# Patient Record
Sex: Male | Born: 1945 | Race: White | Hispanic: No | Marital: Married | Smoking: Never smoker
Health system: Southern US, Community
[De-identification: ages and names within clinical notes are randomized; demographics above are authoritative.]

## PROBLEM LIST (undated history)

## (undated) DIAGNOSIS — M51369 Other intervertebral disc degeneration, lumbar region without mention of lumbar back pain or lower extremity pain: Secondary | ICD-10-CM

## (undated) DIAGNOSIS — I10 Essential (primary) hypertension: Secondary | ICD-10-CM

## (undated) DIAGNOSIS — M5136 Other intervertebral disc degeneration, lumbar region: Secondary | ICD-10-CM

## (undated) DIAGNOSIS — I2510 Atherosclerotic heart disease of native coronary artery without angina pectoris: Secondary | ICD-10-CM

## (undated) DIAGNOSIS — N400 Enlarged prostate without lower urinary tract symptoms: Secondary | ICD-10-CM

## (undated) DIAGNOSIS — E785 Hyperlipidemia, unspecified: Secondary | ICD-10-CM

## (undated) DIAGNOSIS — K635 Polyp of colon: Secondary | ICD-10-CM

## (undated) DIAGNOSIS — E119 Type 2 diabetes mellitus without complications: Secondary | ICD-10-CM

## (undated) DIAGNOSIS — L570 Actinic keratosis: Secondary | ICD-10-CM

## (undated) DIAGNOSIS — K219 Gastro-esophageal reflux disease without esophagitis: Secondary | ICD-10-CM

## (undated) DIAGNOSIS — I700 Atherosclerosis of aorta: Secondary | ICD-10-CM

## (undated) DIAGNOSIS — N319 Neuromuscular dysfunction of bladder, unspecified: Secondary | ICD-10-CM

## (undated) DIAGNOSIS — K449 Diaphragmatic hernia without obstruction or gangrene: Secondary | ICD-10-CM

## (undated) DIAGNOSIS — N183 Chronic kidney disease, stage 3 unspecified: Secondary | ICD-10-CM

## (undated) DIAGNOSIS — K760 Fatty (change of) liver, not elsewhere classified: Secondary | ICD-10-CM

## (undated) DIAGNOSIS — H532 Diplopia: Secondary | ICD-10-CM

## (undated) DIAGNOSIS — H5034 Intermittent alternating exotropia: Secondary | ICD-10-CM

## (undated) DIAGNOSIS — M1990 Unspecified osteoarthritis, unspecified site: Secondary | ICD-10-CM

## (undated) HISTORY — PX: CHOLECYSTECTOMY: SHX55

## (undated) HISTORY — PX: EYE SURGERY: SHX253

## (undated) HISTORY — DX: Polyp of colon: K63.5

## (undated) HISTORY — DX: Actinic keratosis: L57.0

---
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
## 1980-04-02 HISTORY — PX: GASTRIC RESTRICTION SURGERY: SHX653

## 1980-04-02 HISTORY — PX: OTHER SURGICAL HISTORY: SHX169

## 1998-04-02 HISTORY — PX: EYE SURGERY: SHX253

## 2004-04-15 ENCOUNTER — Ambulatory Visit: Payer: Self-pay | Admitting: Internal Medicine

## 2006-08-01 DIAGNOSIS — D239 Other benign neoplasm of skin, unspecified: Secondary | ICD-10-CM

## 2006-08-01 HISTORY — DX: Other benign neoplasm of skin, unspecified: D23.9

## 2009-07-13 ENCOUNTER — Ambulatory Visit: Payer: Self-pay | Admitting: Gastroenterology

## 2009-12-20 ENCOUNTER — Ambulatory Visit: Payer: Self-pay | Admitting: Internal Medicine

## 2010-02-09 ENCOUNTER — Ambulatory Visit: Payer: Self-pay | Admitting: Pain Medicine

## 2010-03-21 ENCOUNTER — Ambulatory Visit: Payer: Self-pay | Admitting: Pain Medicine

## 2013-10-01 DIAGNOSIS — N138 Other obstructive and reflux uropathy: Secondary | ICD-10-CM | POA: Insufficient documentation

## 2013-10-01 DIAGNOSIS — N401 Enlarged prostate with lower urinary tract symptoms: Secondary | ICD-10-CM

## 2013-10-01 DIAGNOSIS — E119 Type 2 diabetes mellitus without complications: Secondary | ICD-10-CM | POA: Insufficient documentation

## 2013-10-01 DIAGNOSIS — M4800 Spinal stenosis, site unspecified: Secondary | ICD-10-CM | POA: Insufficient documentation

## 2013-10-01 DIAGNOSIS — I10 Essential (primary) hypertension: Secondary | ICD-10-CM | POA: Insufficient documentation

## 2014-07-14 ENCOUNTER — Ambulatory Visit
Admit: 2014-07-14 | Disposition: A | Discharge status: Home / Self Care | Payer: Self-pay | Attending: Physical Medicine and Rehabilitation | Admitting: Physical Medicine and Rehabilitation

## 2014-10-19 DIAGNOSIS — M5116 Intervertebral disc disorders with radiculopathy, lumbar region: Secondary | ICD-10-CM | POA: Insufficient documentation

## 2014-10-19 DIAGNOSIS — M48061 Spinal stenosis, lumbar region without neurogenic claudication: Secondary | ICD-10-CM | POA: Insufficient documentation

## 2014-10-19 DIAGNOSIS — M5136 Other intervertebral disc degeneration, lumbar region: Secondary | ICD-10-CM | POA: Insufficient documentation

## 2015-02-04 ENCOUNTER — Other Ambulatory Visit: Payer: Self-pay | Admitting: Neurosurgery

## 2015-02-04 DIAGNOSIS — G9519 Other vascular myelopathies: Secondary | ICD-10-CM

## 2015-02-04 DIAGNOSIS — M48062 Spinal stenosis, lumbar region with neurogenic claudication: Secondary | ICD-10-CM

## 2015-03-21 ENCOUNTER — Ambulatory Visit
Admission: RE | Admit: 2015-03-21 | Discharge: 2015-03-21 | Disposition: A | Discharge status: Home / Self Care | Payer: Federal, State, Local not specified - PPO | Source: Ambulatory Visit | Attending: Neurosurgery | Admitting: Neurosurgery

## 2015-03-21 VITALS — BP 146/81 | HR 83

## 2015-03-21 DIAGNOSIS — G9519 Other vascular myelopathies: Secondary | ICD-10-CM

## 2015-03-21 DIAGNOSIS — M48061 Spinal stenosis, lumbar region without neurogenic claudication: Secondary | ICD-10-CM

## 2015-03-21 DIAGNOSIS — M48062 Spinal stenosis, lumbar region with neurogenic claudication: Secondary | ICD-10-CM

## 2015-03-21 MED ORDER — DIAZEPAM 5 MG PO TABS
5.0000 mg | ORAL_TABLET | Freq: Once | ORAL | Status: AC
  Administered 2015-03-21: 5 mg via ORAL

## 2015-03-21 MED ORDER — MEPERIDINE HCL 100 MG/ML IJ SOLN
75.0000 mg | Freq: Once | INTRAMUSCULAR | Status: AC
  Administered 2015-03-21: 75 mg via INTRAMUSCULAR

## 2015-03-21 MED ORDER — ONDANSETRON HCL 4 MG/2ML IJ SOLN
4.0000 mg | Freq: Once | INTRAMUSCULAR | Status: AC
  Administered 2015-03-21: 4 mg via INTRAMUSCULAR

## 2015-03-21 MED ORDER — IOHEXOL 180 MG/ML  SOLN
15.0000 mL | Freq: Once | INTRAMUSCULAR | Status: AC | PRN
Start: 2015-03-21 — End: 2015-03-21
  Administered 2015-03-21: 15 mL via INTRATHECAL

## 2015-03-21 NOTE — Discharge Instructions (Signed)

## 2015-04-03 HISTORY — PX: BACK SURGERY: SHX140

## 2015-04-14 ENCOUNTER — Other Ambulatory Visit: Payer: Self-pay | Admitting: Neurosurgery

## 2015-04-27 ENCOUNTER — Encounter (HOSPITAL_COMMUNITY): Payer: Self-pay

## 2015-04-27 ENCOUNTER — Other Ambulatory Visit (HOSPITAL_COMMUNITY): Payer: Self-pay | Admitting: *Deleted

## 2015-04-27 ENCOUNTER — Encounter (HOSPITAL_COMMUNITY)
Admission: RE | Admit: 2015-04-27 | Discharge: 2015-04-27 | Disposition: A | Discharge status: Home / Self Care | Payer: Medicare Other | Source: Ambulatory Visit | Attending: Physical Medicine and Rehabilitation | Admitting: Physical Medicine and Rehabilitation

## 2015-04-27 DIAGNOSIS — M4316 Spondylolisthesis, lumbar region: Secondary | ICD-10-CM | POA: Diagnosis not present

## 2015-04-27 DIAGNOSIS — Z01818 Encounter for other preprocedural examination: Secondary | ICD-10-CM | POA: Diagnosis not present

## 2015-04-27 DIAGNOSIS — I491 Atrial premature depolarization: Secondary | ICD-10-CM | POA: Insufficient documentation

## 2015-04-27 DIAGNOSIS — Z01812 Encounter for preprocedural laboratory examination: Secondary | ICD-10-CM | POA: Insufficient documentation

## 2015-04-27 DIAGNOSIS — Z0183 Encounter for blood typing: Secondary | ICD-10-CM | POA: Diagnosis not present

## 2015-04-27 HISTORY — DX: Essential (primary) hypertension: I10

## 2015-04-27 HISTORY — DX: Type 2 diabetes mellitus without complications: E11.9

## 2015-04-27 LAB — BASIC METABOLIC PANEL
Anion gap: 6 (ref 5–15)
BUN: 13 mg/dL (ref 6–20)
CO2: 27 mmol/L (ref 22–32)
Calcium: 9.1 mg/dL (ref 8.9–10.3)
Chloride: 103 mmol/L (ref 101–111)
Creatinine, Ser: 1 mg/dL (ref 0.61–1.24)
GFR calc Af Amer: >60 mL/min (ref >60)
GFR calc non Af Amer: >60 mL/min (ref >60)
Glucose, Bld: 196 mg/dL — ABNORMAL HIGH (ref 65–99)
Potassium: 4.5 mmol/L (ref 3.5–5.1)
Sodium: 136 mmol/L (ref 135–145)

## 2015-04-27 LAB — SURGICAL PCR SCREEN
MRSA, PCR: NEGATIVE
Staphylococcus aureus: NEGATIVE

## 2015-04-27 LAB — TYPE AND SCREEN
ABO/RH(D): O POS
Antibody Screen: NEGATIVE

## 2015-04-27 LAB — CBC
HCT: 45.9 % (ref 39.0–52.0)
Hemoglobin: 15.2 g/dL (ref 13.0–17.0)
MCH: 30.2 pg (ref 26.0–34.0)
MCHC: 33.1 g/dL (ref 30.0–36.0)
MCV: 91.3 fL (ref 78.0–100.0)
Platelets: 170 10*3/uL (ref 150–400)
RBC: 5.03 MIL/uL (ref 4.22–5.81)
RDW: 12.9 % (ref 11.5–15.5)
WBC: 5.6 10*3/uL (ref 4.0–10.5)

## 2015-04-27 LAB — ABO/RH: ABO/RH(D): O POS

## 2015-04-27 LAB — GLUCOSE, CAPILLARY: Glucose-Capillary: 228 mg/dL — ABNORMAL HIGH (ref 65–99)

## 2015-04-27 NOTE — Pre-Procedure Instructions (Signed)
Calvin Roth  04/27/2015      [id number] - [place], [place] AT [facility] OF [address] [place] [address] Phone: [phone] Fax: [phone]    Your procedure is scheduled on 05-04-2015   Wednesday   Report to [facility] Admitting at 7:30 A.M.   Call this number if you have problems the morning of surgery:  [phone]   Remember:  Do not eat food or drink liquids after midnight.   Take these medicines the morning of surgery Levemir insulin  16 units AM  And 16 units PM                                           How to Manage Your Diabetes Before Surgery   Why is it important to control my blood sugar before and after surgery?   Improving blood sugar levels before and after surgery helps healing and can limit problems.  A way of improving blood sugar control is eating a healthy diet by:  - Eating less sugar and carbohydrates  - Increasing activity/exercise  - Talk with your doctor about reaching your blood sugar goals  High blood sugars (greater than 180 mg/dL) can raise your risk of infections and slow down your recovery so you will need to focus on controlling your diabetes during the weeks before surgery.  Make sure that the doctor who takes care of your diabetes knows about your planned surgery including the date and location.  How do I manage my blood sugars before surgery?   Check your blood sugar at least 4 times a day, 2 days before surgery to make sure that they are not too high or low.   Check your blood sugar the morning of your surgery when you wake up and every 2               hours until you get to the Short-Stay unit.  If your blood sugar is less than 70 mg/dL, you will need to treat for low blood sugar by:  Treat a low blood sugar (less than 70 mg/dL) with 1/2 cup of clear juice (cranberry or apple), 4 glucose tablets, OR glucose gel.  Recheck blood sugar in 15 minutes after  treatment (to make sure it is greater than 70 mg/dL).  If blood sugar is not greater than 70 mg/dL on re-check, call [phone] for further instructions.   Report your blood sugar to the Short-Stay nurse when you get to Short-Stay.  References:  [facility], 2007 "How to Manage your Diabetes Before and After Surgery".  What do I do about my diabetes medications?   Do not take oral diabetes medicines (pills) the morning of surgery.    THE NIGHT BEFORE SURGERY, take 16 units  Levemir of  Insulin.    THE MORNING OF SURGERY, take 16 units of Levemir Insulin.    Do not take other diabetes injectables the day of surgery including Byetta, Victoza, Bydureon, and Trulicity.    .            Do not wear jewelry,  Do not wear lotions, powders, or perfumes.  You may not wear deodorant.  Do not shave 48 hours prior to surgery.  Men may shave face and neck.  Do not bring valuables to the hospital.  [facility] is not responsible for any belongings or valuables.  Contacts, dentures or bridgework may not be worn into surgery.  Leave your suitcase in the car.  After surgery it may be brought to your room.  For patients admitted to the hospital, discharge time will be determined by your treatment team.  Patients discharged the day of surgery will not be allowed to drive home.    Special instructions:  See attached sheet for instructions on CHG showers  Please read over the following fact sheets that you were given. Pain Booklet, MRSA Information and Surgical Site Infection Prevention

## 2015-04-28 LAB — HEMOGLOBIN A1C
Hgb A1c MFr Bld: 9.3 % — ABNORMAL HIGH (ref 4.8–5.6)
Mean Plasma Glucose: 220 mg/dL

## 2015-05-03 MED ORDER — DEXTROSE 5 % IV SOLN
3.0000 g | INTRAVENOUS | Status: AC
  Administered 2015-05-04: 3 g via INTRAVENOUS
  Filled 2015-05-03 (×2): qty 3000

## 2015-05-04 ENCOUNTER — Encounter (HOSPITAL_COMMUNITY)
Admission: RE | Disposition: A | Discharge status: Home / Self Care | Payer: Self-pay | Source: Ambulatory Visit | Attending: Neurosurgery

## 2015-05-04 ENCOUNTER — Inpatient Hospital Stay (HOSPITAL_COMMUNITY)
Admission: RE | Admit: 2015-05-04 | Discharge: 2015-05-07 | DRG: 460 | Disposition: A | Discharge status: Home / Self Care | Payer: Medicare Other | Source: Ambulatory Visit | Attending: Neurosurgery | Admitting: Neurosurgery

## 2015-05-04 ENCOUNTER — Encounter (HOSPITAL_COMMUNITY): Payer: Self-pay | Admitting: *Deleted

## 2015-05-04 ENCOUNTER — Inpatient Hospital Stay (HOSPITAL_COMMUNITY): Payer: Medicare Other | Admitting: Anesthesiology

## 2015-05-04 ENCOUNTER — Inpatient Hospital Stay (HOSPITAL_COMMUNITY): Payer: Medicare Other

## 2015-05-04 DIAGNOSIS — Z419 Encounter for procedure for purposes other than remedying health state, unspecified: Secondary | ICD-10-CM

## 2015-05-04 DIAGNOSIS — Z9884 Bariatric surgery status: Secondary | ICD-10-CM | POA: Diagnosis not present

## 2015-05-04 DIAGNOSIS — M4316 Spondylolisthesis, lumbar region: Principal | ICD-10-CM | POA: Diagnosis present

## 2015-05-04 DIAGNOSIS — Z79899 Other long term (current) drug therapy: Secondary | ICD-10-CM

## 2015-05-04 DIAGNOSIS — Z7982 Long term (current) use of aspirin: Secondary | ICD-10-CM | POA: Diagnosis not present

## 2015-05-04 DIAGNOSIS — E119 Type 2 diabetes mellitus without complications: Secondary | ICD-10-CM | POA: Diagnosis present

## 2015-05-04 DIAGNOSIS — I10 Essential (primary) hypertension: Secondary | ICD-10-CM | POA: Diagnosis present

## 2015-05-04 DIAGNOSIS — M549 Dorsalgia, unspecified: Secondary | ICD-10-CM | POA: Diagnosis present

## 2015-05-04 DIAGNOSIS — M5416 Radiculopathy, lumbar region: Secondary | ICD-10-CM | POA: Diagnosis present

## 2015-05-04 DIAGNOSIS — Z6839 Body mass index (BMI) 39.0-39.9, adult: Secondary | ICD-10-CM | POA: Diagnosis not present

## 2015-05-04 DIAGNOSIS — M4806 Spinal stenosis, lumbar region: Secondary | ICD-10-CM | POA: Diagnosis present

## 2015-05-04 DIAGNOSIS — Z794 Long term (current) use of insulin: Secondary | ICD-10-CM

## 2015-05-04 HISTORY — PX: POSTERIOR LUMBAR FUSION: SHX6036

## 2015-05-04 LAB — GLUCOSE, CAPILLARY
Glucose-Capillary: 125 mg/dL — ABNORMAL HIGH (ref 65–99)
Glucose-Capillary: 110 mg/dL — ABNORMAL HIGH (ref 65–99)
Glucose-Capillary: 101 mg/dL — ABNORMAL HIGH (ref 65–99)
Glucose-Capillary: 98 mg/dL (ref 65–99)

## 2015-05-04 SURGERY — POSTERIOR LUMBAR FUSION 2 LEVEL
Anesthesia: General | Site: Spine Lumbar

## 2015-05-04 MED ORDER — SODIUM CHLORIDE 0.9 % IV SOLN
INTRAVENOUS | Status: DC | PRN
  Administered 2015-05-04: 18:00:00 via INTRAVENOUS

## 2015-05-04 MED ORDER — ROCURONIUM BROMIDE 50 MG/5ML IV SOLN
INTRAVENOUS | Status: AC
  Filled 2015-05-04: qty 1

## 2015-05-04 MED ORDER — HYDROMORPHONE HCL 1 MG/ML IJ SOLN
0.2500 mg | INTRAMUSCULAR | Status: DC | PRN
  Administered 2015-05-04 (×3): 0.5 mg via INTRAVENOUS

## 2015-05-04 MED ORDER — GLYCOPYRROLATE 0.2 MG/ML IJ SOLN
INTRAMUSCULAR | Status: AC
  Filled 2015-05-04: qty 2

## 2015-05-04 MED ORDER — PHENYLEPHRINE HCL 10 MG/ML IJ SOLN
INTRAMUSCULAR | Status: DC | PRN
  Administered 2015-05-04 (×3): 120 ug via INTRAVENOUS
  Administered 2015-05-04: 80 ug via INTRAVENOUS
  Administered 2015-05-04: 40 ug via INTRAVENOUS
  Administered 2015-05-04 (×2): 80 ug via INTRAVENOUS
  Administered 2015-05-04: 40 ug via INTRAVENOUS
  Administered 2015-05-04: 120 ug via INTRAVENOUS

## 2015-05-04 MED ORDER — SODIUM CHLORIDE 0.9 % IR SOLN
Status: DC | PRN
  Administered 2015-05-04: 15:00:00

## 2015-05-04 MED ORDER — PHENOL 1.4 % MT LIQD: 1.0000 | OROMUCOSAL | Status: DC | PRN

## 2015-05-04 MED ORDER — NEOSTIGMINE METHYLSULFATE 10 MG/10ML IV SOLN
INTRAVENOUS | Status: AC
  Filled 2015-05-04: qty 1

## 2015-05-04 MED ORDER — LOSARTAN POTASSIUM 50 MG PO TABS
100.0000 mg | ORAL_TABLET | Freq: Every day | ORAL | Status: DC
  Administered 2015-05-05 – 2015-05-06 (×2): 100 mg via ORAL
  Filled 2015-05-04 (×3): qty 2

## 2015-05-04 MED ORDER — MENTHOL 3 MG MT LOZG: 1.0000 | LOZENGE | OROMUCOSAL | Status: DC | PRN

## 2015-05-04 MED ORDER — PIOGLITAZONE HCL 15 MG PO TABS
15.0000 mg | ORAL_TABLET | Freq: Two times a day (BID) | ORAL | Status: DC
  Administered 2015-05-05 – 2015-05-07 (×4): 15 mg via ORAL
  Filled 2015-05-04 (×7): qty 1

## 2015-05-04 MED ORDER — PROPOFOL 10 MG/ML IV BOLUS
INTRAVENOUS | Status: DC | PRN
  Administered 2015-05-04: 190 mg via INTRAVENOUS

## 2015-05-04 MED ORDER — SODIUM CHLORIDE 0.9 % IJ SOLN
INTRAMUSCULAR | Status: AC
  Filled 2015-05-04: qty 10

## 2015-05-04 MED ORDER — LIDOCAINE HCL (CARDIAC) 20 MG/ML IV SOLN
INTRAVENOUS | Status: DC | PRN
  Administered 2015-05-04: 80 mg via INTRAVENOUS

## 2015-05-04 MED ORDER — DEXTROSE 5 % IV SOLN
3.0000 g | Freq: Once | INTRAVENOUS | Status: AC
  Administered 2015-05-04: 3 g via INTRAVENOUS
  Filled 2015-05-04: qty 3000

## 2015-05-04 MED ORDER — SUFENTANIL CITRATE 50 MCG/ML IV SOLN
INTRAVENOUS | Status: AC
  Filled 2015-05-04: qty 1

## 2015-05-04 MED ORDER — METFORMIN HCL 850 MG PO TABS
850.0000 mg | ORAL_TABLET | Freq: Two times a day (BID) | ORAL | Status: DC
  Administered 2015-05-05 – 2015-05-07 (×4): 850 mg via ORAL
  Filled 2015-05-04 (×7): qty 1

## 2015-05-04 MED ORDER — PROPOFOL 10 MG/ML IV BOLUS
INTRAVENOUS | Status: AC
  Filled 2015-05-04: qty 20

## 2015-05-04 MED ORDER — LACTATED RINGERS IV SOLN
INTRAVENOUS | Status: DC
  Administered 2015-05-04: 23:00:00 via INTRAVENOUS

## 2015-05-04 MED ORDER — PHENYLEPHRINE HCL 10 MG/ML IJ SOLN
10.0000 mg | INTRAVENOUS | Status: DC | PRN
  Administered 2015-05-04: 60 ug/min via INTRAVENOUS
  Administered 2015-05-04: 50 ug/min via INTRAVENOUS

## 2015-05-04 MED ORDER — MIDAZOLAM HCL 2 MG/2ML IJ SOLN
INTRAMUSCULAR | Status: AC
  Filled 2015-05-04: qty 2

## 2015-05-04 MED ORDER — LIDOCAINE HCL (CARDIAC) 20 MG/ML IV SOLN
INTRAVENOUS | Status: AC
  Filled 2015-05-04: qty 5

## 2015-05-04 MED ORDER — FENTANYL CITRATE (PF) 250 MCG/5ML IJ SOLN
INTRAMUSCULAR | Status: AC
  Filled 2015-05-04: qty 5

## 2015-05-04 MED ORDER — GABAPENTIN 400 MG PO CAPS
1200.0000 mg | ORAL_CAPSULE | Freq: Every day | ORAL | Status: DC
  Administered 2015-05-04 – 2015-05-06 (×3): 1200 mg via ORAL
  Filled 2015-05-04 (×3): qty 3

## 2015-05-04 MED ORDER — BISACODYL 10 MG RE SUPP: 10.0000 mg | Freq: Every day | RECTAL | Status: DC | PRN

## 2015-05-04 MED ORDER — LACTATED RINGERS IV SOLN
INTRAVENOUS | Status: DC
  Administered 2015-05-04 (×3): via INTRAVENOUS

## 2015-05-04 MED ORDER — SUCCINYLCHOLINE CHLORIDE 20 MG/ML IJ SOLN
INTRAMUSCULAR | Status: AC
  Filled 2015-05-04: qty 1

## 2015-05-04 MED ORDER — HYDROCHLOROTHIAZIDE 12.5 MG PO CAPS
12.5000 mg | ORAL_CAPSULE | Freq: Every day | ORAL | Status: DC
  Administered 2015-05-05 – 2015-05-06 (×2): 12.5 mg via ORAL
  Filled 2015-05-04 (×3): qty 1

## 2015-05-04 MED ORDER — ARTIFICIAL TEARS OP OINT
TOPICAL_OINTMENT | OPHTHALMIC | Status: AC
  Filled 2015-05-04: qty 3.5

## 2015-05-04 MED ORDER — LOSARTAN POTASSIUM-HCTZ 100-12.5 MG PO TABS: 1.0000 | ORAL_TABLET | Freq: Every day | ORAL | Status: DC

## 2015-05-04 MED ORDER — VANCOMYCIN HCL 1000 MG IV SOLR
INTRAVENOUS | Status: DC | PRN
  Administered 2015-05-04: 1000 mg via TOPICAL

## 2015-05-04 MED ORDER — HYDROMORPHONE HCL 1 MG/ML IJ SOLN
INTRAMUSCULAR | Status: AC
  Administered 2015-05-04: 0.5 mg via INTRAVENOUS
  Filled 2015-05-04: qty 1

## 2015-05-04 MED ORDER — SURGIFOAM 100 EX MISC
CUTANEOUS | Status: DC | PRN
  Administered 2015-05-04 (×2): via TOPICAL

## 2015-05-04 MED ORDER — ONDANSETRON HCL 4 MG/2ML IJ SOLN: 4.0000 mg | INTRAMUSCULAR | Status: DC | PRN

## 2015-05-04 MED ORDER — BUPIVACAINE LIPOSOME 1.3 % IJ SUSP
20.0000 mL | INTRAMUSCULAR | Status: AC
  Administered 2015-05-04: 20 mL
  Filled 2015-05-04: qty 20

## 2015-05-04 MED ORDER — ROCURONIUM BROMIDE 100 MG/10ML IV SOLN
INTRAVENOUS | Status: DC | PRN
  Administered 2015-05-04: 50 mg via INTRAVENOUS

## 2015-05-04 MED ORDER — VANCOMYCIN HCL 1000 MG IV SOLR
INTRAVENOUS | Status: AC
  Filled 2015-05-04: qty 1000

## 2015-05-04 MED ORDER — OXYCODONE-ACETAMINOPHEN 5-325 MG PO TABS
1.0000 | ORAL_TABLET | ORAL | Status: DC | PRN
  Administered 2015-05-05 – 2015-05-07 (×12): 2 via ORAL
  Filled 2015-05-04 (×12): qty 2

## 2015-05-04 MED ORDER — PIOGLITAZONE HCL-METFORMIN HCL 15-850 MG PO TABS: 1.0000 | ORAL_TABLET | Freq: Two times a day (BID) | ORAL | Status: DC

## 2015-05-04 MED ORDER — BUPIVACAINE-EPINEPHRINE (PF) 0.5% -1:200000 IJ SOLN
INTRAMUSCULAR | Status: DC | PRN
  Administered 2015-05-04: 10 mL

## 2015-05-04 MED ORDER — MIDAZOLAM HCL 5 MG/5ML IJ SOLN
INTRAMUSCULAR | Status: DC | PRN
  Administered 2015-05-04 (×2): 1 mg via INTRAVENOUS

## 2015-05-04 MED ORDER — HYDROMORPHONE HCL 1 MG/ML IJ SOLN
INTRAMUSCULAR | Status: AC
Start: 2015-05-04 — End: 2015-05-04
  Administered 2015-05-04: 0.5 mg via INTRAVENOUS
  Filled 2015-05-04: qty 1

## 2015-05-04 MED ORDER — EPHEDRINE SULFATE 50 MG/ML IJ SOLN
INTRAMUSCULAR | Status: DC | PRN
  Administered 2015-05-04: 10 mg via INTRAVENOUS

## 2015-05-04 MED ORDER — MORPHINE SULFATE (PF) 2 MG/ML IV SOLN
1.0000 mg | INTRAVENOUS | Status: DC | PRN
  Administered 2015-05-05 (×2): 2 mg via INTRAVENOUS
  Filled 2015-05-04 (×2): qty 1

## 2015-05-04 MED ORDER — DIAZEPAM 5 MG PO TABS: 5.0000 mg | ORAL_TABLET | Freq: Four times a day (QID) | ORAL | Status: DC | PRN

## 2015-05-04 MED ORDER — BACITRACIN ZINC 500 UNIT/GM EX OINT
TOPICAL_OINTMENT | CUTANEOUS | Status: DC | PRN
  Administered 2015-05-04: 1 via TOPICAL

## 2015-05-04 MED ORDER — ATORVASTATIN CALCIUM 10 MG PO TABS
10.0000 mg | ORAL_TABLET | Freq: Every day | ORAL | Status: DC
Start: 2015-05-05 — End: 2015-05-07
  Administered 2015-05-05 – 2015-05-06 (×2): 10 mg via ORAL
  Filled 2015-05-04 (×2): qty 1

## 2015-05-04 MED ORDER — 0.9 % SODIUM CHLORIDE (POUR BTL) OPTIME
TOPICAL | Status: DC | PRN
  Administered 2015-05-04: 1000 mL

## 2015-05-04 MED ORDER — ONDANSETRON HCL 4 MG/2ML IJ SOLN
INTRAMUSCULAR | Status: DC | PRN
Start: 2015-05-04 — End: 2015-05-04
  Administered 2015-05-04: 4 mg via INTRAVENOUS

## 2015-05-04 MED ORDER — ACETAMINOPHEN 650 MG RE SUPP: 650.0000 mg | RECTAL | Status: DC | PRN

## 2015-05-04 MED ORDER — DOCUSATE SODIUM 100 MG PO CAPS
100.0000 mg | ORAL_CAPSULE | Freq: Two times a day (BID) | ORAL | Status: DC
Start: 2015-05-04 — End: 2015-05-07
  Administered 2015-05-04 – 2015-05-07 (×6): 100 mg via ORAL
  Filled 2015-05-04 (×6): qty 1

## 2015-05-04 MED ORDER — ARTIFICIAL TEARS OP OINT
TOPICAL_OINTMENT | OPHTHALMIC | Status: DC | PRN
  Administered 2015-05-04: 1 via OPHTHALMIC

## 2015-05-04 MED ORDER — ONDANSETRON HCL 4 MG/2ML IJ SOLN
INTRAMUSCULAR | Status: AC
  Filled 2015-05-04: qty 2

## 2015-05-04 MED ORDER — ALUM & MAG HYDROXIDE-SIMETH 200-200-20 MG/5ML PO SUSP: 30.0000 mL | Freq: Four times a day (QID) | ORAL | Status: DC | PRN

## 2015-05-04 MED ORDER — ACETAMINOPHEN 325 MG PO TABS: 650.0000 mg | ORAL_TABLET | ORAL | Status: DC | PRN

## 2015-05-04 MED ORDER — PROMETHAZINE HCL 25 MG/ML IJ SOLN: 6.2500 mg | INTRAMUSCULAR | Status: DC | PRN

## 2015-05-04 MED ORDER — ALBUMIN HUMAN 5 % IV SOLN
INTRAVENOUS | Status: DC | PRN
  Administered 2015-05-04 (×2): via INTRAVENOUS

## 2015-05-04 MED ORDER — CEFAZOLIN SODIUM-DEXTROSE 2-3 GM-% IV SOLR
2.0000 g | Freq: Three times a day (TID) | INTRAVENOUS | Status: AC
  Administered 2015-05-04 – 2015-05-05 (×2): 2 g via INTRAVENOUS
  Filled 2015-05-04 (×2): qty 50

## 2015-05-04 MED ORDER — ARTIFICIAL TEARS OP OINT
TOPICAL_OINTMENT | OPHTHALMIC | Status: AC
Start: 2015-05-04 — End: 2015-05-04
  Filled 2015-05-04: qty 3.5

## 2015-05-04 MED ORDER — HYDROCODONE-ACETAMINOPHEN 5-325 MG PO TABS: 1.0000 | ORAL_TABLET | ORAL | Status: DC | PRN

## 2015-05-04 MED ORDER — FENTANYL CITRATE (PF) 100 MCG/2ML IJ SOLN
INTRAMUSCULAR | Status: DC | PRN
  Administered 2015-05-04: 100 ug via INTRAVENOUS
  Administered 2015-05-04 (×3): 50 ug via INTRAVENOUS

## 2015-05-04 SURGICAL SUPPLY — 60 items
BAG DECANTER FOR FLEXI CONT (MISCELLANEOUS) ×2 IMPLANT
BENZOIN TINCTURE PRP APPL 2/3 (GAUZE/BANDAGES/DRESSINGS) ×2 IMPLANT
BLADE CLIPPER SURG (BLADE) IMPLANT
BRUSH SCRUB EZ PLAIN DRY (MISCELLANEOUS) ×2 IMPLANT
BUR MATCHSTICK NEURO 3.0 LAGG (BURR) ×2 IMPLANT
BUR PRECISION FLUTE 6.0 (BURR) ×2 IMPLANT
CAGE ALTERA 10X31X10-14 15D (Cage) ×2 IMPLANT
CAGE ALTERA 10X31X9-13 15D (Cage) ×2 IMPLANT
CANISTER SUCT 3000ML PPV (MISCELLANEOUS) ×2 IMPLANT
CAP REVERE LOCKING (Cap) ×12 IMPLANT
CONN CROSSLINK REV 6.35 48-60 (Connector) ×2 IMPLANT
CONNECTOR CRSLNK REV6.35 48-60 (Connector) ×1 IMPLANT
CONT SPEC 4OZ CLIKSEAL STRL BL (MISCELLANEOUS) ×2 IMPLANT
COVER BACK TABLE 60X90IN (DRAPES) ×2 IMPLANT
DRAPE C-ARM 42X72 X-RAY (DRAPES) ×4 IMPLANT
DRAPE LAPAROTOMY 100X72X124 (DRAPES) ×2 IMPLANT
DRAPE POUCH INSTRU U-SHP 10X18 (DRAPES) ×2 IMPLANT
DRAPE PROXIMA HALF (DRAPES) ×2 IMPLANT
DRAPE SURG 17X23 STRL (DRAPES) ×8 IMPLANT
ELECT BLADE 4.0 EZ CLEAN MEGAD (MISCELLANEOUS) ×2
ELECT REM PT RETURN 9FT ADLT (ELECTROSURGICAL) ×2
ELECTRODE BLDE 4.0 EZ CLN MEGD (MISCELLANEOUS) ×1 IMPLANT
ELECTRODE REM PT RTRN 9FT ADLT (ELECTROSURGICAL) ×1 IMPLANT
GAUZE SPONGE 4X4 12PLY STRL (GAUZE/BANDAGES/DRESSINGS) ×2 IMPLANT
GAUZE SPONGE 4X4 16PLY XRAY LF (GAUZE/BANDAGES/DRESSINGS) ×2 IMPLANT
GLOVE BIO SURGEON STRL SZ8 (GLOVE) ×4 IMPLANT
GLOVE BIO SURGEON STRL SZ8.5 (GLOVE) ×4 IMPLANT
GLOVE EXAM NITRILE LRG STRL (GLOVE) IMPLANT
GLOVE EXAM NITRILE MD LF STRL (GLOVE) IMPLANT
GLOVE EXAM NITRILE XL STR (GLOVE) IMPLANT
GLOVE EXAM NITRILE XS STR PU (GLOVE) IMPLANT
GOWN STRL REUS W/ TWL LRG LVL3 (GOWN DISPOSABLE) IMPLANT
GOWN STRL REUS W/ TWL XL LVL3 (GOWN DISPOSABLE) ×2 IMPLANT
GOWN STRL REUS W/TWL 2XL LVL3 (GOWN DISPOSABLE) IMPLANT
GOWN STRL REUS W/TWL LRG LVL3 (GOWN DISPOSABLE)
GOWN STRL REUS W/TWL XL LVL3 (GOWN DISPOSABLE) ×2
KIT BASIN OR (CUSTOM PROCEDURE TRAY) ×2 IMPLANT
KIT ROOM TURNOVER OR (KITS) ×2 IMPLANT
NEEDLE HYPO 21X1.5 SAFETY (NEEDLE) IMPLANT
NEEDLE HYPO 22GX1.5 SAFETY (NEEDLE) ×2 IMPLANT
NS IRRIG 1000ML POUR BTL (IV SOLUTION) ×2 IMPLANT
PACK LAMINECTOMY NEURO (CUSTOM PROCEDURE TRAY) ×2 IMPLANT
PAD ARMBOARD 7.5X6 YLW CONV (MISCELLANEOUS) ×6 IMPLANT
PATTIES SURGICAL .5 X1 (DISPOSABLE) IMPLANT
ROD REVERE 7.5MM (Rod) ×4 IMPLANT
SCREW 7.5X50MM (Screw) ×12 IMPLANT
SPONGE LAP 4X18 X RAY DECT (DISPOSABLE) IMPLANT
SPONGE NEURO XRAY DETECT 1X3 (DISPOSABLE) IMPLANT
SPONGE SURGIFOAM ABS GEL 100 (HEMOSTASIS) ×2 IMPLANT
STRIP BIOACTIVE 10CC 25X50X8 (Miscellaneous) ×2 IMPLANT
STRIP BIOACTIVE 20CC 25X100X8 (Miscellaneous) ×2 IMPLANT
STRIP CLOSURE SKIN 1/2X4 (GAUZE/BANDAGES/DRESSINGS) ×2 IMPLANT
SUT VIC AB 1 CT1 18XBRD ANBCTR (SUTURE) ×2 IMPLANT
SUT VIC AB 1 CT1 8-18 (SUTURE) ×2
SUT VIC AB 2-0 CP2 18 (SUTURE) ×4 IMPLANT
TAPE CLOTH SURG 4X10 WHT LF (GAUZE/BANDAGES/DRESSINGS) ×2 IMPLANT
TOWEL OR 17X24 6PK STRL BLUE (TOWEL DISPOSABLE) ×2 IMPLANT
TOWEL OR 17X26 10 PK STRL BLUE (TOWEL DISPOSABLE) ×2 IMPLANT
TRAY FOLEY W/METER SILVER 14FR (SET/KITS/TRAYS/PACK) ×2 IMPLANT
WATER STERILE IRR 1000ML POUR (IV SOLUTION) ×2 IMPLANT

## 2015-05-04 NOTE — Op Note (Signed)
Brief history: The patient is a 70 year old white male who has complained of back, buttock, and leg pain consistent with neurogenic claudication. He has failed medical management and was worked up with a lumbar myelo CT. This demonstrated an L3-4 and L4-5 spondylolisthesis, facet arthropathy, spinal stenosis, etc. I discussed the various treatment options with the patient including surgery. The patient has weighed the risks, benefits, and alternative surgery and decided proceed with an L3-4 and L4-5 decompression, instrumentation, and fusion.  Preoperative diagnosis: L3-4 and L4-5 spondylolisthesis, facet arthropathy, spinal stenosis compressing the L3, L4 and L5 nerve roots; lumbago; lumbar radiculopathy; Neurogenic claudication  Postoperative diagnosis: The same  Procedure: L4 laminectomy with bilateral L3 Laminotomy/foraminotomies to decompress the bilateral L3, L4 and L5 nerve roots(the work required to do this was in addition to the work required to do the posterior lumbar interbody fusion because of the patient's spinal stenosis, facet arthropathy. Etc. requiring a wide decompression of the nerve roots.); L3-4 and L4-5 posterior lumbar interbody fusion with local morselized autograft bone and Kinnex graft extender; insertion of interbody prosthesis at L3-4 and L4-5 (globus peek expandable interbody prosthesis); posterior segmental instrumentation from L3 to L5 with globus titanium pedicle screws and rods; posterior lateral arthrodesis at L3-4 and L4-5 with local morselized autograft bone and Kinnex bone graft extender.  Surgeon: Dr. Earle Gell  Asst.: Dr. Francesca Jewett  Anesthesia: Gen. endotracheal  Estimated blood loss: 300 mL  Drains: One medium Hemovac  Complications: None  Description of procedure: The patient was brought to the operating room by the anesthesia team. General endotracheal anesthesia was induced. The patient was turned to the prone position on the Wilson frame. The  patient's lumbosacral region was then prepared with Betadine scrub and Betadine solution. Sterile drapes were applied.  I then injected the area to be incised with Marcaine with epinephrine solution. I then used the scalpel to make a linear midline incision over the L3-4 and L4-5 interspace. I then used electrocautery to perform a bilateral subperiosteal dissection exposing the spinous process and lamina of L3, L4 and L5. We then obtained intraoperative radiograph to confirm our location. We then inserted the Verstrac retractor to provide exposure.  I began the decompression by using the high speed drill to perform laminotomies at L3-4 and L4-5 bilaterally. We then used the Kerrison punches to complete the L4 laminectomy and to widen the laminotomy and removed the ligamentum flavum at L3-4 and L4-5. We used the Kerrison punches to remove the medial facets at L3-4 and L4-5 bilaterally. We performed wide foraminotomies about the bilateral L3, L4 and L5 nerve roots completing the decompression.  We now turned our attention to the posterior lumbar interbody fusion. I used a scalpel to incise the intervertebral disc at L3-4 and L4-5 bilaterally. I then performed a partial intervertebral discectomy at L3-4 and L4-5 bilaterally using the pituitary forceps. We prepared the vertebral endplates at [id number] and [id number] bilaterally for the fusion by removing the soft tissues with the curettes. We then used the trial spacers to pick the appropriate sized interbody prosthesis. We prefilled his prosthesis with a combination of local morselized autograft bone that we obtained during the decompression as well as Kinnex bone graft extender. We inserted the prefilled prosthesis into the interspace at L3-4 and L4-5, we then expanded the prosthesis. There was a good snug fit of the prosthesis in the interspace. We then filled and the remainder of the intervertebral disc space with local morselized autograft bone and Kinnex. This  completed the  posterior lumbar interbody arthrodesis.  We now turned attention to the instrumentation. Under fluoroscopic guidance we cannulated the bilateral L3, L4 and L5 pedicles with the bone probe. We then removed the bone probe. We then tapped the pedicle with a 6.5 millimeter tap. We then removed the tap. We probed inside the tapped pedicle with a ball probe to rule out cortical breaches. We then inserted a 7.5 x 50 millimeter pedicle screw into the L3, L4 and L5 pedicles bilaterally under fluoroscopic guidance. We then palpated along the medial aspect of the pedicles to rule out cortical breaches. There were none. The nerve roots were not injured. We then connected the unilateral pedicle screws with a lordotic rod. We compressed the construct and secured the rod in place with the caps. We then tightened the caps appropriately. We placed a cross connector between the rods. This completed the instrumentation from L3-L5.  We now turned our attention to the posterior lateral arthrodesis at L3-4 and L4-5. We used the high-speed drill to decorticate the remainder of the facets, pars, transverse process at L3-4 and L4-5. We then applied a combination of local morselized autograft bone and Kinnex bone graft extender over these decorticated posterior lateral structures. This completed the posterior lateral arthrodesis.  We then obtained hemostasis using bipolar electrocautery. We irrigated the wound out with bacitracin solution. We inspected the thecal sac and nerve roots and noted they were well decompressed. We then removed the retractor. We placed vancomycin powder in the wound. We placed a medium Hemovac drain in the epidural space and tunneled out through separate stab wound. We reapproximated patient's thoracolumbar fascia with interrupted #1 Vicryl suture. We reapproximated patient's subcutaneous tissue with interrupted 2-0 Vicryl suture. The reapproximated patient's skin with Steri-Strips and benzoin.  The wound was then coated with bacitracin ointment. A sterile dressing was applied. The drapes were removed. The patient was subsequently returned to the supine position where they were extubated by the anesthesia team. He was then transported to the post anesthesia care unit in stable condition. All sponge instrument and needle counts were reportedly correct at the end of this case.

## 2015-05-04 NOTE — Anesthesia Preprocedure Evaluation (Signed)
Anesthesia Evaluation  Patient identified by MRN, date of birth, ID band Patient awake    Reviewed: Allergy & Precautions, NPO status , Unable to perform ROS - Chart review only  Airway Mallampati: I  TM Distance: >3 FB Neck ROM: Full    Dental  (+) Teeth Intact   Pulmonary neg pulmonary ROS,    breath sounds clear to auscultation       Cardiovascular hypertension,  Rhythm:Regular Rate:Normal     Neuro/Psych  Neuromuscular disease    GI/Hepatic   Endo/Other  diabetes, Poorly Controlled, Type 1, Insulin DependentMorbid obesity  Renal/GU      Musculoskeletal  (+) Arthritis ,   Abdominal (+) + obese,   Peds  Hematology   Anesthesia Other Findings   Reproductive/Obstetrics                             Anesthesia Physical Anesthesia Plan  ASA: III  Anesthesia Plan: General   Post-op Pain Management:    Induction: Intravenous  Airway Management Planned: Oral ETT  Additional Equipment:   Intra-op Plan:   Post-operative Plan: Extubation in OR  Informed Consent: I have reviewed the patients History and Physical, chart, labs and discussed the procedure including the risks, benefits and alternatives for the proposed anesthesia with the patient or authorized representative who has indicated his/her understanding and acceptance.   Dental advisory given  Plan Discussed with: CRNA and Surgeon  Anesthesia Plan Comments:         Anesthesia Quick Evaluation

## 2015-05-04 NOTE — Transfer of Care (Signed)
Immediate Anesthesia Transfer of Care Note  Patient: SAKAI LEAZER  Procedure(s) Performed: Procedure(s) with comments: LUMBAR THREE-FOUR, LUMBAR FOUR-FIVE POSTERIOR LUMBAR FUSION  (N/A) - L34 L45 posterior lumbar interbody fusion with interbody prosthesis posterior lateral arthrodesis and posterior segmental instrumentation  Patient Location: PACU   Anesthesia Type:General  Level of Consciousness: awake, alert , oriented and patient cooperative  Airway & Oxygen Therapy: Patient Spontanous Breathing and Patient connected to nasal cannula oxygen  Post-op Assessment: Report given to RN, Post -op Vital signs reviewed and stable and Patient moving all extremities  Post vital signs: Reviewed and stable  Last Vitals:  Filed Vitals:   05/04/15 0743 05/04/15 1820  BP: 119/88 124/73  Pulse: 90 77  Temp: 36.4 C 36.4 C  Resp:  20    Complications: No apparent anesthesia complications

## 2015-05-04 NOTE — H&P (Signed)
Subjective: The patient is a 70 year old white male who has complained of back, buttock, and leg pain consistent with a lumbar radiculopathy/neurogenic claudication. He has failed medical management and was worked up with a lumbar myelo CT. This demonstrated an L3-4 and L4-5 spinal listhesis with spinal stenosis. I discussed the various treatment options with the patient including surgery. He has decided to proceed with a lumbar decompression, instrumentation, and fusion.   Past Medical History  Diagnosis Date  . Hypertension   . Diabetes mellitus without complication [facility])     Past Surgical History  Procedure Laterality Date  . Cholecystectomy    . Stomach stapled      For weight loss    No Known Allergies  Social History  Substance Use Topics  . Smoking status: Never Smoker   . Smokeless tobacco: Not on file  . Alcohol Use: Yes     Comment: 6 beersa wek    History reviewed. No pertinent family history. Prior to Admission medications   Medication Sig Start Date End Date Taking? Authorizing Provider  aspirin EC 81 MG tablet Take by mouth.   Yes Historical Provider, MD  atorvastatin (LIPITOR) 10 MG tablet Take by mouth. 10/27/14 10/27/15 Yes Historical Provider, MD  gabapentin (NEURONTIN) 400 MG capsule Take 1,200 mg by mouth at bedtime.  10/27/14  Yes Historical Provider, MD  insulin lispro (HUMALOG) 100 UNIT/ML KiwkPen Inject 5 Units into the skin daily.  10/27/14  Yes Historical Provider, MD  LEVEMIR FLEXTOUCH 100 UNIT/ML Pen Inject 20 Units into the skin 2 (two) times daily. 03/19/15  Yes Historical Provider, MD  losartan-hydrochlorothiazide (HYZAAR) 100-12.5 MG tablet Take by mouth. 10/27/14  Yes Historical Provider, MD  pioglitazone-metformin (ACTOPLUS MET) 15-850 MG tablet Take 1 tablet by mouth 2 (two) times daily with a meal.  10/27/14  Yes Historical Provider, MD     Review of Systems  Positive ROS: As above  All other systems have been reviewed and were otherwise negative  with the exception of those mentioned in the HPI and as above.  Objective: Vital signs in last 24 hours: Temp:  [97.5 F (36.4 C)] 97.5 F (36.4 C) (02/01 0743) Pulse Rate:  [90] 90 (02/01 0743) BP: (119)/(88) 119/88 mmHg (02/01 0743) SpO2:  [95 %] 95 % (02/01 0743) Weight:  [122.471 kg (270 lb)] 122.471 kg (270 lb) (02/01 0743)  General Appearance: Alert, cooperative, no distress, Head: Normocephalic, without obvious abnormality, atraumatic Eyes: PERRL, conjunctiva/corneas clear, EOM's intact,    Ears: Normal  Throat: Normal  Neck: Supple, symmetrical, trachea midline, no adenopathy; thyroid: No enlargement/tenderness/nodules; no carotid bruit or JVD Back: Symmetric, no curvature, ROM normal, no CVA tenderness Lungs: Clear to auscultation bilaterally, respirations unlabored Heart: Regular rate and rhythm, no murmur, rub or gallop Abdomen: Soft, non-tender,, no masses, no organomegaly Extremities: Extremities normal, atraumatic, no cyanosis or edema Pulses: 2+ and symmetric all extremities Skin: Skin color, texture, turgor normal, no rashes or lesions  NEUROLOGIC:   Mental status: alert and oriented, no aphasia, good attention span, Fund of knowledge/ memory ok Motor Exam - grossly normal Sensory Exam - grossly normal Reflexes:  Coordination - grossly normal Gait - grossly normal Balance - grossly normal Cranial Nerves: I: smell Not tested  II: visual acuity  OS: Normal  OD: Normal   II: visual fields Full to confrontation  II: pupils Equal, round, reactive to light  III,VII: ptosis None  III,IV,VI: extraocular muscles  Full ROM  V: mastication Normal  V: facial light touch  sensation  Normal  V,VII: corneal reflex  Present  VII: facial muscle function - upper  Normal  VII: facial muscle function - lower Normal  VIII: hearing Not tested  IX: soft palate elevation  Normal  IX,X: gag reflex Present  XI: trapezius strength  5/5  XI: sternocleidomastoid strength 5/5   XI: neck flexion strength  5/5  XII: tongue strength  Normal    Data Review Lab Results  Component Value Date   WBC 5.6 04/27/2015   HGB 15.2 04/27/2015   HCT 45.9 04/27/2015   MCV 91.3 04/27/2015   PLT 170 04/27/2015   Lab Results  Component Value Date   NA 136 04/27/2015   K 4.5 04/27/2015   CL 103 04/27/2015   CO2 27 04/27/2015   BUN 13 04/27/2015   CREATININE 1.00 04/27/2015   GLUCOSE 196* 04/27/2015   No results found for: INR, PROTIME  Assessment/Plan: L3-4 and L4-5 spondylolisthesis, spinal stenosis, lumbago, lumbar radiculopathy, neurogenic claudication: I have discussed the situation with the patient and reviewed his imaging studies with him. We have discussed the various treatment options including surgery. I have described the surgical treatment option of an L3-4 and L4-5 decompression, is rotation, and fusion. I have shown him surgical models. We have discussed the risks, benefits, alternatives, and likelihood of achieving goals with surgery. I have answered all the patient's questions. The patient has decided proceed with surgery.   Cina Klumpp D 05/04/2015 11:43 AM

## 2015-05-04 NOTE — Anesthesia Postprocedure Evaluation (Signed)
Anesthesia Post Note  Patient: Calvin Roth  Procedure(s) Performed: Procedure(s) (LRB): LUMBAR THREE-FOUR, LUMBAR FOUR-FIVE POSTERIOR LUMBAR FUSION  (N/A)  Patient location during evaluation: PACU Anesthesia Type: General Level of consciousness: awake and alert Pain management: pain level controlled Vital Signs Assessment: post-procedure vital signs reviewed and stable Respiratory status: spontaneous breathing, nonlabored ventilation, respiratory function stable and patient connected to nasal cannula oxygen Cardiovascular status: blood pressure returned to baseline and stable Postop Assessment: no signs of nausea or vomiting Anesthetic complications: no    Last Vitals:  Filed Vitals:   05/04/15 0743 05/04/15 1820  BP: 119/88 124/73  Pulse: 90 77  Temp: 36.4 C 36.4 C  Resp:  20    Last Pain:  Filed Vitals:   05/04/15 1826  PainSc: 6                  Aracelli Woloszyn S

## 2015-05-04 NOTE — Progress Notes (Signed)
Subjective:  The patient is alert and pleasant. He is in no apparent distress. He looks well.  Objective: Vital signs in last 24 hours: Temp:  [97.5 F (36.4 C)] 97.5 F (36.4 C) (02/01 1820) Pulse Rate:  [77-90] 77 (02/01 1820) Resp:  [20] 20 (02/01 1820) BP: (119-124)/(73-88) 124/73 mmHg (02/01 1820) SpO2:  [95 %-98 %] 98 % (02/01 1820) Weight:  [122.471 kg (270 lb)] 122.471 kg (270 lb) (02/01 0743)  Intake/Output from previous day:   Intake/Output this shift: Total I/O In: 3330 [I.V.:2650; Blood:180; IV Piggyback:500] Out: 2405 [Urine:1905; Blood:500]  Physical exam the patient is alert and pleasant. He is moving his lower extremities well.  Lab Results: No results for input(s): WBC, HGB, HCT, PLT in the last 72 hours. BMET No results for input(s): NA, K, CL, CO2, GLUCOSE, BUN, CREATININE, CALCIUM in the last 72 hours.  Studies/Results: Dg Lumbar Spine 2-3 Views  05/04/2015  CLINICAL DATA:  Status post lumbar fusion EXAM: DG C-ARM 61-120 MIN; LUMBAR SPINE - 2-3 VIEW COMPARISON:  03/21/2015 FINDINGS: Intraoperative radiographs show pedicle screws at L3 through L5. Interbody cage material is identified at L3-4 and L4-5. IMPRESSION: 1. Intraoperative radiographs demonstrate placement of pedicle screws and interbody spacer at L3 through L5. Electronically Signed   By: Kerby Moors M.D.   On: 05/04/2015 18:09   Dg Lumbar Spine 1 View  05/04/2015  CLINICAL DATA:  70 year old male undergoing L3-L5 posterior lumbar interbody fusion EXAM: LUMBAR SPINE - 1 VIEW COMPARISON:  None. FINDINGS: Single cross-table lateral radiograph demonstrates soft tissue spreaders in the tissues posteriorly along with a linear radiopacity consistent with a surgical sponge. A metallic probe is present at the inferior articulating process of L4. IMPRESSION: Intraoperative localization radiographs as above. Electronically Signed   By: Jacqulynn Cadet M.D.   On: 05/04/2015 15:53   Dg C-arm 1-60 Min  05/04/2015   CLINICAL DATA:  Status post lumbar fusion EXAM: DG C-ARM 61-120 MIN; LUMBAR SPINE - 2-3 VIEW COMPARISON:  03/21/2015 FINDINGS: Intraoperative radiographs show pedicle screws at L3 through L5. Interbody cage material is identified at L3-4 and L4-5. IMPRESSION: 1. Intraoperative radiographs demonstrate placement of pedicle screws and interbody spacer at L3 through L5. Electronically Signed   By: Kerby Moors M.D.   On: 05/04/2015 18:09    Assessment/Plan: The patient is doing well. I spoke with his wife.  LOS: 0 days     Chelsye Suhre D 05/04/2015, 6:47 PM

## 2015-05-04 NOTE — Anesthesia Procedure Notes (Signed)
Procedure Name: Intubation Date/Time: 05/04/2015 1:35 PM Performed by: Layla Maw Pre-anesthesia Checklist: Patient identified, Patient being monitored, Timeout performed, Emergency Drugs available and Suction available Patient Re-evaluated:Patient Re-evaluated prior to inductionOxygen Delivery Method: Circle System Utilized Preoxygenation: Pre-oxygenation with 100% oxygen Intubation Type: IV induction Ventilation: Mask ventilation without difficulty Laryngoscope Size: Miller and 3 Grade View: Grade I Tube type: Oral Tube size: 8.0 mm Number of attempts: 1 Airway Equipment and Method: Stylet Placement Confirmation: ETT inserted through vocal cords under direct vision,  positive ETCO2 and breath sounds checked- equal and bilateral Secured at: 21 cm Tube secured with: Tape Dental Injury: Teeth and Oropharynx as per pre-operative assessment

## 2015-05-05 LAB — BASIC METABOLIC PANEL
Anion gap: 12 (ref 5–15)
BUN: 15 mg/dL (ref 6–20)
CO2: 23 mmol/L (ref 22–32)
Calcium: 8.3 mg/dL — ABNORMAL LOW (ref 8.9–10.3)
Chloride: 101 mmol/L (ref 101–111)
Creatinine, Ser: 1.15 mg/dL (ref 0.61–1.24)
GFR calc Af Amer: >60 mL/min (ref >60)
GFR calc non Af Amer: >60 mL/min (ref >60)
Glucose, Bld: 173 mg/dL — ABNORMAL HIGH (ref 65–99)
Potassium: 4.6 mmol/L (ref 3.5–5.1)
Sodium: 136 mmol/L (ref 135–145)

## 2015-05-05 LAB — CBC
HCT: 39.8 % (ref 39.0–52.0)
Hemoglobin: 13.1 g/dL (ref 13.0–17.0)
MCH: 30.5 pg (ref 26.0–34.0)
MCHC: 32.9 g/dL (ref 30.0–36.0)
MCV: 92.6 fL (ref 78.0–100.0)
Platelets: 156 10*3/uL (ref 150–400)
RBC: 4.3 MIL/uL (ref 4.22–5.81)
RDW: 13.4 % (ref 11.5–15.5)
WBC: 9.3 10*3/uL (ref 4.0–10.5)

## 2015-05-05 LAB — GLUCOSE, CAPILLARY
Glucose-Capillary: 177 mg/dL — ABNORMAL HIGH (ref 65–99)
Glucose-Capillary: 253 mg/dL — ABNORMAL HIGH (ref 65–99)
Glucose-Capillary: 280 mg/dL — ABNORMAL HIGH (ref 65–99)
Glucose-Capillary: 271 mg/dL — ABNORMAL HIGH (ref 65–99)

## 2015-05-05 MED ORDER — INSULIN DETEMIR 100 UNIT/ML ~~LOC~~ SOLN
20.0000 [IU] | Freq: Two times a day (BID) | SUBCUTANEOUS | Status: DC
  Administered 2015-05-05 – 2015-05-06 (×3): 20 [IU] via SUBCUTANEOUS
  Filled 2015-05-05 (×6): qty 0.2

## 2015-05-05 NOTE — Progress Notes (Signed)
Inpatient Diabetes Program Recommendations  AACE/ADA: New Consensus Statement on Inpatient Glycemic Control (2015)  Target Ranges:  Prepandial:   less than 140 mg/dL      Peak postprandial:   less than 180 mg/dL (1-2 hours)      Critically ill patients:  140 - 180 mg/dL   Review of Glycemic Control:  Results for Roth, Calvin B (MRN [id number]) as of 05/05/2015 13:56  Ref. Range 05/04/2015 09:59 05/04/2015 18:23 05/04/2015 20:44 05/05/2015 06:26 05/05/2015 11:12  Glucose-Capillary Latest Ref Range: 65-99 mg/dL 110 (H) 101 (H) 98 177 (H) 253 (H)    Diabetes history: Type 2 diabetes Outpatient Diabetes medications: Levemir 20 units bid, Humalog 5 units daily Current orders for Inpatient glycemic control:  ACTOPLUS MET 15-850 tablet bid  Inpatient Diabetes Program Recommendations:    Please restart a portion of patient's home dose of insulin.  Consider Levemir 15 units bid and Novolog moderate correction tid with meals and HS.  Thanks, Jenny Simpson, RN, BC-ADM Inpatient Diabetes Coordinator Pager [phone] (8a-5p)     

## 2015-05-05 NOTE — Progress Notes (Signed)
Patient temporarily sat up on side of bed this morning. Pt given verbal instructions on back precautions.  Pt log rolls and sits up with no assistance. Could not ambulate patient because back brace was left at home. Patient's wife will bring back brace later this morning.  Foley catheter discontinue at 0645. Pt due to void.

## 2015-05-05 NOTE — Evaluation (Addendum)
Physical Therapy Evaluation Patient Details Name: Calvin Roth MRN: [id number] DOB: [birth year] Today's Date: 05/05/2015   History of Present Illness  s/p L3-5 PLIF PMHx- DM  Clinical Impression  Patient is s/p above surgery resulting in the deficits listed below (see PT Problem List).  Patient will benefit from skilled PT to increase their independence and safety with mobility (while adhering to their precautions) to allow discharge to the venue listed below.     Follow Up Recommendations Home health PT;Supervision - Intermittent    Equipment Recommendations  Rolling walker with 5" wheels    Recommendations for Other Services OT consult (pt has multiple questions re: ADLs)    Precautions / Restrictions Precautions Precautions: Back Precaution Booklet Issued: Yes (comment) Precaution Comments: Educated pt and wife Required Braces or Orthoses: Spinal Brace Spinal Brace: Lumbar corset;Applied in sitting position      Mobility  Bed Mobility Overal bed mobility: Needs Assistance Bed Mobility: Sit to Sidelying;Rolling Rolling: Supervision       Sit to sidelying: Min assist General bed mobility comments: pt with difficulty maintaining side positioning as lowering to bed (starts to roll towards his back too soon); assist with legs  Transfers Overall transfer level: Needs assistance Equipment used: Rolling walker (2 wheeled) Transfers: Sit to/from Stand Sit to Stand: Min guard         General transfer comment: vc for safe use of RW  Ambulation/Gait Ambulation/Gait assistance: Min guard Ambulation Distance (Feet): 120 Feet Assistive device: Rolling walker (2 wheeled) Gait Pattern/deviations: Step-through pattern;Decreased stride length;Trunk flexed   Gait velocity interpretation: Below normal speed for age/gender General Gait Details: vc for safe use of RW and to adhere to back precautions  Stairs            Wheelchair Mobility    Modified Rankin (Stroke  Patients Only)       Balance                                             Pertinent Vitals/Pain Pain Assessment: 0-10 Pain Score: 6  Pain Location: back and Lt ant hip Pain Descriptors / Indicators: Operative site guarding;Discomfort Pain Intervention(s): Limited activity within patient's tolerance;Monitored during session;Repositioned    Home Living Family/patient expects to be discharged to:: Private residence Living Arrangements: Spouse/significant other Available Help at Discharge: Family;Available PRN/intermittently (wife goes back to work 2/6) Type of Home: House Home Access: Stairs to enter Entrance Stairs-Rails: Horticulturist, commercial of Steps: 7 Home Layout: One level Home Equipment: Bedside commode      Prior Function Level of Independence: Independent               Hand Dominance        Extremity/Trunk Assessment   Upper Extremity Assessment: Overall WFL for tasks assessed           Lower Extremity Assessment: Overall WFL for tasks assessed      Cervical / Trunk Assessment: Other exceptions  Communication   Communication: No difficulties  Cognition Arousal/Alertness: Awake/alert Behavior During Therapy: WFL for tasks assessed/performed Overall Cognitive Status: Within Functional Limits for tasks assessed                      General Comments      Exercises        Assessment/Plan    PT Assessment Patient needs continued PT  services  PT Diagnosis Difficulty walking;Acute pain   PT Problem List Decreased activity tolerance;Decreased mobility;Decreased knowledge of use of DME;Decreased knowledge of precautions;Obesity;Pain  PT Treatment Interventions DME instruction;Gait training;Stair training;Functional mobility training;Therapeutic activities;Patient/family education   PT Goals (Current goals can be found in the Care Plan section) Acute Rehab PT Goals Patient Stated Goal: be able to be home alone for  several hours by [facility] PT Goal Formulation: With patient Time For Goal Achievement: 05/09/15 Potential to Achieve Goals: Good    Frequency Min 5X/week   Barriers to discharge        Co-evaluation               End of Session Equipment Utilized During Treatment: Gait belt;Back brace Activity Tolerance: Patient tolerated treatment well Patient left: in bed;with nursing/sitter in room (RN in to remove hemovac drain) Nurse Communication: Mobility status         Time: 1335-1403 PT Time Calculation (min) (ACUTE ONLY): 28 min   Charges:   PT Evaluation $PT Eval Low Complexity: 1 Procedure PT Treatments $Gait Training: 8-22 mins   PT G Codes:        Aylla Huffine 05/22/2015, 2:17 PM Pager [phone]

## 2015-05-05 NOTE — Progress Notes (Signed)
Patient ID: Calvin Roth, male   DOB: 07/28/1945, 70 y.o.   MRN: [id number] Subjective:  The patient is alert and pleasant. He looks well. He is in no apparent distress.  Objective: Vital signs in last 24 hours: Temp:  [97.5 F (36.4 C)-99.7 F (37.6 C)] 99.7 F (37.6 C) (02/02 0918) Pulse Rate:  [75-109] 101 (02/02 0918) Resp:  [11-21] 20 (02/02 0918) BP: (104-131)/(59-76) 121/59 mmHg (02/02 0918) SpO2:  [94 %-98 %] 95 % (02/02 0918) Weight:  [127.4 kg (280 lb 13.9 oz)] 127.4 kg (280 lb 13.9 oz) (02/01 2037)  Intake/Output from previous day: 02/01 0701 - 02/02 0700 In: 3330 [I.V.:2650; Blood:180; IV Piggyback:500] Out: 2975 [Urine:2205; Drains:270; Blood:500] Intake/Output this shift: Total I/O In: 360 [P.O.:360] Out: -   Physical exam the patient is alert and pleasant. His strength is grossly normal in his lower extremities. His dressing is clean and dry.  Lab Results:  Recent Labs  05/05/15 0406  WBC 9.3  HGB 13.1  HCT 39.8  PLT 156   BMET  Recent Labs  05/05/15 0406  NA 136  K 4.6  CL 101  CO2 23  GLUCOSE 173*  BUN 15  CREATININE 1.15  CALCIUM 8.3*    Studies/Results: Dg Lumbar Spine 2-3 Views  05/04/2015  CLINICAL DATA:  Status post lumbar fusion EXAM: DG C-ARM 61-120 MIN; LUMBAR SPINE - 2-3 VIEW COMPARISON:  03/21/2015 FINDINGS: Intraoperative radiographs show pedicle screws at L3 through L5. Interbody cage material is identified at L3-4 and L4-5. IMPRESSION: 1. Intraoperative radiographs demonstrate placement of pedicle screws and interbody spacer at L3 through L5. Electronically Signed   By: Kerby Moors M.D.   On: 05/04/2015 18:09   Dg Lumbar Spine 1 View  05/04/2015  CLINICAL DATA:  70 year old male undergoing L3-L5 posterior lumbar interbody fusion EXAM: LUMBAR SPINE - 1 VIEW COMPARISON:  None. FINDINGS: Single cross-table lateral radiograph demonstrates soft tissue spreaders in the tissues posteriorly along with a linear radiopacity consistent with  a surgical sponge. A metallic probe is present at the inferior articulating process of L4. IMPRESSION: Intraoperative localization radiographs as above. Electronically Signed   By: Jacqulynn Cadet M.D.   On: 05/04/2015 15:53   Dg C-arm 1-60 Min  05/04/2015  CLINICAL DATA:  Status post lumbar fusion EXAM: DG C-ARM 61-120 MIN; LUMBAR SPINE - 2-3 VIEW COMPARISON:  03/21/2015 FINDINGS: Intraoperative radiographs show pedicle screws at L3 through L5. Interbody cage material is identified at L3-4 and L4-5. IMPRESSION: 1. Intraoperative radiographs demonstrate placement of pedicle screws and interbody spacer at L3 through L5. Electronically Signed   By: Kerby Moors M.D.   On: 05/04/2015 18:09    Assessment/Plan: Postop day #1: The patient is doing well. We will mobilize him. He will likely go home in a day or 2. I spoke with his wife.  LOS: 1 day     Catlin Doria D 05/05/2015, 12:05 PM

## 2015-05-05 NOTE — Progress Notes (Signed)
Utilization review completed.  

## 2015-05-06 LAB — GLUCOSE, CAPILLARY
Glucose-Capillary: 222 mg/dL — ABNORMAL HIGH (ref 65–99)
Glucose-Capillary: 264 mg/dL — ABNORMAL HIGH (ref 65–99)
Glucose-Capillary: 243 mg/dL — ABNORMAL HIGH (ref 65–99)
Glucose-Capillary: 249 mg/dL — ABNORMAL HIGH (ref 65–99)

## 2015-05-06 NOTE — Progress Notes (Signed)
Physical Therapy Treatment Patient Details Name: Calvin Roth MRN: [id number] DOB: [birth year] Today's Date: 05/06/2015    History of Present Illness s/p L3-5 PLIF PMHx- DM    PT Comments    Patient is progressing well. Ambulated without RW and pt felt slightly unsteady (likely due to meds) and attempted to reach for rail. Educated to use RW rather than "furniture walk." Most difficult task remains getting into the bed.    Follow Up Recommendations  Home health PT; HHOT: Supervision - Intermittent     Equipment Recommendations  Rolling walker with 5" wheels    Recommendations for Other Services OT consult     Precautions / Restrictions Precautions Precautions: Back Precaution Comments: able to state 2/3 (not twisting); vc to adhere with bed mobility Required Braces or Orthoses: Spinal Brace Spinal Brace: Lumbar corset;Applied in sitting position Restrictions Weight Bearing Restrictions: No    Mobility  Bed Mobility Overal bed mobility: Needs Assistance Bed Mobility: Sit to Sidelying;Rolling Rolling: Supervision       Sit to sidelying: Min assist General bed mobility comments: pt with difficulty lowering from his elbow to his shoulder and maintaining side positioning as lowering to bed (starts to roll towards his back too soon); assist with legs  Transfers Overall transfer level: Needs assistance Equipment used: None Transfers: Sit to/from Stand Sit to Stand: Min guard            Ambulation/Gait Ambulation/Gait assistance: Min guard Ambulation Distance (Feet): 160 Feet Assistive device: None Gait Pattern/deviations: Step-through pattern;Decreased stride length   Gait velocity interpretation: Below normal speed for age/gender General Gait Details: more erect without RW, however stated he felt unsteady (not visibly)   Stairs Stairs: Yes Stairs assistance: Min guard Stair Management: One rail Left;Step to pattern;Sideways;Forwards Number of Stairs:  10 General stair comments: pt ascended facing forward; descended sideways with 2 hands on rail  Wheelchair Mobility    Modified Rankin (Stroke Patients Only)       Balance Overall balance assessment: Needs assistance Sitting-balance support: No upper extremity supported;Feet supported Sitting balance-Leahy Scale: Good     Standing balance support: No upper extremity supported Standing balance-Leahy Scale: Good Standing balance comment: stood x 3 minutes at sink brushing teeth; vc for no bending and educated on use of cup to spit into                    Cognition Arousal/Alertness: Awake/alert Behavior During Therapy: WFL for tasks assessed/performed Overall Cognitive Status: Within Functional Limits for tasks assessed                      Exercises      General Comments        Pertinent Vitals/Pain Pain Assessment: 0-10 Pain Score: 2  Pain Location: back Pain Descriptors / Indicators: Operative site guarding Pain Intervention(s): Limited activity within patient's tolerance;Monitored during session;Repositioned    Home Living                      Prior Function            PT Goals (current goals can now be found in the care plan section) Acute Rehab PT Goals Patient Stated Goal: be able to be home alone for several hours by Mon Time For Goal Achievement: 05/09/15 Progress towards PT goals: Progressing toward goals    Frequency  Min 5X/week    PT Plan Current plan remains appropriate    Co-evaluation  End of Session Equipment Utilized During Treatment: Gait belt;Back brace Activity Tolerance: Patient tolerated treatment well Patient left: in bed;with call bell/phone within reach     Time: [id number] PT Time Calculation (min) (ACUTE ONLY): 30 min  Charges:  $Gait Training: 8-22 mins $Therapeutic Activity: 8-22 mins                    G Codes:      Camari Wisham May 23, 2015, 9:44 AM Pager [phone]

## 2015-05-06 NOTE — Progress Notes (Signed)
Patient ID: Calvin Roth, male   DOB: 01/29/1946, 70 y.o.   MRN: [id number] Subjective:  The patient is alert and pleasant. He looks well. His back is appropriately sore. He wants to go home tomorrow.  Objective: Vital signs in last 24 hours: Temp:  [98.3 F (36.8 C)-99.7 F (37.6 C)] 99.6 F (37.6 C) (02/03 0451) Pulse Rate:  [92-102] 95 (02/03 0451) Resp:  [16-20] 16 (02/03 0451) BP: (97-134)/(50-69) 113/50 mmHg (02/03 0451) SpO2:  [95 %-98 %] 95 % (02/03 0451)  Intake/Output from previous day: 02/02 0701 - 02/03 0700 In: 55 [P.O.:360] Out: 890 [Urine:890] Intake/Output this shift: Total I/O In: -  Out: 200 [Urine:200]  Physical exam the patient is alert and pleasant. He is moving his lower extremity as well.  Lab Results:  Recent Labs  05/05/15 0406  WBC 9.3  HGB 13.1  HCT 39.8  PLT 156   BMET  Recent Labs  05/05/15 0406  NA 136  K 4.6  CL 101  CO2 23  GLUCOSE 173*  BUN 15  CREATININE 1.15  CALCIUM 8.3*    Studies/Results: Dg Lumbar Spine 2-3 Views  05/04/2015  CLINICAL DATA:  Status post lumbar fusion EXAM: DG C-ARM 61-120 MIN; LUMBAR SPINE - 2-3 VIEW COMPARISON:  03/21/2015 FINDINGS: Intraoperative radiographs show pedicle screws at L3 through L5. Interbody cage material is identified at L3-4 and L4-5. IMPRESSION: 1. Intraoperative radiographs demonstrate placement of pedicle screws and interbody spacer at L3 through L5. Electronically Signed   By: Kerby Moors M.D.   On: 05/04/2015 18:09   Dg Lumbar Spine 1 View  05/04/2015  CLINICAL DATA:  70 year old male undergoing L3-L5 posterior lumbar interbody fusion EXAM: LUMBAR SPINE - 1 VIEW COMPARISON:  None. FINDINGS: Single cross-table lateral radiograph demonstrates soft tissue spreaders in the tissues posteriorly along with a linear radiopacity consistent with a surgical sponge. A metallic probe is present at the inferior articulating process of L4. IMPRESSION: Intraoperative localization radiographs as  above. Electronically Signed   By: Jacqulynn Cadet M.D.   On: 05/04/2015 15:53   Dg C-arm 1-60 Min  05/04/2015  CLINICAL DATA:  Status post lumbar fusion EXAM: DG C-ARM 61-120 MIN; LUMBAR SPINE - 2-3 VIEW COMPARISON:  03/21/2015 FINDINGS: Intraoperative radiographs show pedicle screws at L3 through L5. Interbody cage material is identified at L3-4 and L4-5. IMPRESSION: 1. Intraoperative radiographs demonstrate placement of pedicle screws and interbody spacer at L3 through L5. Electronically Signed   By: Kerby Moors M.D.   On: 05/04/2015 18:09    Assessment/Plan: Postop day #2: The patient is doing well. He will likely go home tomorrow. I gave him his discharge instructions and answered all his questions.  LOS: 2 days     Adrielle Polakowski D 05/06/2015, 7:31 AM

## 2015-05-06 NOTE — Progress Notes (Signed)
From chair pt with brace on, ambulated with walker and stand by assist to bathroom. Pt then transferred to bed. Pt denies needs.

## 2015-05-06 NOTE — Care Management Important Message (Signed)
Important Message  Patient Details  Name: Calvin Roth MRN: [id number] Date of Birth: 10-Feb-1946   Medicare Important Message Given:  Yes    [name] P [place] 05/06/2015, 4:10 PM

## 2015-05-06 NOTE — Progress Notes (Signed)
Hourly rounding performed. Pt stand by assist to bathroom, with brace on and walker. Pt back to bed. Call light within reach. Pt in no acute distress. Denies needs.

## 2015-05-06 NOTE — Care Management Note (Signed)
Case Management Note  Patient Details  Name: ALEC JAROS MRN: [id number] Date of Birth: 1945-11-08  Subjective/Objective:                    Action/Plan: Patient ordered a walker for home. CM met with patient and he would like walker for home. Jermaine with [facility] notified and will deliver the equipment to the room. CM will continue to follow for discharge needs.   Expected Discharge Date:                  Expected Discharge Plan:     In-House Referral:     Discharge planning Services     Post Acute Care Choice:    Choice offered to:     DME Arranged:    DME Agency:     HH Arranged:    [place] Agency:     Status of Service:     Medicare Important Message Given:    Date Medicare IM Given:    Medicare IM give by:    Date Additional Medicare IM Given:    Additional Medicare Important Message give by:     If discussed at [place] of Stay Meetings, dates discussed:    Additional Comments:  Pollie Friar, RN 05/06/2015, 2:11 PM

## 2015-05-06 NOTE — Progress Notes (Signed)
Inpatient Diabetes Program Recommendations  AACE/ADA: New Consensus Statement on Inpatient Glycemic Control (2015)  Target Ranges:  Prepandial:   less than 140 mg/dL      Peak postprandial:   less than 180 mg/dL (1-2 hours)      Critically ill patients:  140 - 180 mg/dL   Review of Glycemic Control Inpatient Diabetes Program Recommendations:    Glucose remains in 200's range. Noted addition of home levemir insulin at 20 units bid added yesterday. Please also add moderate correction tidwc and HS scales.  Thank you Rosita Kea, RN, MSN, CDE  Diabetes Inpatient Program Office: [phone] Pager: [phone] 8:00 am to 5:00 pm

## 2015-05-07 LAB — GLUCOSE, CAPILLARY
Glucose-Capillary: 119 mg/dL — ABNORMAL HIGH (ref 65–99)
Glucose-Capillary: 191 mg/dL — ABNORMAL HIGH (ref 65–99)

## 2015-05-07 MED ORDER — OXYCODONE-ACETAMINOPHEN 5-325 MG PO TABS
1.0000 | ORAL_TABLET | ORAL | Status: DC | PRN
Start: 2015-05-07 — End: 2016-08-28

## 2015-05-07 MED ORDER — DIAZEPAM 5 MG PO TABS: 5.0000 mg | ORAL_TABLET | Freq: Four times a day (QID) | ORAL | Status: DC | PRN

## 2015-05-07 NOTE — Progress Notes (Signed)
Discharge instructions reviewed with patient/family. RXs given at this time. All belongings sent with patient. Transport home by family.  Ave Filter, RN

## 2015-05-07 NOTE — Progress Notes (Signed)
Patient with discharge order. He is waiting for his wife at this time, spouse will be here before around noon to transport pt home.   Ave Filter, RN

## 2015-05-07 NOTE — Discharge Summary (Signed)
Physician Discharge Summary  Patient ID: Calvin Roth MRN: [id number] DOB/AGE: Feb 08, 1946 70 y.o.  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 rate 18, height '5\' 11"'  (1.803 m), weight 127.4 kg (280 lb 13.9 oz), SpO2 94 %. Neurologic: Alert and oriented X 3, normal strength and tone. Normal symmetric reflexes. Normal coordination and gait Wound:CDI   Disposition: Home     Medication List    TAKE these medications        aspirin EC 81 MG tablet  Take by mouth.     atorvastatin 10 MG tablet  Commonly known as:  LIPITOR  Take by mouth.     diazepam 5 MG tablet  Commonly known as:  VALIUM  Take 1 tablet (5 mg total) by mouth every 6 (six) hours as needed for muscle spasms.     gabapentin 400 MG capsule  Commonly known as:  NEURONTIN  Take 1,200 mg by mouth at bedtime.     insulin lispro 100 UNIT/ML KiwkPen  Commonly known as:  HUMALOG  Inject 5 Units into the skin daily.     LEVEMIR FLEXTOUCH 100 UNIT/ML Pen  Generic drug:  Insulin Detemir  Inject 20 Units into the skin 2 (two) times daily.     losartan-hydrochlorothiazide 100-12.5 MG tablet  Commonly known as:  HYZAAR  Take by mouth.     oxyCODONE-acetaminophen 5-325 MG tablet  Commonly known as:  PERCOCET/ROXICET  Take 1-2 tablets by mouth every 4 (four) hours as needed for moderate pain.     pioglitazone-metformin 15-850 MG tablet  Commonly known as:  ACTOPLUS MET  Take 1 tablet by  mouth 2 (two) times daily with a meal.         Signed: Peggyann Shoals, MD 05/07/2015, 7:01 AM

## 2015-05-07 NOTE — Progress Notes (Signed)
Subjective: Patient reports doing well.  Objective: Vital signs in last 24 hours: Temp:  [97.5 F (36.4 C)-99.3 F (37.4 C)] 98.9 F (37.2 C) (02/04 0500) Pulse Rate:  [54-95] 85 (02/04 0500) Resp:  [18-20] 18 (02/04 0500) BP: (95-120)/(52-69) 95/60 mmHg (02/04 0500) SpO2:  [91 %-96 %] 94 % (02/04 0500)  Intake/Output from previous day: 02/03 0701 - 02/04 0700 In: 360 [P.O.:360] Out: 500 [Urine:500] Intake/Output this shift:    Physical Exam: Strength full.  Dressing CDI.  Lab Results:  Recent Labs  05/05/15 0406  WBC 9.3  HGB 13.1  HCT 39.8  PLT 156   BMET  Recent Labs  05/05/15 0406  NA 136  K 4.6  CL 101  CO2 23  GLUCOSE 173*  BUN 15  CREATININE 1.15  CALCIUM 8.3*    Studies/Results: No results found.  Assessment/Plan: Doing well.  Discharge home.    LOS: 3 days    Peggyann Shoals, MD 05/07/2015, 6:57 AM

## 2015-05-10 MED FILL — Sodium Chloride IV Soln 0.9%: INTRAVENOUS | Qty: 2000 | Status: AC

## 2015-05-10 MED FILL — Heparin Sodium (Porcine) Inj 1000 Unit/ML: INTRAMUSCULAR | Qty: 30 | Status: AC

## 2016-04-02 DIAGNOSIS — K635 Polyp of colon: Secondary | ICD-10-CM

## 2016-04-02 HISTORY — DX: Polyp of colon: K63.5

## 2016-07-16 ENCOUNTER — Encounter: Payer: Self-pay | Admitting: *Deleted

## 2016-07-17 ENCOUNTER — Ambulatory Visit: Payer: Medicare Other | Admitting: Anesthesiology

## 2016-07-17 ENCOUNTER — Ambulatory Visit
Admission: RE | Admit: 2016-07-17 | Discharge: 2016-07-17 | Disposition: A | Discharge status: Home / Self Care | Payer: Medicare Other | Source: Ambulatory Visit | Attending: Gastroenterology | Admitting: Gastroenterology

## 2016-07-17 ENCOUNTER — Encounter: Payer: Self-pay | Admitting: *Deleted

## 2016-07-17 ENCOUNTER — Encounter
Admission: RE | Disposition: A | Discharge status: Home / Self Care | Payer: Self-pay | Source: Ambulatory Visit | Attending: Gastroenterology

## 2016-07-17 DIAGNOSIS — K5730 Diverticulosis of large intestine without perforation or abscess without bleeding: Secondary | ICD-10-CM | POA: Insufficient documentation

## 2016-07-17 DIAGNOSIS — K621 Rectal polyp: Secondary | ICD-10-CM | POA: Diagnosis not present

## 2016-07-17 DIAGNOSIS — Z7982 Long term (current) use of aspirin: Secondary | ICD-10-CM | POA: Diagnosis not present

## 2016-07-17 DIAGNOSIS — D126 Benign neoplasm of colon, unspecified: Secondary | ICD-10-CM

## 2016-07-17 DIAGNOSIS — I10 Essential (primary) hypertension: Secondary | ICD-10-CM | POA: Insufficient documentation

## 2016-07-17 DIAGNOSIS — Z79899 Other long term (current) drug therapy: Secondary | ICD-10-CM | POA: Insufficient documentation

## 2016-07-17 DIAGNOSIS — E119 Type 2 diabetes mellitus without complications: Secondary | ICD-10-CM | POA: Insufficient documentation

## 2016-07-17 DIAGNOSIS — Z794 Long term (current) use of insulin: Secondary | ICD-10-CM | POA: Insufficient documentation

## 2016-07-17 DIAGNOSIS — Z86010 Personal history of colonic polyps: Secondary | ICD-10-CM | POA: Insufficient documentation

## 2016-07-17 HISTORY — PX: COLONOSCOPY WITH PROPOFOL: SHX5780

## 2016-07-17 HISTORY — DX: Benign neoplasm of colon, unspecified: D12.6

## 2016-07-17 LAB — GLUCOSE, CAPILLARY: Glucose-Capillary: 112 mg/dL — ABNORMAL HIGH (ref 65–99)

## 2016-07-17 SURGERY — COLONOSCOPY WITH PROPOFOL
Anesthesia: General

## 2016-07-17 MED ORDER — PROPOFOL 500 MG/50ML IV EMUL
INTRAVENOUS | Status: DC | PRN
  Administered 2016-07-17: 100 ug/kg/min via INTRAVENOUS

## 2016-07-17 MED ORDER — PROPOFOL 10 MG/ML IV BOLUS
INTRAVENOUS | Status: AC
  Filled 2016-07-17: qty 20

## 2016-07-17 MED ORDER — SODIUM CHLORIDE 0.9 % IV SOLN
INTRAVENOUS | Status: DC
  Administered 2016-07-17: 07:00:00 via INTRAVENOUS

## 2016-07-17 MED ORDER — PHENYLEPHRINE HCL 10 MG/ML IJ SOLN
INTRAMUSCULAR | Status: AC
  Filled 2016-07-17: qty 1

## 2016-07-17 MED ORDER — SODIUM CHLORIDE 0.9 % IV SOLN: INTRAVENOUS | Status: DC

## 2016-07-17 MED ORDER — PROPOFOL 10 MG/ML IV BOLUS
INTRAVENOUS | Status: DC | PRN
  Administered 2016-07-17: 100 mg via INTRAVENOUS

## 2016-07-17 MED ORDER — PROPOFOL 500 MG/50ML IV EMUL
INTRAVENOUS | Status: AC
  Filled 2016-07-17: qty 50

## 2016-07-17 NOTE — Anesthesia Preprocedure Evaluation (Signed)
Anesthesia Evaluation  Patient identified by MRN, date of birth, ID band Patient awake    Reviewed: Allergy & Precautions, H&P , NPO status , Patient's Chart, lab work & pertinent test results, reviewed documented beta blocker date and time   Airway Mallampati: II   Neck ROM: full    Dental  (+) Teeth Intact   Pulmonary neg pulmonary ROS,    Pulmonary exam normal        Cardiovascular hypertension, negative cardio ROS Normal cardiovascular exam Rhythm:regular Rate:Normal     Neuro/Psych  Neuromuscular disease negative neurological ROS  negative psych ROS   GI/Hepatic negative GI ROS, Neg liver ROS,   Endo/Other  negative endocrine ROSdiabetes, Well Controlled, Type 2, Oral Hypoglycemic Agents  Renal/GU negative Renal ROS  negative genitourinary   Musculoskeletal   Abdominal   Peds  Hematology negative hematology ROS (+)   Anesthesia Other Findings Past Medical History: No date: Diabetes mellitus without complication (HCC) No date: Hypertension Past Surgical History: No date: BACK SURGERY No date: CHOLECYSTECTOMY No date: stomach stapled     Comment: For weight loss BMI    Body Mass Index:  35.01 kg/m     Reproductive/Obstetrics negative OB ROS                             Anesthesia Physical Anesthesia Plan  ASA: III  Anesthesia Plan: General   Post-op Pain Management:    Induction:   Airway Management Planned:   Additional Equipment:   Intra-op Plan:   Post-operative Plan:   Informed Consent: I have reviewed the patients History and Physical, chart, labs and discussed the procedure including the risks, benefits and alternatives for the proposed anesthesia with the patient or authorized representative who has indicated his/her understanding and acceptance.   Dental Advisory Given  Plan Discussed with: CRNA  Anesthesia Plan Comments:         Anesthesia Quick  Evaluation

## 2016-07-17 NOTE — Op Note (Signed)
[facility] Gastroenterology Patient Name: Calvin Roth Procedure Date: 07/17/2016 7:25 AM MRN: [id number] Account #: [id number] Date of Birth: [birth year] Admit Type: Outpatient Age: 71 Room: [facility] ENDO ROOM 1 Gender: Male Note Status: Finalized Procedure:            Colonoscopy Indications:          Personal history of colonic polyps Providers:            Lollie Sails, MD Referring MD:         Ocie Cornfield. Ouida Sills MD, MD (Referring MD) Medicines:            Monitored Anesthesia Care Complications:        No immediate complications. Procedure:            Pre-Anesthesia Assessment:                       - ASA Grade Assessment: III - A patient with severe                        systemic disease.                       After obtaining informed consent, the colonoscope was                        passed under direct vision. Throughout the procedure,                        the patient's blood pressure, pulse, and oxygen                        saturations were monitored continuously. The                        Colonoscope was introduced through the anus and                        advanced to the the cecum, identified by appendiceal                        orifice and ileocecal valve. The colonoscopy was                        performed with moderate difficulty due to significant                        looping and a tortuous colon. Successful completion of                        the procedure was aided by changing the patient to a                        supine position, changing the patient to a prone                        position and using manual pressure. The patient                        tolerated the procedure well. The quality of the bowel  preparation was fair. Findings:      A few small-mouthed diverticula were found in the sigmoid colon and       descending colon.      A 15 mm polyp was found in the rectum. The polyp was sessile.  Biopsies       were taken with a cold forceps for histology with a cold snare. The       lesion is in the xtreme distal rectum posterior left/5:00 position. This       lesion is villous in appearance though possible squamous.      The exam was otherwise without abnormality.      No additional abnormalities were found on retroflexion. Impression:           - Preparation of the colon was fair.                       - Diverticulosis in the sigmoid colon and in the                        descending colon.                       - One 15 mm polyp in the rectum. Biopsied.                       - The examination was otherwise normal. Recommendation:       - Await pathology results. Depending on results, may                        need to have trans-anal excision of distal rectal                        lesion. Procedure Code(s):    --- Professional ---                       [phone], Colonoscopy, flexible; with biopsy, single or                        multiple Diagnosis Code(s):    --- Professional ---                       K62.1, Rectal polyp                       Z86.010, Personal history of colonic polyps                       K57.30, Diverticulosis of large intestine without                        perforation or abscess without bleeding CPT copyright 2016 American Medical Association. All rights reserved. The codes documented in this report are preliminary and upon coder review may  be revised to meet current compliance requirements. Lollie Sails, MD 07/17/2016 8:38:55 AM This report has been signed electronically. Number of Addenda: 0 Note Initiated On: 07/17/2016 7:25 AM Scope Withdrawal Time: 0 hours 17 minutes 44 seconds  Total Procedure Duration: 0 hours 40 minutes 22 seconds       [facility]

## 2016-07-17 NOTE — Transfer of Care (Signed)
Immediate Anesthesia Transfer of Care Note  Patient: Calvin Roth  Procedure(s) Performed: Procedure(s): COLONOSCOPY WITH PROPOFOL (N/A)  Patient Location: PACU and Endoscopy Unit  Anesthesia Type:General  Level of Consciousness: patient cooperative and lethargic  Airway & Oxygen Therapy: Patient Spontanous Breathing and Patient connected to nasal cannula oxygen  Post-op Assessment: Report given to RN and Post -op Vital signs reviewed and stable  Post vital signs: Reviewed and stable  Last Vitals:  Vitals:   07/17/16 0649 07/17/16 0839  BP: 125/80 112/75  Pulse: 92 64  Resp: 16   Temp: (!) 35.8 C 36.3 C    Last Pain:  Vitals:   07/17/16 0839  TempSrc: Tympanic         Complications: No apparent anesthesia complications

## 2016-07-17 NOTE — Anesthesia Postprocedure Evaluation (Signed)
Anesthesia Post Note  Patient: Calvin Roth  Procedure(s) Performed: Procedure(s) (LRB): COLONOSCOPY WITH PROPOFOL (N/A)  Patient location during evaluation: PACU Anesthesia Type: General Level of consciousness: awake and alert Pain management: pain level controlled Vital Signs Assessment: post-procedure vital signs reviewed and stable Respiratory status: spontaneous breathing, nonlabored ventilation, respiratory function stable and patient connected to nasal cannula oxygen Cardiovascular status: blood pressure returned to baseline and stable Postop Assessment: no signs of nausea or vomiting Anesthetic complications: no     Last Vitals:  Vitals:   07/17/16 0649 07/17/16 0839  BP: 125/80 112/75  Pulse: 92 64  Resp: 16   Temp: (!) 35.8 C 36.3 C    Last Pain:  Vitals:   07/17/16 0839  TempSrc: Tympanic                 Molli Barrows

## 2016-07-17 NOTE — H&P (Signed)
Outpatient short stay form Pre-procedure 07/17/2016 7:32 AM Lollie Sails MD  Primary Physician: Dr. Frazier Richards  Reason for visit:  Colonoscopy  History of present illness:  Patient is a 71 year old male presenting today as above. He has a personal history of adenomatous colon polyps with his last procedure being done in 2011. He tolerated his prep well. He takes no aspirin or blood thinning agents.    Current Facility-Administered Medications:  .  0.9 %  sodium chloride infusion, , Intravenous, Continuous, Lollie Sails, MD, Last Rate: 20 mL/hr at 07/17/16 0713 .  0.9 %  sodium chloride infusion, , Intravenous, Continuous, Lollie Sails, MD  Prescriptions Prior to Admission  Medication Sig Dispense Refill Last Dose  . aspirin EC 81 MG tablet Take by mouth.   Past Month at Unknown time  . azelastine (ASTELIN) 0.1 % nasal spray Use in each nostril as directed   07/16/2016 at Unknown time  . gabapentin (NEURONTIN) 400 MG capsule Take 1,200 mg by mouth at bedtime.    07/16/2016 at Unknown time  . LEVEMIR FLEXTOUCH 100 UNIT/ML Pen Inject 20 Units into the skin 2 (two) times daily.   11 07/17/2016 at Unknown time  . losartan-hydrochlorothiazide (HYZAAR) 100-12.5 MG tablet Take by mouth.   07/16/2016 at Unknown time  . metFORMIN (GLUCOPHAGE-XR) 500 MG 24 hr tablet 500 mg.   07/16/2016 at Unknown time  . atorvastatin (LIPITOR) 10 MG tablet Take by mouth.   05/03/2015 at Unknown time  . diazepam (VALIUM) 5 MG tablet Take 1 tablet (5 mg total) by mouth every 6 (six) hours as needed for muscle spasms. (Patient not taking: Reported on 07/17/2016) 60 tablet 0 Not Taking at Unknown time  . guaiFENesin-codeine 100-10 MG/5ML syrup Take by mouth.   Completed Course at Unknown time  . insulin lispro (HUMALOG) 100 UNIT/ML KiwkPen Inject 5 Units into the skin daily.    Not Taking at Unknown time  . oxyCODONE-acetaminophen (PERCOCET/ROXICET) 5-325 MG tablet Take 1-2 tablets by mouth every 4 (four)  hours as needed for moderate pain. (Patient not taking: Reported on 07/17/2016) 60 tablet 0 Not Taking at Unknown time  . pioglitazone-metformin (ACTOPLUS MET) 15-850 MG tablet Take 1 tablet by mouth 2 (two) times daily with a meal.    Not Taking at Unknown time     No Known Allergies   Past Medical History:  Diagnosis Date  . Diabetes mellitus without complication ([place])   . Hypertension     Review of systems:      Physical Exam    Heart and lungs: Regular rate and rhythm without rub or gallop, lungs are bilaterally clear.    HEENT: Normocephalic atraumatic eyes are anicteric    Other:     Pertinant exam for procedure: Soft nontender nondistended bowel sounds positive normoactive  Planned proceedures: Colonoscopy and indicated procedures. I have discussed the risks benefits and complications of procedures to include not limited to bleeding, infection, perforation and the risk of sedation and the patient wishes to proceed.    Lollie Sails, MD Gastroenterology 07/17/2016  7:32 AM

## 2016-07-17 NOTE — Anesthesia Post-op Follow-up Note (Cosign Needed)
Anesthesia QCDR form completed.        

## 2016-07-18 ENCOUNTER — Encounter: Payer: Self-pay | Admitting: Gastroenterology

## 2016-07-18 LAB — SURGICAL PATHOLOGY

## 2016-08-28 ENCOUNTER — Ambulatory Visit (INDEPENDENT_AMBULATORY_CARE_PROVIDER_SITE_OTHER): Payer: Medicare Other | Admitting: General Surgery

## 2016-08-28 ENCOUNTER — Encounter: Payer: Self-pay | Admitting: General Surgery

## 2016-08-28 VITALS — BP 128/82 | HR 80 | Resp 12 | Ht 71.0 in | Wt 252.0 lb

## 2016-08-28 DIAGNOSIS — K621 Rectal polyp: Secondary | ICD-10-CM

## 2016-08-28 DIAGNOSIS — K620 Anal polyp: Secondary | ICD-10-CM

## 2016-08-28 NOTE — Progress Notes (Signed)
Patient ID: Calvin Roth, male   DOB: 08-21-1945, 71 y.o.   MRN: [id number]  Chief Complaint  Patient presents with  . Other    HPI Calvin Roth is a 71 y.o. male.  Here today for evaluation of a rectal polyp referred by Dr Donnella Sham. No GI issues, bowels are regular , no bleeding.  HPI  Past Medical History:  Diagnosis Date  . Colon polyp 2018  . Diabetes mellitus without complication ([place])   . Hypertension     Past Surgical History:  Procedure Laterality Date  . BACK SURGERY  2017  . CHOLECYSTECTOMY    . COLONOSCOPY WITH PROPOFOL N/A 07/17/2016   Procedure: COLONOSCOPY WITH PROPOFOL;  Surgeon: Lollie Sails, MD;  Location: [facility] ENDOSCOPY;  Service: Endoscopy;  Laterality: N/A;  . stomach stapled  1982   For weight loss    Family History  Problem Relation Age of Onset  . Colon cancer Neg Hx   . Breast cancer Neg Hx     Social History Social History  Substance Use Topics  . Smoking status: Never Smoker  . Smokeless tobacco: Never Used  . Alcohol use Yes     Comment: 6 beersa wek    No Known Allergies  Current Outpatient Prescriptions  Medication Sig Dispense Refill  . atorvastatin (LIPITOR) 10 MG tablet Take by mouth.    Marland Kitchen GABAPENTIN PO Take 1,200 mg by mouth at bedtime.    Marland Kitchen LEVEMIR FLEXTOUCH 100 UNIT/ML Pen Inject 20 Units into the skin 2 (two) times daily.   11  . losartan-hydrochlorothiazide (HYZAAR) 100-12.5 MG tablet Take by mouth.    . metFORMIN (GLUCOPHAGE-XR) 500 MG 24 hr tablet 500 mg.     No current facility-administered medications for this visit.     Review of Systems Review of Systems  Respiratory: Negative.   Cardiovascular: Negative.   Gastrointestinal: Negative for anal bleeding, constipation and diarrhea.    Blood pressure 128/82, pulse 80, resp. rate 12, height 5\' 11"  (1.803 m), weight 252 lb (114.3 kg).  Physical Exam Physical Exam  Constitutional: He appears well-developed and well-nourished.  HENT:  Mouth/Throat:  Oropharynx is clear and moist.  Eyes: Conjunctivae are normal. No scleral icterus.  Neck: Neck supple.  Cardiovascular: Normal rate, regular rhythm and normal heart sounds.   Pulmonary/Chest: Effort normal and breath sounds normal.  Abdominal: Soft. Bowel sounds are normal. There is no tenderness.  Lymphadenopathy:    He has no cervical adenopathy.    Data Reviewed Progress notes Colonoscopy report Assessment    Anorectal polyp-approximately 2 cm flat polyp appears to be joining anal mucosa. Path showed adenoma with serrated and villous features.   This polyp is best excised via transanal approach  Plan    .  Discussed fully risk and benefits with patient and plan for removal of anorectal polyp via transanal approach    HPI, Physical Exam, Assessment and Plan have been scribed under the direction and in the presence of Mckinley Jewel, MD  Karie Fetch, RN  I have completed the exam and reviewed the above documentation for accuracy and completeness.  I agree with the above.  Haematologist has been used and any errors in dictation or transcription are unintentional.  Phinley Schall G. Jamal Collin, M.D., F.A.C.S.  The patient is scheduled for surgery at [facility] on 09/18/16. He will pre admit by phone. The patient will take Two Dulcolax tablets the evening prior to surgery. He will have a fleets enema one hour prior to  leaving for the hospital the morning of his surgery. The patient is aware of date and instructions.  Documented by Lesly Rubenstein LPN  [facility] G 08/28/2016, 3:53 PM

## 2016-08-28 NOTE — Patient Instructions (Addendum)
The patient is aware to call back for any questions or concerns.  The patient is scheduled for surgery at [facility] on 09/18/16. He will pre admit by phone. The patient will take Two Dulcolax tablets the evening prior to surgery. He will have a fleets enema one hour prior to leaving for the hospital the morning of his surgery. The patient is aware of date and instructions.

## 2016-09-11 ENCOUNTER — Encounter
Admission: RE | Admit: 2016-09-11 | Discharge: 2016-09-11 | Disposition: A | Discharge status: Home / Self Care | Payer: Medicare Other | Source: Ambulatory Visit | Attending: General Surgery | Admitting: General Surgery

## 2016-09-11 DIAGNOSIS — K620 Anal polyp: Secondary | ICD-10-CM

## 2016-09-11 DIAGNOSIS — Z01812 Encounter for preprocedural laboratory examination: Secondary | ICD-10-CM | POA: Diagnosis not present

## 2016-09-11 DIAGNOSIS — E119 Type 2 diabetes mellitus without complications: Secondary | ICD-10-CM | POA: Diagnosis not present

## 2016-09-11 DIAGNOSIS — Z01818 Encounter for other preprocedural examination: Secondary | ICD-10-CM | POA: Diagnosis not present

## 2016-09-11 DIAGNOSIS — I10 Essential (primary) hypertension: Secondary | ICD-10-CM | POA: Diagnosis not present

## 2016-09-11 DIAGNOSIS — K621 Rectal polyp: Secondary | ICD-10-CM

## 2016-09-11 MED ORDER — FLEET ENEMA 7-19 GM/118ML RE ENEM
1.0000 | ENEMA | Freq: Once | RECTAL | Status: DC
  Filled 2016-09-11: qty 1

## 2016-09-11 NOTE — Patient Instructions (Signed)
  Your procedure is scheduled on: 09/18/16 Report to Same Day Surgery 2nd floor medical mall [facility] Entrance-take elevator on left to 2nd floor.  Check in with surgery information desk.) To find out your arrival time please call [phone] between 1PM - 3PM on  09/17/16  Remember: Instructions that are not followed completely may result in serious medical risk, up to and including death, or upon the discretion of your surgeon and anesthesiologist your surgery may need to be rescheduled.    _x___ 1. Do not eat food or drink liquids after midnight. No gum chewing or                              hard candies.     __x__ 2. No Alcohol for 24 hours before or after surgery.   __x__3. No Smoking for 24 prior to surgery.   ____  4. Bring all medications with you on the day of surgery if instructed.    __x__ 5. Notify your doctor if there is any change in your medical condition     (cold, fever, infections).     Do not wear jewelry, make-up, hairpins, clips or nail polish.  Do not wear lotions, powders, or perfumes. You may wear deodorant.  Do not shave 48 hours prior to surgery. Men may shave face and neck.  Do not bring valuables to the hospital.    [facility] is not responsible for any belongings or valuables.               Contacts, dentures or bridgework may not be worn into surgery.  Leave your suitcase in the car. After surgery it may be brought to your room.  For patients admitted to the hospital, discharge time is determined by your                       treatment team.   Patients discharged the day of surgery will not be allowed to drive home.  You will need someone to drive you home and stay with you the night of your procedure.    Please read over the following fact sheets that you were given:   [facility] Preparing for Surgery  _x___ Take anti-hypertensive (unless it includes a diuretic), cardiac, seizure, asthma,     anti-reflux and psychiatric medicines. These  include:  1. atorvastatin  2.  3.  4.  5.  6.  __x__Fleets enema or Magnesium Citrate as directed. 1 hour prior to coming am of surgery ____ Use CHG Soap or sage wipes as directed on instruction sheet   ____ Use inhalers on the day of surgery and bring to hospital day of surgery  __x__ Stop Metformin and Janumet 2 days prior to surgery.    _x___ Take 1/2 of usual insulin dose the night before surgery and none on the morning     surgery.   ____ Follow recommendations from Cardiologist, Pulmonologist or PCP regarding  stopping Aspirin, Coumadin, Pllavix ,Eliquis, Effient, or Pradaxa, and Pletal.  X____Stop Anti-inflammatories such as Advil, Aleve, Ibuprofen, Motrin, Naproxen, Naprosyn, Goodies powders or aspirin products. OK to take Tylenol and  Celebrex.   ____ Stop supplements until after surgery.  But may continue Vitamin D, Vitamin B,   and multivitamin.   ____ Bring C-Pap to the hospital.

## 2016-09-18 ENCOUNTER — Ambulatory Visit
Admission: RE | Admit: 2016-09-18 | Discharge: 2016-09-18 | Disposition: A | Discharge status: Home / Self Care | Payer: Medicare Other | Source: Ambulatory Visit | Attending: General Surgery | Admitting: General Surgery

## 2016-09-18 ENCOUNTER — Encounter: Payer: Self-pay | Admitting: *Deleted

## 2016-09-18 ENCOUNTER — Ambulatory Visit: Payer: Medicare Other | Admitting: Anesthesiology

## 2016-09-18 ENCOUNTER — Encounter
Admission: RE | Disposition: A | Discharge status: Home / Self Care | Payer: Self-pay | Source: Ambulatory Visit | Attending: General Surgery

## 2016-09-18 DIAGNOSIS — D128 Benign neoplasm of rectum: Secondary | ICD-10-CM | POA: Diagnosis not present

## 2016-09-18 DIAGNOSIS — Z79899 Other long term (current) drug therapy: Secondary | ICD-10-CM | POA: Diagnosis not present

## 2016-09-18 DIAGNOSIS — K621 Rectal polyp: Secondary | ICD-10-CM | POA: Diagnosis present

## 2016-09-18 DIAGNOSIS — I10 Essential (primary) hypertension: Secondary | ICD-10-CM | POA: Insufficient documentation

## 2016-09-18 DIAGNOSIS — E119 Type 2 diabetes mellitus without complications: Secondary | ICD-10-CM | POA: Diagnosis not present

## 2016-09-18 DIAGNOSIS — Z7984 Long term (current) use of oral hypoglycemic drugs: Secondary | ICD-10-CM | POA: Diagnosis not present

## 2016-09-18 HISTORY — PX: TRANSANAL EXCISION OF RECTAL MASS: SHX6134

## 2016-09-18 LAB — GLUCOSE, CAPILLARY
Glucose-Capillary: 203 mg/dL — ABNORMAL HIGH (ref 65–99)
Glucose-Capillary: 175 mg/dL — ABNORMAL HIGH (ref 65–99)

## 2016-09-18 SURGERY — EXCISION, MASS, RECTUM, ANAL APPROACH
Anesthesia: General | Wound class: Clean Contaminated

## 2016-09-18 MED ORDER — SUCCINYLCHOLINE CHLORIDE 20 MG/ML IJ SOLN
INTRAMUSCULAR | Status: DC | PRN
  Administered 2016-09-18: 100 mg via INTRAVENOUS

## 2016-09-18 MED ORDER — SUGAMMADEX SODIUM 200 MG/2ML IV SOLN
INTRAVENOUS | Status: AC
  Filled 2016-09-18: qty 2

## 2016-09-18 MED ORDER — FENTANYL CITRATE (PF) 100 MCG/2ML IJ SOLN: 25.0000 ug | INTRAMUSCULAR | Status: DC | PRN

## 2016-09-18 MED ORDER — FENTANYL CITRATE (PF) 100 MCG/2ML IJ SOLN
INTRAMUSCULAR | Status: AC
  Filled 2016-09-18: qty 2

## 2016-09-18 MED ORDER — EPHEDRINE SULFATE 50 MG/ML IJ SOLN
INTRAMUSCULAR | Status: AC
  Filled 2016-09-18: qty 1

## 2016-09-18 MED ORDER — ROCURONIUM BROMIDE 100 MG/10ML IV SOLN
INTRAVENOUS | Status: DC | PRN
  Administered 2016-09-18: 5 mg via INTRAVENOUS

## 2016-09-18 MED ORDER — EPHEDRINE SULFATE 50 MG/ML IJ SOLN
INTRAMUSCULAR | Status: DC | PRN
  Administered 2016-09-18: 15 mg via INTRAVENOUS
  Administered 2016-09-18 (×2): 10 mg via INTRAVENOUS

## 2016-09-18 MED ORDER — FAMOTIDINE 20 MG PO TABS
ORAL_TABLET | ORAL | Status: AC
  Administered 2016-09-18: 20 mg via ORAL
  Filled 2016-09-18: qty 1

## 2016-09-18 MED ORDER — FENTANYL CITRATE (PF) 100 MCG/2ML IJ SOLN
INTRAMUSCULAR | Status: DC | PRN
  Administered 2016-09-18: 100 ug via INTRAVENOUS

## 2016-09-18 MED ORDER — LIDOCAINE HCL (PF) 2 % IJ SOLN
INTRAMUSCULAR | Status: AC
  Filled 2016-09-18: qty 2

## 2016-09-18 MED ORDER — ROCURONIUM BROMIDE 50 MG/5ML IV SOLN
INTRAVENOUS | Status: AC
  Filled 2016-09-18: qty 1

## 2016-09-18 MED ORDER — LIDOCAINE HCL (CARDIAC) 20 MG/ML IV SOLN
INTRAVENOUS | Status: DC | PRN
  Administered 2016-09-18: 100 mg via INTRAVENOUS

## 2016-09-18 MED ORDER — TRAMADOL HCL 50 MG PO TABS: 50.0000 mg | ORAL_TABLET | Freq: Four times a day (QID) | ORAL | 0 refills | Status: DC | PRN

## 2016-09-18 MED ORDER — DEXAMETHASONE SODIUM PHOSPHATE 10 MG/ML IJ SOLN
INTRAMUSCULAR | Status: DC | PRN
  Administered 2016-09-18: 5 mg via INTRAVENOUS

## 2016-09-18 MED ORDER — MIDAZOLAM HCL 2 MG/2ML IJ SOLN
INTRAMUSCULAR | Status: AC
  Filled 2016-09-18: qty 2

## 2016-09-18 MED ORDER — SODIUM CHLORIDE 0.9 % IV SOLN
INTRAVENOUS | Status: DC
  Administered 2016-09-18: 07:00:00 via INTRAVENOUS

## 2016-09-18 MED ORDER — BACITRACIN ZINC 500 UNIT/GM EX OINT
TOPICAL_OINTMENT | CUTANEOUS | Status: AC
  Filled 2016-09-18: qty 28.35

## 2016-09-18 MED ORDER — GLYCOPYRROLATE 0.2 MG/ML IJ SOLN
INTRAMUSCULAR | Status: AC
Start: 2016-09-18 — End: 2016-09-18
  Filled 2016-09-18: qty 1

## 2016-09-18 MED ORDER — ONDANSETRON HCL 4 MG/2ML IJ SOLN: 4.0000 mg | Freq: Once | INTRAMUSCULAR | Status: DC | PRN

## 2016-09-18 MED ORDER — DEXAMETHASONE SODIUM PHOSPHATE 10 MG/ML IJ SOLN
INTRAMUSCULAR | Status: AC
  Filled 2016-09-18: qty 1

## 2016-09-18 MED ORDER — MIDAZOLAM HCL 2 MG/2ML IJ SOLN
INTRAMUSCULAR | Status: DC | PRN
  Administered 2016-09-18: 2 mg via INTRAVENOUS

## 2016-09-18 MED ORDER — PROPOFOL 10 MG/ML IV BOLUS
INTRAVENOUS | Status: DC | PRN
  Administered 2016-09-18: 140 mg via INTRAVENOUS

## 2016-09-18 MED ORDER — BACITRACIN 500 UNIT/GM EX OINT
TOPICAL_OINTMENT | CUTANEOUS | Status: DC | PRN
  Administered 2016-09-18: 1 via TOPICAL

## 2016-09-18 MED ORDER — PHENYLEPHRINE HCL 10 MG/ML IJ SOLN
INTRAMUSCULAR | Status: AC
  Filled 2016-09-18: qty 1

## 2016-09-18 MED ORDER — FAMOTIDINE 20 MG PO TABS
20.0000 mg | ORAL_TABLET | Freq: Once | ORAL | Status: AC
  Administered 2016-09-18: 20 mg via ORAL

## 2016-09-18 MED ORDER — ACETAMINOPHEN 10 MG/ML IV SOLN
INTRAVENOUS | Status: AC
  Filled 2016-09-18: qty 100

## 2016-09-18 MED ORDER — PROPOFOL 10 MG/ML IV BOLUS
INTRAVENOUS | Status: AC
  Filled 2016-09-18: qty 20

## 2016-09-18 MED ORDER — ONDANSETRON HCL 4 MG/2ML IJ SOLN
INTRAMUSCULAR | Status: DC | PRN
Start: 2016-09-18 — End: 2016-09-18
  Administered 2016-09-18: 4 mg via INTRAVENOUS

## 2016-09-18 MED ORDER — BUPIVACAINE-EPINEPHRINE (PF) 0.5% -1:200000 IJ SOLN
INTRAMUSCULAR | Status: AC
  Filled 2016-09-18: qty 30

## 2016-09-18 MED ORDER — ONDANSETRON HCL 4 MG/2ML IJ SOLN
INTRAMUSCULAR | Status: AC
  Filled 2016-09-18: qty 2

## 2016-09-18 SURGICAL SUPPLY — 23 items
BRIEF STRETCH MATERNITY 2XLG (MISCELLANEOUS) ×2 IMPLANT
CANISTER SUCT 1200ML W/VALVE (MISCELLANEOUS) ×2 IMPLANT
DRAPE LAPAROTOMY 77X122 PED (DRAPES) ×2 IMPLANT
DRAPE LEGGINS SURG 28X43 STRL (DRAPES) ×2 IMPLANT
DRAPE UNDER BUTTOCK W/FLU (DRAPES) ×2 IMPLANT
ELECT REM PT RETURN 9FT ADLT (ELECTROSURGICAL) ×2
ELECTRODE REM PT RTRN 9FT ADLT (ELECTROSURGICAL) ×1 IMPLANT
GAUZE SPONGE 4X4 12PLY STRL (GAUZE/BANDAGES/DRESSINGS) IMPLANT
GLOVE BIO SURGEON STRL SZ7 (GLOVE) ×10 IMPLANT
GOWN STRL REUS W/ TWL LRG LVL3 (GOWN DISPOSABLE) ×2 IMPLANT
GOWN STRL REUS W/TWL LRG LVL3 (GOWN DISPOSABLE) ×2
KIT RM TURNOVER CYSTO AR (KITS) ×2 IMPLANT
LABEL OR SOLS (LABEL) ×2 IMPLANT
LIGASURE IMPACT 36 18CM CVD LR (INSTRUMENTS) ×2 IMPLANT
NEEDLE HYPO 25X1 1.5 SAFETY (NEEDLE) ×2 IMPLANT
NS IRRIG 500ML POUR BTL (IV SOLUTION) ×2 IMPLANT
PACK BASIN MINOR ARMC (MISCELLANEOUS) ×2 IMPLANT
PAD ABD DERMACEA PRESS 5X9 (GAUZE/BANDAGES/DRESSINGS) ×2 IMPLANT
PAD PREP 24X41 OB/GYN DISP (PERSONAL CARE ITEMS) ×2 IMPLANT
SOL PREP PVP 2OZ (MISCELLANEOUS) ×2
SOLUTION PREP PVP 2OZ (MISCELLANEOUS) ×1 IMPLANT
SURGILUBE 2OZ TUBE FLIPTOP (MISCELLANEOUS) ×2 IMPLANT
SYRINGE 10CC LL (SYRINGE) ×2 IMPLANT

## 2016-09-18 NOTE — H&P (View-Only) (Signed)
Patient ID: Calvin Roth, male   DOB: 1945/07/26, 71 y.o.   MRN: [id number]  Chief Complaint  Patient presents with  . Other    HPI Calvin Roth is a 71 y.o. male.  Here today for evaluation of a rectal polyp referred by Dr Calvin Roth. No GI issues, bowels are regular , no bleeding.  HPI  Past Medical History:  Diagnosis Date  . Colon polyp 2018  . Diabetes mellitus without complication ([place])   . Hypertension     Past Surgical History:  Procedure Laterality Date  . BACK SURGERY  2017  . CHOLECYSTECTOMY    . COLONOSCOPY WITH PROPOFOL N/A 07/17/2016   Procedure: COLONOSCOPY WITH PROPOFOL;  Surgeon: Lollie Sails, MD;  Location: [facility] ENDOSCOPY;  Service: Endoscopy;  Laterality: N/A;  . stomach stapled  1982   For weight loss    Family History  Problem Relation Age of Onset  . Colon cancer Neg Hx   . Breast cancer Neg Hx     Social History Social History  Substance Use Topics  . Smoking status: Never Smoker  . Smokeless tobacco: Never Used  . Alcohol use Yes     Comment: 6 beersa wek    No Known Allergies  Current Outpatient Prescriptions  Medication Sig Dispense Refill  . atorvastatin (LIPITOR) 10 MG tablet Take by mouth.    Marland Kitchen GABAPENTIN PO Take 1,200 mg by mouth at bedtime.    Marland Kitchen LEVEMIR FLEXTOUCH 100 UNIT/ML Pen Inject 20 Units into the skin 2 (two) times daily.   11  . losartan-hydrochlorothiazide (HYZAAR) 100-12.5 MG tablet Take by mouth.    . metFORMIN (GLUCOPHAGE-XR) 500 MG 24 hr tablet 500 mg.     No current facility-administered medications for this visit.     Review of Systems Review of Systems  Respiratory: Negative.   Cardiovascular: Negative.   Gastrointestinal: Negative for anal bleeding, constipation and diarrhea.    Blood pressure 128/82, pulse 80, resp. rate 12, height 5\' 11"  (1.803 m), weight 252 lb (114.3 kg).  Physical Exam Physical Exam  Constitutional: He appears well-developed and well-nourished.  HENT:  Mouth/Throat:  Oropharynx is clear and moist.  Eyes: Conjunctivae are normal. No scleral icterus.  Neck: Neck supple.  Cardiovascular: Normal rate, regular rhythm and normal heart sounds.   Pulmonary/Chest: Effort normal and breath sounds normal.  Abdominal: Soft. Bowel sounds are normal. There is no tenderness.  Lymphadenopathy:    He has no cervical adenopathy.    Data Reviewed Progress notes Colonoscopy report Assessment    Anorectal polyp-approximately 2 cm flat polyp appears to be joining anal mucosa. Path showed adenoma with serrated and villous features.   This polyp is best excised via transanal approach  Plan    .  Discussed fully risk and benefits with patient and plan for removal of anorectal polyp via transanal approach    HPI, Physical Exam, Assessment and Plan have been scribed under the direction and in the presence of Mckinley Jewel, MD  Calvin Fetch, RN  I have completed the exam and reviewed the above documentation for accuracy and completeness.  I agree with the above.  Haematologist has been used and any errors in dictation or transcription are unintentional.  Seeplaputhur G. Roth Collin, M.D., F.A.C.S.  The patient is scheduled for surgery at [facility] on 09/18/16. He will pre admit by phone. The patient will take Two Dulcolax tablets the evening prior to surgery. He will have a fleets enema one hour prior to  leaving for the hospital the morning of his surgery. The patient is aware of date and instructions.  Documented by Calvin Rubenstein LPN  [facility] G 08/28/2016, 3:53 PM

## 2016-09-18 NOTE — Anesthesia Post-op Follow-up Note (Cosign Needed)
Anesthesia QCDR form completed.        

## 2016-09-18 NOTE — Transfer of Care (Signed)
Immediate Anesthesia Transfer of Care Note  Patient: Calvin Roth  Procedure(s) Performed: Procedure(s): TRANSANAL EXCISION OF RECTAL TUMOR (N/A)  Patient Location: PACU  Anesthesia Type:General  Level of Consciousness: drowsy and patient cooperative  Airway & Oxygen Therapy: Patient Spontanous Breathing and Patient connected to face mask oxygen  Post-op Assessment: Report given to RN and Post -op Vital signs reviewed and stable  Post vital signs: Reviewed and stable  Last Vitals:  Vitals:   09/18/16 0614 09/18/16 0813  BP:  122/72  Pulse: 82 89  Resp: 16 14  Temp: 36.4 C 36.9 C    Last Pain:  Vitals:   09/18/16 0813  TempSrc: Temporal         Complications: No apparent anesthesia complications

## 2016-09-18 NOTE — Interval H&P Note (Signed)
History and Physical Interval Note:  09/18/2016 6:58 AM  Calvin Roth  has presented today for surgery, with the diagnosis of ANORECTAL ADENOMA  The various methods of treatment have been discussed with the patient and family. After consideration of risks, benefits and other options for treatment, the patient has consented to  Procedure(s): TRANSANAL EXCISION OF RECTAL TUMOR (N/A) as a surgical intervention .  The patient's history has been reviewed, patient examined, no change in status, stable for surgery.  I have reviewed the patient's chart and labs.  Questions were answered to the patient's satisfaction.     Jyrah Blye G

## 2016-09-18 NOTE — Anesthesia Preprocedure Evaluation (Signed)
Anesthesia Evaluation  Patient identified by MRN, date of birth, ID band Patient awake    Reviewed: Allergy & Precautions, H&P , NPO status , Patient's Chart, lab work & pertinent test results, reviewed documented beta blocker date and time   Airway Mallampati: II   Neck ROM: full    Dental  (+) Teeth Intact   Pulmonary neg pulmonary ROS,    Pulmonary exam normal        Cardiovascular hypertension, Pt. on medications negative cardio ROS Normal cardiovascular exam Rhythm:regular Rate:Normal     Neuro/Psych  Neuromuscular disease negative neurological ROS  negative psych ROS   GI/Hepatic negative GI ROS, Neg liver ROS,   Endo/Other  negative endocrine ROSdiabetes, Well Controlled, Type 2, Insulin Dependent  Renal/GU negative Renal ROS  negative genitourinary   Musculoskeletal  (+) Arthritis , Osteoarthritis,    Abdominal   Peds negative pediatric ROS (+)  Hematology negative hematology ROS (+)   Anesthesia Other Findings Past Medical History: No date: Diabetes mellitus without complication (HCC) No date: Hypertension Past Surgical History: No date: BACK SURGERY No date: CHOLECYSTECTOMY No date: stomach stapled     Comment: For weight loss BMI    Body Mass Index:  35.01 kg/m     Reproductive/Obstetrics negative OB ROS                             Anesthesia Physical  Anesthesia Plan  ASA: III  Anesthesia Plan: General   Post-op Pain Management:    Induction: Intravenous  PONV Risk Score and Plan: 3 and Ondansetron, Dexamethasone, Propofol and Midazolam  Airway Management Planned: Oral ETT  Additional Equipment:   Intra-op Plan:   Post-operative Plan: Extubation in OR  Informed Consent: I have reviewed the patients History and Physical, chart, labs and discussed the procedure including the risks, benefits and alternatives for the proposed anesthesia with the patient or  authorized representative who has indicated his/her understanding and acceptance.   Dental Advisory Given  Plan Discussed with: CRNA and Surgeon  Anesthesia Plan Comments:         Anesthesia Quick Evaluation

## 2016-09-18 NOTE — Anesthesia Procedure Notes (Signed)
Procedure Name: Intubation Date/Time: 09/18/2016 7:32 AM Performed by: Darlyne Russian Pre-anesthesia Checklist: Patient identified, Emergency Drugs available, Suction available, Patient being monitored and Timeout performed Patient Re-evaluated:Patient Re-evaluated prior to inductionOxygen Delivery Method: Circle system utilized Preoxygenation: Pre-oxygenation with 100% oxygen Intubation Type: IV induction Ventilation: Mask ventilation without difficulty Laryngoscope Size: Mac and 4 Grade View: Grade I Tube type: Oral Tube size: 7.5 mm Number of attempts: 1 Airway Equipment and Method: Stylet Placement Confirmation: ETT inserted through vocal cords under direct vision,  positive ETCO2 and breath sounds checked- equal and bilateral Secured at: 22.5 cm Tube secured with: Tape Dental Injury: Teeth and Oropharynx as per pre-operative assessment

## 2016-09-18 NOTE — Discharge Instructions (Signed)

## 2016-09-18 NOTE — Op Note (Signed)
Preop diagnosis: Rectal polyp adenomatous  Post op diagnosis: Same  Operation: Transanal excision rectal polyp  Surgeon: Mckinley Jewel  Assistant:     Anesthesia: Gen.    Complications: None  EBL: Minimal  Drains: None  Description: Patient was put to sleep with an endotracheal tube and then placed in lithotomy position. The anal area was prepped and draped as sterile field and timeout performed. The distal and speculum examination was then performed. Located around 5:00 location in the lowermost portion of the rectal mucosa was a 2 cm long by 1 cm flat polyp. With the speculum in place exposing this polyp the polyp was lifted up and the LigaSure device was used to excise this completely. Evaluation of the rest of the rectal mucosa was unremarkable. The excised polyp was sent in formalin for pathology. The rectal area was covered with the bacitracin ointment. Patient subsequently was extubated and moved to recovery room stable condition

## 2016-09-18 NOTE — Anesthesia Postprocedure Evaluation (Signed)
Anesthesia Post Note  Patient: Calvin Roth  Procedure(s) Performed: Procedure(s) (LRB): TRANSANAL EXCISION OF RECTAL TUMOR (N/A)  Patient location during evaluation: PACU Anesthesia Type: General Level of consciousness: awake and alert and oriented Pain management: pain level controlled Vital Signs Assessment: post-procedure vital signs reviewed and stable Respiratory status: spontaneous breathing Cardiovascular status: blood pressure returned to baseline Anesthetic complications: no     Last Vitals:  Vitals:   09/18/16 0857 09/18/16 0933  BP: (!) 143/73 135/79  Pulse: 82 83  Resp: 16 16  Temp: 36.1 C     Last Pain:  Vitals:   09/18/16 0933  TempSrc:   PainSc: 0-No pain                 Brianne Maina

## 2016-09-19 LAB — SURGICAL PATHOLOGY

## 2016-09-26 ENCOUNTER — Ambulatory Visit (INDEPENDENT_AMBULATORY_CARE_PROVIDER_SITE_OTHER): Payer: Medicare Other | Admitting: General Surgery

## 2016-09-26 ENCOUNTER — Encounter: Payer: Self-pay | Admitting: General Surgery

## 2016-09-26 ENCOUNTER — Ambulatory Visit: Payer: Medicare Other | Admitting: General Surgery

## 2016-09-26 VITALS — BP 124/72 | HR 68 | Resp 12 | Ht 71.0 in | Wt 259.0 lb

## 2016-09-26 DIAGNOSIS — D128 Benign neoplasm of rectum: Secondary | ICD-10-CM

## 2016-09-26 NOTE — Progress Notes (Signed)
Patient ID: Calvin Roth, male   DOB: 1945/07/14, 72 y.o.   MRN: [id number]  Chief Complaint  Patient presents with  . Routine Post Op    HPI RICHARDS PHERIGO is a 71 y.o. male here today for his post op rectal polyp removed on 6/04/10/2016. Patient states he is doing well, no bleeding, no change in bowel.   Marland KitchenHPI  Past Medical History:  Diagnosis Date  . Colon polyp 2018  . Diabetes mellitus without complication ([place])   . Hypertension     Past Surgical History:  Procedure Laterality Date  . BACK SURGERY  2017  . CHOLECYSTECTOMY    . COLONOSCOPY WITH PROPOFOL N/A 07/17/2016   Procedure: COLONOSCOPY WITH PROPOFOL;  Surgeon: Lollie Sails, MD;  Location: [facility] ENDOSCOPY;  Service: Endoscopy;  Laterality: N/A;  . stomach stapled  1982   For weight loss  . TRANSANAL EXCISION OF RECTAL MASS N/A 09/18/2016   Procedure: TRANSANAL EXCISION OF RECTAL TUMOR;  Surgeon: Christene Lye, MD;  Location: [facility] ORS;  Service: General;  Laterality: N/A;    Family History  Problem Relation Age of Onset  . Colon cancer Neg Hx   . Breast cancer Neg Hx     Social History Social History  Substance Use Topics  . Smoking status: Never Smoker  . Smokeless tobacco: Never Used  . Alcohol use Yes     Comment: 6 beersa wek    No Known Allergies  Current Outpatient Prescriptions  Medication Sig Dispense Refill  . atorvastatin (LIPITOR) 10 MG tablet Take 10 mg by mouth daily.     Marland Kitchen gabapentin (NEURONTIN) 400 MG capsule Take 1,200 mg by mouth at bedtime.    Marland Kitchen ibuprofen (ADVIL,MOTRIN) 200 MG tablet Take 600-800 mg by mouth every 8 (eight) hours as needed (for pain.).    Marland Kitchen LEVEMIR FLEXTOUCH 100 UNIT/ML Pen Inject 20 Units into the skin 2 (two) times daily.   11  . losartan-hydrochlorothiazide (HYZAAR) 100-12.5 MG tablet Take 1 tablet by mouth daily.     . metFORMIN (GLUCOPHAGE-XR) 500 MG 24 hr tablet Take 500 mg by mouth 2 (two) times daily.     . traMADol (ULTRAM) 50 MG tablet Take 1 tablet  (50 mg total) by mouth every 6 (six) hours as needed. 10 tablet 0   No current facility-administered medications for this visit.     Review of Systems Review of Systems  Constitutional: Negative.   Respiratory: Negative.   Cardiovascular: Negative.     Blood pressure 124/72, pulse 68, resp. rate 12, height 5\' 11"  (1.803 m), weight 259 lb (117.5 kg).  Physical Exam Physical Exam  Constitutional: He is oriented to person, place, and time. He appears well-developed and well-nourished.  Pulmonary/Chest: Effort normal.  Neurological: He is alert and oriented to person, place, and time.  Skin: Skin is warm and dry.  Psychiatric: He has a normal mood and affect. His behavior is normal.    Data Reviewed Prior notes and pathology results reviewed. Pathology classified polyp as serrated adenoma.  Assessment    Serrated adenoma- s/p polypectomy. Pt doing well, no complaints, bleeding, change in bowel habits, etc. Diverticulosis- pt is asymptomatic and stable. Advised to increase fiber and fluid intake.    Plan    Patient to return towards end of the year for anoscopy exam. Advised to call office with any questions or concerns.     HPI, Physical Exam, Assessment and Plan have been scribed under the direction and in the  presence of Mckinley Jewel, MD  Gaspar Cola, CMA  I have completed the exam and reviewed the above documentation for accuracy and completeness.  I agree with the above.  Haematologist has been used and any errors in dictation or transcription are unintentional.  Seeplaputhur G. Jamal Collin, M.D., F.A.C.S.  Junie Panning G 09/26/2016, 11:06 AM

## 2016-09-26 NOTE — Patient Instructions (Signed)
Patient to return towards end of the year for anoscopy exam. Advised to call office with any questions or concerns.

## 2016-12-08 IMAGING — CR DG LUMBAR SPINE 1V
1 series · 1 of 1 positions shown · non-contrast
Comparison: None.

CLINICAL DATA: 69-year-old male undergoing L3-L5 posterior lumbar
interbody fusion

EXAM:
LUMBAR SPINE - 1 VIEW

[lat]
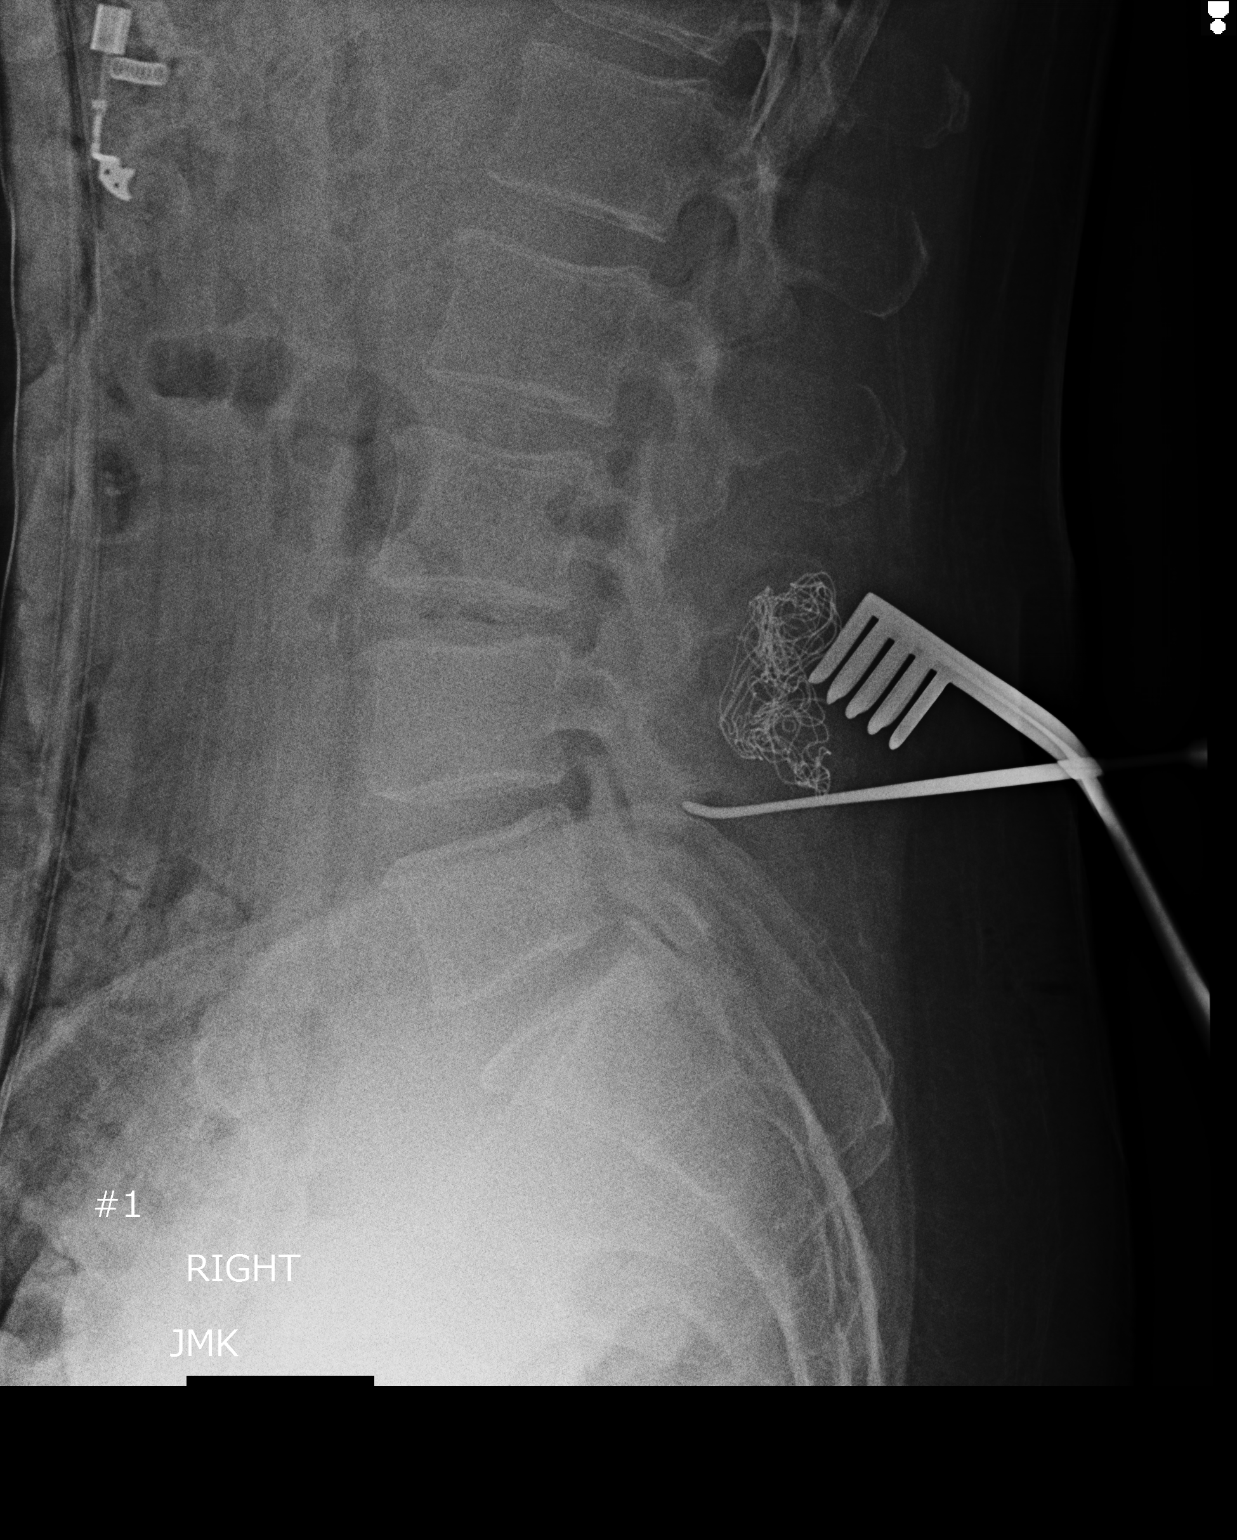

[1 of 1 positions shown; findings below may reference images not displayed]

FINDINGS: Single cross-table lateral radiograph demonstrates soft tissue
spreaders in the tissues posteriorly along with a linear radiopacity
consistent with a surgical sponge. A metallic probe is present at
the inferior articulating process of L4.
IMPRESSION: Intraoperative localization radiographs as above.

## 2017-03-14 ENCOUNTER — Encounter: Payer: Self-pay | Admitting: General Surgery

## 2017-03-14 ENCOUNTER — Ambulatory Visit (INDEPENDENT_AMBULATORY_CARE_PROVIDER_SITE_OTHER): Payer: Medicare Other | Admitting: General Surgery

## 2017-03-14 VITALS — BP 126/76 | HR 86 | Resp 12 | Ht 71.0 in | Wt 247.0 lb

## 2017-03-14 DIAGNOSIS — D128 Benign neoplasm of rectum: Secondary | ICD-10-CM | POA: Diagnosis not present

## 2017-03-14 NOTE — Patient Instructions (Signed)
Return as needed

## 2017-03-14 NOTE — Progress Notes (Signed)
Patient ID: Calvin Roth, male   DOB: Aug 27, 1945, 71 y.o.   MRN: [id number]  Chief Complaint  Patient presents with  . Follow-up    HPI Calvin Roth is a 71 y.o. male here today for a anoscopy . He had a rectum polyp excision 6 months ago- serrated adenoma, margins clear.Patient states he is doing well. No bowel changes and no blood per rectum                               .HPI  Past Medical History:  Diagnosis Date  . Colon polyp 2018  . Diabetes mellitus without complication ([place])   . Hypertension     Past Surgical History:  Procedure Laterality Date  . BACK SURGERY  2017  . CHOLECYSTECTOMY    . COLONOSCOPY WITH PROPOFOL N/A 07/17/2016   Procedure: COLONOSCOPY WITH PROPOFOL;  Surgeon: Lollie Sails, MD;  Location: [facility] ENDOSCOPY;  Service: Endoscopy;  Laterality: N/A;  . stomach stapled  1982   For weight loss  . TRANSANAL EXCISION OF RECTAL MASS N/A 09/18/2016   Procedure: TRANSANAL EXCISION OF RECTAL TUMOR;  Surgeon: Christene Lye, MD;  Location: [facility] ORS;  Service: General;  Laterality: N/A;    Family History  Problem Relation Age of Onset  . Colon cancer Neg Hx   . Breast cancer Neg Hx     Social History Social History   Tobacco Use  . Smoking status: Never Smoker  . Smokeless tobacco: Never Used  Substance Use Topics  . Alcohol use: Yes    Comment: 6 beersa wek  . Drug use: No    No Known Allergies  Current Outpatient Medications  Medication Sig Dispense Refill  . atorvastatin (LIPITOR) 10 MG tablet Take 10 mg by mouth daily.     Marland Kitchen gabapentin (NEURONTIN) 400 MG capsule Take 1,200 mg by mouth at bedtime.    Marland Kitchen ibuprofen (ADVIL,MOTRIN) 200 MG tablet Take 600-800 mg by mouth every 8 (eight) hours as needed (for pain.).    Marland Kitchen LEVEMIR FLEXTOUCH 100 UNIT/ML Pen Inject 20 Units into the skin 2 (two) times daily.   11  . losartan-hydrochlorothiazide (HYZAAR) 100-12.5 MG tablet Take 1 tablet by mouth daily.     . metFORMIN (GLUCOPHAGE-XR) 500 MG 24 hr  tablet Take 500 mg by mouth 2 (two) times daily.     . traMADol (ULTRAM) 50 MG tablet Take 1 tablet (50 mg total) by mouth every 6 (six) hours as needed. 10 tablet 0   No current facility-administered medications for this visit.     Review of Systems Review of Systems  Constitutional: Negative.   Respiratory: Negative.   Cardiovascular: Negative.     Blood pressure 126/76, pulse 86, resp. rate 12, height 5\' 11"  (1.803 m), weight 247 lb (112 kg).  Physical Exam Physical Exam  Constitutional: He is oriented to person, place, and time. He appears well-developed and well-nourished.  Cardiovascular: Normal rate and regular rhythm.  Genitourinary: Rectal exam shows no mass, no tenderness and anal tone normal.  Neurological: He is alert and oriented to person, place, and time.  Skin: Skin is warm and dry.   Anoscopy performed as planned. Anal and rectal mucosa appears normal  Data Reviewed Prior notes reviewed   Assessment    6 mo post transanal excision of a large serrated adenoma. No evidence of residual or recurrent polyp.    Plan    Return as  needed.The patient is aware to call back for any questions or concerns. Will need surveillance colonoscopy in 3-5 yrs.      HPI, Physical Exam, Assessment and Plan have been scribed under the direction and in the presence of Mckinley Jewel, MD  Gaspar Cola, CMA I have completed the exam and reviewed the above documentation for accuracy and completeness.  I agree with the above.  Haematologist has been used and any errors in dictation or transcription are unintentional.  Seeplaputhur G. Jamal Collin, M.D., F.A.C.S.   Junie Panning G 03/14/2017, 10:00 AM

## 2017-03-28 ENCOUNTER — Ambulatory Visit: Payer: Medicare Other | Admitting: General Surgery

## 2017-04-02 DIAGNOSIS — I719 Aortic aneurysm of unspecified site, without rupture: Secondary | ICD-10-CM

## 2017-04-02 HISTORY — DX: Aortic aneurysm of unspecified site, without rupture: I71.9

## 2017-10-31 ENCOUNTER — Emergency Department
Admission: EM | Admit: 2017-10-31 | Discharge: 2017-10-31 | Disposition: A | Discharge status: Home / Self Care | Payer: Medicare Other | Attending: Emergency Medicine | Admitting: Emergency Medicine

## 2017-10-31 ENCOUNTER — Other Ambulatory Visit: Payer: Self-pay

## 2017-10-31 ENCOUNTER — Emergency Department: Payer: Medicare Other

## 2017-10-31 ENCOUNTER — Encounter: Payer: Self-pay | Admitting: *Deleted

## 2017-10-31 DIAGNOSIS — E119 Type 2 diabetes mellitus without complications: Secondary | ICD-10-CM | POA: Diagnosis not present

## 2017-10-31 DIAGNOSIS — I10 Essential (primary) hypertension: Secondary | ICD-10-CM | POA: Insufficient documentation

## 2017-10-31 DIAGNOSIS — Y998 Other external cause status: Secondary | ICD-10-CM | POA: Insufficient documentation

## 2017-10-31 DIAGNOSIS — Y9389 Activity, other specified: Secondary | ICD-10-CM | POA: Insufficient documentation

## 2017-10-31 DIAGNOSIS — S21141A Puncture wound with foreign body of right front wall of thorax without penetration into thoracic cavity, initial encounter: Secondary | ICD-10-CM | POA: Diagnosis not present

## 2017-10-31 DIAGNOSIS — W228XXA Striking against or struck by other objects, initial encounter: Secondary | ICD-10-CM | POA: Diagnosis not present

## 2017-10-31 DIAGNOSIS — I712 Thoracic aortic aneurysm, without rupture, unspecified: Secondary | ICD-10-CM

## 2017-10-31 DIAGNOSIS — Z794 Long term (current) use of insulin: Secondary | ICD-10-CM | POA: Insufficient documentation

## 2017-10-31 DIAGNOSIS — Z23 Encounter for immunization: Secondary | ICD-10-CM | POA: Insufficient documentation

## 2017-10-31 DIAGNOSIS — I7121 Aneurysm of the ascending aorta, without rupture: Secondary | ICD-10-CM

## 2017-10-31 DIAGNOSIS — Y929 Unspecified place or not applicable: Secondary | ICD-10-CM | POA: Insufficient documentation

## 2017-10-31 DIAGNOSIS — Z79899 Other long term (current) drug therapy: Secondary | ICD-10-CM | POA: Insufficient documentation

## 2017-10-31 DIAGNOSIS — T148XXA Other injury of unspecified body region, initial encounter: Secondary | ICD-10-CM

## 2017-10-31 HISTORY — DX: Aneurysm of the ascending aorta, without rupture: I71.21

## 2017-10-31 HISTORY — DX: Thoracic aortic aneurysm, without rupture: I71.2

## 2017-10-31 LAB — CBC
HCT: 46.7 % (ref 40.0–52.0)
Hemoglobin: 15.8 g/dL (ref 13.0–18.0)
MCH: 31.6 pg (ref 26.0–34.0)
MCHC: 33.8 g/dL (ref 32.0–36.0)
MCV: 93.4 fL (ref 80.0–100.0)
Platelets: 205 10*3/uL (ref 150–440)
RBC: 4.99 MIL/uL (ref 4.40–5.90)
RDW: 13.4 % (ref 11.5–14.5)
WBC: 8.1 10*3/uL (ref 3.8–10.6)

## 2017-10-31 LAB — BASIC METABOLIC PANEL
Anion gap: 11 (ref 5–15)
BUN: 24 mg/dL — ABNORMAL HIGH (ref 8–23)
CO2: 26 mmol/L (ref 22–32)
Calcium: 9.2 mg/dL (ref 8.9–10.3)
Chloride: 102 mmol/L (ref 98–111)
Creatinine, Ser: 1.2 mg/dL (ref 0.61–1.24)
GFR calc Af Amer: >60 mL/min (ref >60)
GFR calc non Af Amer: 60 mL/min — ABNORMAL LOW (ref >60)
Glucose, Bld: 83 mg/dL (ref 70–99)
Potassium: 4 mmol/L (ref 3.5–5.1)
Sodium: 139 mmol/L (ref 135–145)

## 2017-10-31 LAB — PROTIME-INR
INR: 0.99
Prothrombin Time: 13 seconds (ref 11.4–15.2)

## 2017-10-31 MED ORDER — CEPHALEXIN 500 MG PO CAPS: 500.0000 mg | ORAL_CAPSULE | Freq: Four times a day (QID) | ORAL | 0 refills | Status: AC

## 2017-10-31 MED ORDER — TETANUS-DIPHTH-ACELL PERTUSSIS 5-2.5-18.5 LF-MCG/0.5 IM SUSP
0.5000 mL | Freq: Once | INTRAMUSCULAR | Status: AC
  Administered 2017-10-31: 0.5 mL via INTRAMUSCULAR
  Filled 2017-10-31: qty 0.5

## 2017-10-31 MED ORDER — IOPAMIDOL (ISOVUE-370) INJECTION 76%
75.0000 mL | Freq: Once | INTRAVENOUS | Status: AC | PRN
  Administered 2017-10-31: 75 mL via INTRAVENOUS
  Filled 2017-10-31: qty 75

## 2017-10-31 MED ORDER — PIPERACILLIN-TAZOBACTAM 3.375 G IVPB 30 MIN
3.3750 g | Freq: Once | INTRAVENOUS | Status: AC
  Administered 2017-10-31: 3.375 g via INTRAVENOUS
  Filled 2017-10-31: qty 50

## 2017-10-31 NOTE — ED Notes (Addendum)
See triage note  States he fell while carrying a metal sprinkler  States when he fell he put his arms out and a prong went thur his side   Puncture wound noted to right axilla and to back

## 2017-10-31 NOTE — ED Provider Notes (Signed)
[facility] Emergency Department Provider Note  ____________________________________________  Time seen: Approximately 12:22 PM  I have reviewed the triage vital signs and the nursing notes.   HISTORY  Chief Complaint Puncture Wound    HPI Calvin Roth is a 72 y.o. male that presents to the emergency department for evaluation of a through and through puncture injury to lateral chest wall.  Patient was carrying a sprinkler when he lost his footing.  The rod from the sprinkler went through the side of his chest.  He pulled the sprinkler out because he did not realize how deep it was.  He denies any trouble breathing.  He does not take any blood thinners.   Past Medical History:  Diagnosis Date  . Colon polyp 2018  . Diabetes mellitus without complication ([place])   . Hypertension     Patient Active Problem List   Diagnosis Date Noted  . Spondylolisthesis of lumbar region 05/04/2015  . Degeneration of intervertebral disc of lumbar region 10/19/2014  . Neuritis or radiculitis due to rupture of lumbar intervertebral disc 10/19/2014  . Lumbar canal stenosis 10/19/2014  . Morbid obesity ([place]) 01/23/2014  . Benign prostatic hyperplasia with urinary obstruction 10/01/2013  . Type 2 diabetes mellitus ([place]) 10/01/2013  . Benign hypertension 10/01/2013  . Spinal stenosis 10/01/2013    Past Surgical History:  Procedure Laterality Date  . BACK SURGERY  2017  . CHOLECYSTECTOMY    . COLONOSCOPY WITH PROPOFOL N/A 07/17/2016   Procedure: COLONOSCOPY WITH PROPOFOL;  Surgeon: Lollie Sails, MD;  Location: [facility] ENDOSCOPY;  Service: Endoscopy;  Laterality: N/A;  . stomach stapled  1982   For weight loss  . TRANSANAL EXCISION OF RECTAL MASS N/A 09/18/2016   Procedure: TRANSANAL EXCISION OF RECTAL TUMOR;  Surgeon: Christene Lye, MD;  Location: [facility] ORS;  Service: General;  Laterality: N/A;    Prior to Admission medications   Medication Sig Start Date End  Date Taking? Authorizing Provider  atorvastatin (LIPITOR) 10 MG tablet Take 10 mg by mouth daily.  10/27/14 09/06/17  [provider]  cephALEXin (KEFLEX) 500 MG capsule Take 1 capsule (500 mg total) by mouth 4 (four) times daily for 10 days. 10/31/17 11/10/17  Laban Emperor, PA-C  gabapentin (NEURONTIN) 400 MG capsule Take 1,200 mg by mouth at bedtime.    [provider]  ibuprofen (ADVIL,MOTRIN) 200 MG tablet Take 600-800 mg by mouth every 8 (eight) hours as needed (for pain.).    [provider]  LEVEMIR FLEXTOUCH 100 UNIT/ML Pen Inject 20 Units into the skin 2 (two) times daily.  03/19/15   [provider]  losartan-hydrochlorothiazide (HYZAAR) 100-12.5 MG tablet Take 1 tablet by mouth daily.  10/27/14   [provider]  metFORMIN (GLUCOPHAGE-XR) 500 MG 24 hr tablet Take 500 mg by mouth 2 (two) times daily.     [provider]  traMADol (ULTRAM) 50 MG tablet Take 1 tablet (50 mg total) by mouth every 6 (six) hours as needed. 09/18/16   Christene Lye, MD    Allergies Patient has no known allergies.  Family History  Problem Relation Age of Onset  . Colon cancer Neg Hx   . Breast cancer Neg Hx     Social History Social History   Tobacco Use  . Smoking status: Never Smoker  . Smokeless tobacco: Never Used  Substance Use Topics  . Alcohol use: Yes    Comment: 6 beersa wek  . Drug use: No  Review of Systems  Cardiovascular: No chest pain. Respiratory:  No SOB. Gastrointestinal: No abdominal pain.  No nausea, no vomiting.  Musculoskeletal: Negative for musculoskeletal pain. Skin: Negative for rash, abrasions, ecchymosis. Positive for puncture wound. Neurological: Negative for headaches   ____________________________________________   PHYSICAL EXAM:  VITAL SIGNS: ED Triage Vitals  Enc Vitals Group     BP 10/31/17 1149 (!) 135/94     Pulse Rate 10/31/17 1149 98     Resp 10/31/17 1149 16     Temp 10/31/17 1152  98.4 F (36.9 C)     Temp Source 10/31/17 1152 Oral     SpO2 10/31/17 1149 96 %     Weight 10/31/17 1150 252 lb (114.3 kg)     Height 10/31/17 1150 5\' 11"  (1.803 m)     Head Circumference --      Peak Flow --      Pain Score 10/31/17 1150 2     Pain Loc --      Pain Edu? --      Excl. in [place]? --      Constitutional: Alert and oriented. Well appearing and in no acute distress. Eyes: Conjunctivae are normal. PERRL. EOMI. Head: Atraumatic. ENT:      Ears:      Nose: No congestion/rhinnorhea.      Mouth/Throat: Mucous membranes are moist.  Neck: No stridor.   Cardiovascular: Normal rate, regular rhythm.  Good peripheral circulation. Respiratory: Normal respiratory effort without tachypnea or retractions. Lungs CTAB. Good air entry to the bases with no decreased or absent breath sounds. Musculoskeletal: Full range of motion to all extremities. No gross deformities appreciated. Neurologic:  Normal speech and language. No gross focal neurologic deficits are appreciated.  Skin:  Skin is warm, dry and intact. 1/2 cm through and through laceration running along right right ribcage from superior anterolateral chest inferior posterolateral chest.  Psychiatric: Mood and affect are normal. Speech and behavior are normal. Patient exhibits appropriate insight and judgement.   ____________________________________________   LABS (all labs ordered are listed, but only abnormal results are displayed)  Labs Reviewed  BASIC METABOLIC PANEL - Abnormal; Notable for the following components:      Result Value   BUN 24 (*)    GFR calc non Af Amer 60 (*)    All other components within normal limits  CBC  PROTIME-INR   ____________________________________________  EKG   ____________________________________________  RADIOLOGY Robinette Haines, personally viewed and evaluated these images (plain radiographs) as part of my medical decision making, as well as reviewing the written report by the  radiologist.   Dg Chest 2 View  Result Date: 10/31/2017 CLINICAL DATA:  Fall EXAM: CHEST - 2 VIEW COMPARISON:  None. FINDINGS: The heart size and mediastinal contours are within normal limits. Both lungs are clear. The visualized skeletal structures are unremarkable. IMPRESSION: No active cardiopulmonary disease. Electronically Signed   By: Franchot Gallo M.D.   On: 10/31/2017 13:12   Ct Chest W Contrast  Result Date: 10/31/2017 CLINICAL DATA:  Penetrating chest trauma. EXAM: CT CHEST WITH CONTRAST TECHNIQUE: Multidetector CT imaging of the chest was performed during intravenous contrast administration. CONTRAST:  66mL ISOVUE-370 IOPAMIDOL (ISOVUE-370) INJECTION 76% COMPARISON:  Radiograph of same day. FINDINGS: Cardiovascular: 4.4 cm ascending thoracic aortic aneurysm is noted without dissection. Great vessels are widely patent without significant stenosis. Coronary artery calcifications are noted. Normal cardiac size. No pericardial effusion. Mediastinum/Nodes: Small sliding-type hiatal hernia is noted. Thyroid gland is unremarkable. Calcified  subcarinal adenopathy is noted consistent with prior granulomatous disease. Lungs/Pleura: No pneumothorax or pleural effusion is noted. Calcified granuloma is noted in right lower lobe. No acute pulmonary disease is noted. Upper Abdomen: No acute abnormality. Musculoskeletal: Old right lower rib fracture is noted. No acute fracture or bony abnormality is noted. Small amount of subcutaneous gas is seen in subcutaneous tissues of right chest consistent with history of trauma. IMPRESSION: Small amount of gas is seen in subcutaneous tissues of right chest consistent with history of trauma. No other significant traumatic injury is noted. 4.4 cm ascending thoracic aortic aneurysm. Recommend annual imaging followup by CTA or MRA. This recommendation follows 2010 ACCF/AHA/AATS/ACR/ASA/SCA/SCAI/SIR/STS/SVM Guidelines for the Diagnosis and Management of Patients with Thoracic  Aortic Disease. Circulation. 2010; 121: B716-R678. Coronary artery calcifications are noted suggesting coronary artery disease. Small sliding-type hiatal hernia. Electronically Signed   By: Marijo Conception, M.D.   On: 10/31/2017 14:07    ____________________________________________    PROCEDURES  Procedure(s) performed:    Procedures    Medications  Tdap (BOOSTRIX) injection 0.5 mL (0.5 mLs Intramuscular Given 10/31/17 1306)  iopamidol (ISOVUE-370) 76 % injection 75 mL (75 mLs Intravenous Contrast Given 10/31/17 1344)  piperacillin-tazobactam (ZOSYN) IVPB 3.375 g (0 g Intravenous Stopped 10/31/17 1530)     ____________________________________________   INITIAL IMPRESSION / ASSESSMENT AND PLAN / ED COURSE  Pertinent labs & imaging results that were available during my care of the patient were reviewed by me and considered in my medical decision making (see chart for details).  Review of the [place] CSRS was performed in accordance of the [place] prior to dispensing any controlled drugs.     Patient presented to the emergency departmetn with through and through shallow puncture running alongside right rib cage.  Vital signs and exam are reassuring.  Chest x-ray is negative.  CT shows  small subcutaneous gas consistent with injury and no additional  traumatic injury.  All CT results were discussed with patient and he will follow-up with PCP regarding hernia and aneurysm.  Injury was cleaned with normal saline and syringe.  IV Zosyn was given to cover for infection.  Tetanus shot was updated.  Dr. Cherylann Banas was consulted and agrees with plan of care.  Patient will be discharged home with prescriptions for Keflex. Patient is to follow up with  PCP as directed. Patient is given ED precautions to return to the ED for any worsening or new symptoms.     ____________________________________________  FINAL CLINICAL IMPRESSION(S) / ED DIAGNOSES  Final diagnoses:  Puncture wound  Thoracic aortic  aneurysm without rupture ([place])      NEW MEDICATIONS STARTED DURING THIS VISIT:  ED Discharge Orders        Ordered    cephALEXin (KEFLEX) 500 MG capsule  4 times daily     10/31/17 1528          This chart was dictated using voice recognition software/Dragon. Despite best efforts to proofread, errors can occur which can change the meaning. Any change was purely unintentional.    Laban Emperor, PA-C 10/31/17 1602    Arta Silence, MD 11/01/17 [phone]

## 2017-10-31 NOTE — ED Triage Notes (Signed)
Pt to ED reporting a mechanical fall onto his sprinkler and was punctured by the metal rod. Metal was dirty and rusty per pt. Unknown last tetanus shot. No SOB abd no bleeding from puncture site. Puncture site is approx. 0.5 inch wide and located in upper right side under arm.

## 2017-10-31 DEATH — deceased

## 2018-02-04 ENCOUNTER — Ambulatory Visit: Payer: Medicare Other | Admitting: Anesthesiology

## 2018-02-04 ENCOUNTER — Ambulatory Visit
Admission: RE | Admit: 2018-02-04 | Discharge: 2018-02-04 | Disposition: A | Discharge status: Home / Self Care | Payer: Medicare Other | Source: Ambulatory Visit | Attending: Gastroenterology | Admitting: Gastroenterology

## 2018-02-04 ENCOUNTER — Encounter
Admission: RE | Disposition: A | Discharge status: Home / Self Care | Payer: Self-pay | Source: Ambulatory Visit | Attending: Gastroenterology

## 2018-02-04 ENCOUNTER — Encounter: Payer: Self-pay | Admitting: *Deleted

## 2018-02-04 DIAGNOSIS — Z794 Long term (current) use of insulin: Secondary | ICD-10-CM | POA: Diagnosis not present

## 2018-02-04 DIAGNOSIS — D129 Benign neoplasm of anus and anal canal: Secondary | ICD-10-CM | POA: Insufficient documentation

## 2018-02-04 DIAGNOSIS — Z86010 Personal history of colonic polyps: Secondary | ICD-10-CM | POA: Diagnosis present

## 2018-02-04 DIAGNOSIS — E119 Type 2 diabetes mellitus without complications: Secondary | ICD-10-CM | POA: Insufficient documentation

## 2018-02-04 DIAGNOSIS — Z79899 Other long term (current) drug therapy: Secondary | ICD-10-CM | POA: Diagnosis not present

## 2018-02-04 DIAGNOSIS — D122 Benign neoplasm of ascending colon: Secondary | ICD-10-CM | POA: Insufficient documentation

## 2018-02-04 DIAGNOSIS — Z09 Encounter for follow-up examination after completed treatment for conditions other than malignant neoplasm: Secondary | ICD-10-CM | POA: Insufficient documentation

## 2018-02-04 DIAGNOSIS — D123 Benign neoplasm of transverse colon: Secondary | ICD-10-CM | POA: Insufficient documentation

## 2018-02-04 DIAGNOSIS — I10 Essential (primary) hypertension: Secondary | ICD-10-CM | POA: Diagnosis not present

## 2018-02-04 DIAGNOSIS — K621 Rectal polyp: Secondary | ICD-10-CM | POA: Diagnosis not present

## 2018-02-04 DIAGNOSIS — D125 Benign neoplasm of sigmoid colon: Secondary | ICD-10-CM | POA: Insufficient documentation

## 2018-02-04 HISTORY — PX: COLONOSCOPY WITH PROPOFOL: SHX5780

## 2018-02-04 LAB — GLUCOSE, CAPILLARY
Glucose-Capillary: 71 mg/dL (ref 70–99)
Glucose-Capillary: 74 mg/dL (ref 70–99)

## 2018-02-04 SURGERY — COLONOSCOPY WITH PROPOFOL
Anesthesia: General

## 2018-02-04 MED ORDER — FENTANYL CITRATE (PF) 100 MCG/2ML IJ SOLN
INTRAMUSCULAR | Status: DC | PRN
  Administered 2018-02-04: 50 ug via INTRAVENOUS

## 2018-02-04 MED ORDER — SODIUM CHLORIDE 0.9 % IV SOLN
INTRAVENOUS | Status: DC
  Administered 2018-02-04 (×2): via INTRAVENOUS

## 2018-02-04 MED ORDER — PHENYLEPHRINE HCL 10 MG/ML IJ SOLN
INTRAMUSCULAR | Status: DC | PRN
  Administered 2018-02-04: 100 ug via INTRAVENOUS
  Administered 2018-02-04: 200 ug via INTRAVENOUS
  Administered 2018-02-04 (×2): 100 ug via INTRAVENOUS

## 2018-02-04 MED ORDER — LIDOCAINE HCL (CARDIAC) PF 100 MG/5ML IV SOSY
PREFILLED_SYRINGE | INTRAVENOUS | Status: DC | PRN
  Administered 2018-02-04: 100 mg via INTRAVENOUS

## 2018-02-04 MED ORDER — PROPOFOL 500 MG/50ML IV EMUL
INTRAVENOUS | Status: DC | PRN
  Administered 2018-02-04: 150 ug/kg/min via INTRAVENOUS

## 2018-02-04 MED ORDER — LIDOCAINE HCL (PF) 2 % IJ SOLN
INTRAMUSCULAR | Status: AC
  Filled 2018-02-04: qty 10

## 2018-02-04 MED ORDER — PROPOFOL 10 MG/ML IV BOLUS
INTRAVENOUS | Status: AC
  Filled 2018-02-04: qty 20

## 2018-02-04 MED ORDER — PROPOFOL 10 MG/ML IV BOLUS
INTRAVENOUS | Status: DC | PRN
  Administered 2018-02-04: 50 mg via INTRAVENOUS

## 2018-02-04 MED ORDER — PROPOFOL 500 MG/50ML IV EMUL
INTRAVENOUS | Status: AC
  Filled 2018-02-04: qty 50

## 2018-02-04 MED ORDER — EPHEDRINE SULFATE 50 MG/ML IJ SOLN
INTRAMUSCULAR | Status: AC
  Filled 2018-02-04: qty 1

## 2018-02-04 MED ORDER — FENTANYL CITRATE (PF) 100 MCG/2ML IJ SOLN
INTRAMUSCULAR | Status: AC
  Filled 2018-02-04: qty 2

## 2018-02-04 MED ORDER — EPHEDRINE SULFATE 50 MG/ML IJ SOLN
INTRAMUSCULAR | Status: DC | PRN
  Administered 2018-02-04 (×2): 10 mg via INTRAVENOUS

## 2018-02-04 NOTE — Anesthesia Post-op Follow-up Note (Addendum)
Anesthesia QCDR form completed.        

## 2018-02-04 NOTE — Op Note (Signed)
[facility] Gastroenterology Patient Name: Calvin Roth Procedure Date: 02/04/2018 7:20 AM MRN: [id number] Account #: [id number] Date of Birth: 1946-01-21 Admit Type: Outpatient Age: 72 Room: [facility] ENDO ROOM 3 Gender: Male Note Status: Finalized Procedure:            Colonoscopy Indications:          Personal history of colonic polyps Providers:            Lollie Sails, MD Referring MD:         Ocie Cornfield. Ouida Sills MD, MD (Referring MD) Medicines:            Monitored Anesthesia Care Complications:        No immediate complications. Procedure:            Pre-Anesthesia Assessment:                       - ASA Grade Assessment: II - A patient with mild                        systemic disease.                       After obtaining informed consent, the colonoscope was                        passed under direct vision. Throughout the procedure,                        the patient's blood pressure, pulse, and oxygen                        saturations were monitored continuously. The                        Colonoscope was introduced through the anus and                        advanced to the the cecum, identified by appendiceal                        orifice and ileocecal valve. The colonoscopy was                        performed without difficulty. The patient tolerated the                        procedure well. The patient tolerated the procedure                        well. The quality of the bowel preparation was good. Findings:      A 10 mm polyp was found in the distal transverse colon. The polyp was       sessile. The polyp was removed with a cold snare. Resection and       retrieval were complete.      A 7 mm polyp was found in the proximal ascending colon. The polyp was       sessile. The polyp was removed with a cold snare. Resection and       retrieval were complete.      A 3 mm polyp was found in the  sigmoid colon. The polyp was sessile. The   polyp was removed with a cold biopsy forceps. Resection and retrieval       were complete.      A 1 mm polyp was found in the rectum. The polyp was sessile. The polyp       was removed with a cold biopsy forceps. Resection and retrieval were       complete.      A 2 mm polypoid lesion was found at the anus. The lesion was       semi-pedunculated. No bleeding was present. The polyp was removed with a       cold biopsy forceps. Resection and retrieval were complete.      The digital rectal exam was normal.      The retroflexed view of the distal rectum and anal verge was normal and       showed no anal or rectal abnormalities. Impression:           - One 10 mm polyp in the distal transverse colon,                        removed with a cold snare. Resected and retrieved.                       - One 7 mm polyp in the proximal ascending colon,                        removed with a cold snare. Resected and retrieved.                       - One 3 mm polyp in the sigmoid colon, removed with a                        cold biopsy forceps. Resected and retrieved.                       - One 1 mm polyp in the rectum, removed with a cold                        biopsy forceps. Resected and retrieved.                       - Benign polypoid lesion at the anus. Complete removal                        was accomplished.                       - The distal rectum and anal verge are normal on                        retroflexion view. Recommendation:       - Discharge patient to home.                       - Soft diet today, then advance as tolerated to advance                        diet as tolerated.                       -  Await pathology results.                       - Telephone GI clinic for pathology results in 1 week. Procedure Code(s):    --- Professional ---                       [phone], Colonoscopy, flexible; with removal of tumor(s),                        polyp(s), or other lesion(s) by snare  technique                       45380, 35, Colonoscopy, flexible; with biopsy, single                        or multiple Diagnosis Code(s):    --- Professional ---                       D12.3, Benign neoplasm of transverse colon (hepatic                        flexure or splenic flexure)                       D12.2, Benign neoplasm of ascending colon                       D12.5, Benign neoplasm of sigmoid colon                       K62.1, Rectal polyp                       D12.9, Benign neoplasm of anus and anal canal                       Z86.010, Personal history of colonic polyps CPT copyright 2018 [employer]. All rights reserved. The codes documented in this report are preliminary and upon coder review may  be revised to meet current compliance requirements. Lollie Sails, MD 02/04/2018 8:14:35 AM This report has been signed electronically. Number of Addenda: 0 Note Initiated On: 02/04/2018 7:20 AM Scope Withdrawal Time: 0 hours 10 minutes 33 seconds  Total Procedure Duration: 0 hours 28 minutes 58 seconds       [facility]

## 2018-02-04 NOTE — Anesthesia Postprocedure Evaluation (Signed)
Anesthesia Post Note  Patient: Calvin Roth  Procedure(s) Performed: COLONOSCOPY WITH PROPOFOL (N/A )  Patient location during evaluation: PACU Anesthesia Type: General Level of consciousness: awake and alert Pain management: pain level controlled Vital Signs Assessment: post-procedure vital signs reviewed and stable Respiratory status: spontaneous breathing, nonlabored ventilation and respiratory function stable Cardiovascular status: blood pressure returned to baseline and stable Postop Assessment: no apparent nausea or vomiting Anesthetic complications: no     Last Vitals:  Vitals:   02/04/18 0845 02/04/18 0855  BP: 122/76 116/74  Pulse: 79 77  Resp: 13 14  Temp:    SpO2: 95% 95%    Last Pain:  Vitals:   02/04/18 0845  TempSrc:   PainSc: 0-No pain                 Durenda Hurt

## 2018-02-04 NOTE — Transfer of Care (Addendum)
Immediate Anesthesia Transfer of Care Note  Patient: Calvin Roth  Procedure(s) Performed: COLONOSCOPY WITH PROPOFOL (N/A )  Patient Location: PACU  Anesthesia Type:General  Level of Consciousness: awake and alert   Airway & Oxygen Therapy: Patient Spontanous Breathing, Patient connected to nasal cannula oxygen and Patient connected to face mask oxygen  Post-op Assessment: Report given to RN and Post -op Vital signs reviewed and stable  Post vital signs: Reviewed and stable  Last Vitals:  Vitals Value Taken Time  BP    Temp    Pulse    Resp    SpO2      Last Pain:  Vitals:   02/04/18 0655  TempSrc: Tympanic         Complications: No apparent anesthesia complications

## 2018-02-04 NOTE — H&P (Signed)
Outpatient short stay form Pre-procedure 02/04/2018 7:28 AM Calvin Sails MD  Primary Physician: Frazier Richards  Reason for visit: Colonoscopy  History of present illness: Patient is a 72 year old male presenting today as above.  His personal history of adenomatous colon polyps including one that had to be excised transanally in 2018.  Presenting today for recheck.  Is doing well.  He has no rectal bleeding or abdominal pain.  There is no diarrhea.    Current Facility-Administered Medications:  .  0.9 %  sodium chloride infusion, , Intravenous, Continuous, Calvin Sails, MD, Last Rate: 20 mL/hr at 02/04/18 6256  Medications Prior to Admission  Medication Sig Dispense Refill Last Dose  . atorvastatin (LIPITOR) 10 MG tablet Take 10 mg by mouth daily.    02/03/2018 at Unknown time  . gabapentin (NEURONTIN) 400 MG capsule Take 1,200 mg by mouth at bedtime.   02/03/2018 at Unknown time  . ibuprofen (ADVIL,MOTRIN) 200 MG tablet Take 600-800 mg by mouth every 8 (eight) hours as needed (for pain.).   Past Month at Unknown time  . LEVEMIR FLEXTOUCH 100 UNIT/ML Pen Inject 20 Units into the skin 2 (two) times daily.   11 02/04/2018 at Unknown time  . losartan-hydrochlorothiazide (HYZAAR) 100-12.5 MG tablet Take 1 tablet by mouth daily.    02/04/2018 at Unknown time  . metFORMIN (GLUCOPHAGE-XR) 500 MG 24 hr tablet Take 500 mg by mouth 2 (two) times daily.    02/03/2018 at Unknown time  . traMADol (ULTRAM) 50 MG tablet Take 1 tablet (50 mg total) by mouth every 6 (six) hours as needed. (Patient not taking: Reported on 02/04/2018) 10 tablet 0 Not Taking at Unknown time     No Known Allergies   Past Medical History:  Diagnosis Date  . Colon polyp 2018  . Diabetes mellitus without complication ([place])   . Hypertension     Review of systems:      Physical Exam    Heart and lungs: Rhythm without rub or gallop, lungs are bilaterally clear.    HEENT: Normocephalic atraumatic eyes are  anicteric    Other:    Pertinant exam for procedure: Soft nontender nondistended bowel sounds positive normoactive.    Planned proceedures: Colonoscopy and indicated procedures. I have discussed the risks benefits and complications of procedures to include not limited to bleeding, infection, perforation and the risk of sedation and the patient wishes to proceed.    Calvin Sails, MD Gastroenterology 02/04/2018  7:28 AM

## 2018-02-04 NOTE — Anesthesia Preprocedure Evaluation (Addendum)
Anesthesia Evaluation  Patient identified by MRN, date of birth, ID band Patient awake    Reviewed: Allergy & Precautions, H&P , NPO status , Patient's Chart, lab work & pertinent test results  Airway Mallampati: III  TM Distance: >3 FB     Dental  (+) Teeth Intact   Pulmonary neg pulmonary ROS,    breath sounds clear to auscultation       Cardiovascular hypertension,  Rhythm:regular Rate:Normal     Neuro/Psych negative neurological ROS  negative psych ROS   GI/Hepatic negative GI ROS, Neg liver ROS,   Endo/Other  diabetes  Renal/GU negative Renal ROS  negative genitourinary   Musculoskeletal  (+) Arthritis ,   Abdominal   Peds  Hematology negative hematology ROS (+)   Anesthesia Other Findings Past Medical History: 2018: Colon polyp No date: Diabetes mellitus without complication ([place]) No date: Hypertension  Past Surgical History: 2017: BACK SURGERY No date: CHOLECYSTECTOMY 07/17/2016: COLONOSCOPY WITH PROPOFOL; N/A     Comment:  Procedure: COLONOSCOPY WITH PROPOFOL;  Surgeon: Lollie Sails, MD;  Location: [facility] ENDOSCOPY;  Service:               Endoscopy;  Laterality: N/A; 1982: stomach stapled     Comment:  For weight loss 09/18/2016: TRANSANAL EXCISION OF RECTAL MASS; N/A     Comment:  Procedure: TRANSANAL EXCISION OF RECTAL TUMOR;  Surgeon:              Christene Lye, MD;  Location: [facility] ORS;                Service: General;  Laterality: N/A;  BMI    Body Mass Index:  35.01 kg/m      Reproductive/Obstetrics negative OB ROS                            Anesthesia Physical Anesthesia Plan  ASA: II  Anesthesia Plan: General   Post-op Pain Management:    Induction:   PONV Risk Score and Plan: Propofol infusion and TIVA  Airway Management Planned: Natural Airway and Nasal Cannula  Additional Equipment:   Intra-op Plan:   Post-operative  Plan:   Informed Consent: I have reviewed the patients History and Physical, chart, labs and discussed the procedure including the risks, benefits and alternatives for the proposed anesthesia with the patient or authorized representative who has indicated his/her understanding and acceptance.   Dental Advisory Given  Plan Discussed with: Anesthesiologist, CRNA and Surgeon  Anesthesia Plan Comments:        Anesthesia Quick Evaluation

## 2018-02-04 NOTE — Anesthesia Procedure Notes (Signed)
Date/Time: 02/04/2018 7:35 AM Performed by: Allean Found, CRNA Pre-anesthesia Checklist: Patient identified, Emergency Drugs available, Suction available, Patient being monitored and Timeout performed Patient Re-evaluated:Patient Re-evaluated prior to induction Oxygen Delivery Method: Nasal cannula Placement Confirmation: CO2 detector

## 2018-02-05 LAB — SURGICAL PATHOLOGY

## 2018-02-06 ENCOUNTER — Encounter: Payer: Self-pay | Admitting: Gastroenterology

## 2018-04-03 HISTORY — PX: CATARACT EXTRACTION: SUR2

## 2018-05-15 HISTORY — PX: CATARACT EXTRACTION: SUR2

## 2019-02-18 ENCOUNTER — Other Ambulatory Visit: Payer: Self-pay | Admitting: Internal Medicine

## 2019-02-18 DIAGNOSIS — I712 Thoracic aortic aneurysm, without rupture, unspecified: Secondary | ICD-10-CM

## 2019-02-18 DIAGNOSIS — Z794 Long term (current) use of insulin: Secondary | ICD-10-CM

## 2019-02-18 DIAGNOSIS — E119 Type 2 diabetes mellitus without complications: Secondary | ICD-10-CM

## 2019-03-12 ENCOUNTER — Other Ambulatory Visit: Payer: Self-pay

## 2019-03-12 ENCOUNTER — Ambulatory Visit
Admission: RE | Admit: 2019-03-12 | Discharge: 2019-03-12 | Disposition: A | Discharge status: Home / Self Care | Payer: Medicare Other | Source: Ambulatory Visit | Attending: Internal Medicine | Admitting: Internal Medicine

## 2019-03-12 DIAGNOSIS — E119 Type 2 diabetes mellitus without complications: Secondary | ICD-10-CM | POA: Diagnosis present

## 2019-03-12 DIAGNOSIS — Z794 Long term (current) use of insulin: Secondary | ICD-10-CM | POA: Diagnosis present

## 2019-03-12 DIAGNOSIS — I77810 Thoracic aortic ectasia: Secondary | ICD-10-CM

## 2019-03-12 DIAGNOSIS — I712 Thoracic aortic aneurysm, without rupture, unspecified: Secondary | ICD-10-CM

## 2019-03-12 HISTORY — DX: Thoracic aortic ectasia: I77.810

## 2019-03-12 LAB — POCT I-STAT CREATININE: Creatinine, Ser: 1.3 mg/dL — ABNORMAL HIGH (ref 0.61–1.24)

## 2019-03-12 MED ORDER — IOHEXOL 350 MG/ML SOLN
75.0000 mL | Freq: Once | INTRAVENOUS | Status: AC | PRN
  Administered 2019-03-12: 75 mL via INTRAVENOUS

## 2019-03-13 ENCOUNTER — Other Ambulatory Visit: Payer: Self-pay | Admitting: Internal Medicine

## 2019-03-13 DIAGNOSIS — I712 Thoracic aortic aneurysm, without rupture, unspecified: Secondary | ICD-10-CM

## 2019-05-11 ENCOUNTER — Ambulatory Visit: Payer: Medicare Other | Attending: Internal Medicine

## 2019-05-11 DIAGNOSIS — Z23 Encounter for immunization: Secondary | ICD-10-CM

## 2019-05-11 NOTE — Progress Notes (Signed)
   Covid-19 Vaccination Clinic  Name:  Calvin Roth    MRN: [id number] DOB: 06-06-1945  05/11/2019  [name] was observed post Covid-19 immunization for 15 minutes without incidence. He was provided with Vaccine Information Sheet and instruction to access the V-Safe system.   [name] was instructed to call 911 with any severe reactions post vaccine: Marland Kitchen Difficulty breathing  . Swelling of your face and throat  . A fast heartbeat  . A bad rash all over your body  . Dizziness and weakness    Immunizations Administered    Name Date Dose VIS Date Route   Pfizer COVID-19 Vaccine 05/11/2019 11:28 AM 0.3 mL 03/13/2019 Intramuscular   Manufacturer: [place]   Lot: [id number]   [place]: [id number]

## 2019-06-05 ENCOUNTER — Ambulatory Visit: Payer: Medicare Other | Attending: Internal Medicine

## 2019-06-05 DIAGNOSIS — Z23 Encounter for immunization: Secondary | ICD-10-CM | POA: Insufficient documentation

## 2019-06-05 NOTE — Progress Notes (Signed)
   Covid-19 Vaccination Clinic  Name:  Calvin Roth    MRN: [id number] DOB: April 04, 1945  06/05/2019  [name] was observed post Covid-19 immunization for 15 minutes without incident. He was provided with Vaccine Information Sheet and instruction to access the V-Safe system.   [name] was instructed to call 911 with any severe reactions post vaccine: Marland Kitchen Difficulty breathing  . Swelling of face and throat  . A fast heartbeat  . A bad rash all over body  . Dizziness and weakness   Immunizations Administered    Name Date Dose VIS Date Route   Pfizer COVID-19 Vaccine 06/05/2019 10:40 AM 0.3 mL 03/13/2019 Intramuscular   Manufacturer: [place]   Lot: [id number]   [place]: [id number]

## 2019-09-16 ENCOUNTER — Other Ambulatory Visit: Payer: Self-pay | Admitting: Neurosurgery

## 2019-09-16 DIAGNOSIS — G8929 Other chronic pain: Secondary | ICD-10-CM

## 2019-09-16 DIAGNOSIS — M5441 Lumbago with sciatica, right side: Secondary | ICD-10-CM

## 2019-09-17 ENCOUNTER — Telehealth: Payer: Self-pay | Admitting: Nurse Practitioner

## 2019-09-17 NOTE — Telephone Encounter (Signed)
Phone call to patient to verify medication list and allergies for myelogram procedure. Pt aware he will not need to hold any medications for this procedure. Pre and post procedure instructions reviewed with pt. Pt verbalized understanding.  

## 2019-10-14 ENCOUNTER — Ambulatory Visit
Admission: RE | Admit: 2019-10-14 | Discharge: 2019-10-14 | Disposition: A | Discharge status: Home / Self Care | Payer: Medicare Other | Source: Ambulatory Visit | Attending: Neurosurgery | Admitting: Neurosurgery

## 2019-10-14 ENCOUNTER — Other Ambulatory Visit: Payer: Self-pay

## 2019-10-14 VITALS — BP 127/67 | HR 63

## 2019-10-14 DIAGNOSIS — M5442 Lumbago with sciatica, left side: Secondary | ICD-10-CM

## 2019-10-14 DIAGNOSIS — G8929 Other chronic pain: Secondary | ICD-10-CM

## 2019-10-14 DIAGNOSIS — M5136 Other intervertebral disc degeneration, lumbar region: Secondary | ICD-10-CM

## 2019-10-14 DIAGNOSIS — M4316 Spondylolisthesis, lumbar region: Secondary | ICD-10-CM

## 2019-10-14 MED ORDER — IOPAMIDOL (ISOVUE-M 200) INJECTION 41%
18.0000 mL | Freq: Once | INTRAMUSCULAR | Status: AC
  Administered 2019-10-14: 18 mL via INTRATHECAL

## 2019-10-14 MED ORDER — ONDANSETRON HCL 4 MG/2ML IJ SOLN
4.0000 mg | Freq: Once | INTRAMUSCULAR | Status: AC
  Administered 2019-10-14: 4 mg via INTRAMUSCULAR

## 2019-10-14 MED ORDER — DIAZEPAM 5 MG PO TABS
5.0000 mg | ORAL_TABLET | Freq: Once | ORAL | Status: AC
  Administered 2019-10-14: 5 mg via ORAL

## 2019-10-14 MED ORDER — MEPERIDINE HCL 100 MG/ML IJ SOLN
75.0000 mg | Freq: Once | INTRAMUSCULAR | Status: AC
  Administered 2019-10-14: 75 mg via INTRAMUSCULAR

## 2019-10-20 ENCOUNTER — Other Ambulatory Visit: Payer: Self-pay

## 2019-10-20 ENCOUNTER — Emergency Department: Payer: Medicare Other

## 2019-10-20 ENCOUNTER — Emergency Department
Admission: EM | Admit: 2019-10-20 | Discharge: 2019-10-20 | Disposition: A | Discharge status: Home / Self Care | Payer: Medicare Other | Attending: Emergency Medicine | Admitting: Emergency Medicine

## 2019-10-20 DIAGNOSIS — W298XXA Contact with other powered powered hand tools and household machinery, initial encounter: Secondary | ICD-10-CM | POA: Diagnosis not present

## 2019-10-20 DIAGNOSIS — S81032A Puncture wound without foreign body, left knee, initial encounter: Secondary | ICD-10-CM | POA: Insufficient documentation

## 2019-10-20 DIAGNOSIS — I10 Essential (primary) hypertension: Secondary | ICD-10-CM | POA: Diagnosis not present

## 2019-10-20 DIAGNOSIS — E119 Type 2 diabetes mellitus without complications: Secondary | ICD-10-CM | POA: Insufficient documentation

## 2019-10-20 DIAGNOSIS — Z79899 Other long term (current) drug therapy: Secondary | ICD-10-CM | POA: Insufficient documentation

## 2019-10-20 DIAGNOSIS — S80252A Superficial foreign body, left knee, initial encounter: Secondary | ICD-10-CM | POA: Diagnosis present

## 2019-10-20 DIAGNOSIS — Y999 Unspecified external cause status: Secondary | ICD-10-CM | POA: Diagnosis not present

## 2019-10-20 DIAGNOSIS — Y929 Unspecified place or not applicable: Secondary | ICD-10-CM | POA: Insufficient documentation

## 2019-10-20 DIAGNOSIS — S91042A Puncture wound with foreign body, left ankle, initial encounter: Secondary | ICD-10-CM

## 2019-10-20 DIAGNOSIS — Y939 Activity, unspecified: Secondary | ICD-10-CM | POA: Insufficient documentation

## 2019-10-20 MED ORDER — LIDOCAINE HCL 1 % IJ SOLN
5.0000 mL | Freq: Once | INTRAMUSCULAR | Status: AC
  Administered 2019-10-20: 5 mL
  Filled 2019-10-20: qty 10

## 2019-10-20 MED ORDER — ONDANSETRON 4 MG PO TBDP: 4.0000 mg | ORAL_TABLET | Freq: Three times a day (TID) | ORAL | 0 refills | Status: AC | PRN

## 2019-10-20 MED ORDER — CEPHALEXIN 500 MG PO CAPS: 500.0000 mg | ORAL_CAPSULE | Freq: Three times a day (TID) | ORAL | 0 refills | Status: AC

## 2019-10-20 MED ORDER — OXYCODONE-ACETAMINOPHEN 5-325 MG PO TABS
1.0000 | ORAL_TABLET | Freq: Once | ORAL | Status: AC
  Administered 2019-10-20: 1 via ORAL
  Filled 2019-10-20: qty 1

## 2019-10-20 MED ORDER — ONDANSETRON 4 MG PO TBDP
4.0000 mg | ORAL_TABLET | Freq: Once | ORAL | Status: AC
  Administered 2019-10-20: 4 mg via ORAL
  Filled 2019-10-20: qty 1

## 2019-10-20 MED ORDER — OXYCODONE-ACETAMINOPHEN 5-325 MG PO TABS: 1.0000 | ORAL_TABLET | Freq: Four times a day (QID) | ORAL | 0 refills | Status: AC | PRN

## 2019-10-20 NOTE — Discharge Instructions (Signed)
Take Keflex 3 times daily for the next week. Please make a follow-up appointment with Dr. Vickki Muff soon as possible. Percocet has been prescribed for pain. Please start stool softener.

## 2019-10-20 NOTE — ED Provider Notes (Signed)
Emergency Department Provider Note  ____________________________________________  Time seen: Approximately 7:25 PM  I have reviewed the triage vital signs and the nursing notes.   HISTORY  Chief Complaint Foreign Body   Historian Patient    HPI Calvin Roth is a 74 y.o. male presents to the emergency department with a drill tip foreign body along the lateral aspect of the left ankle.  Patient states that he dropped a drill after losing his balance which embedded drill in left ankle.  Patient states that the drill tip broke and is still retained in left ankle.  No numbness or tingling in left ankle.  He reports his tetanus status was updated 2 years ago.    Past Medical History:  Diagnosis Date  . Colon polyp 2018  . Diabetes mellitus without complication ([place])   . Hypertension      Immunizations up to date:  Yes.     Past Medical History:  Diagnosis Date  . Colon polyp 2018  . Diabetes mellitus without complication ([place])   . Hypertension     Patient Active Problem List   Diagnosis Date Noted  . Spondylolisthesis of lumbar region 05/04/2015  . Degeneration of intervertebral disc of lumbar region 10/19/2014  . Neuritis or radiculitis due to rupture of lumbar intervertebral disc 10/19/2014  . Lumbar canal stenosis 10/19/2014  . Morbid obesity ([place]) 01/23/2014  . Benign prostatic hyperplasia with urinary obstruction 10/01/2013  . Type 2 diabetes mellitus ([place]) 10/01/2013  . Benign hypertension 10/01/2013  . Spinal stenosis 10/01/2013    Past Surgical History:  Procedure Laterality Date  . BACK SURGERY  2017  . CHOLECYSTECTOMY    . COLONOSCOPY WITH PROPOFOL N/A 07/17/2016   Procedure: COLONOSCOPY WITH PROPOFOL;  Surgeon: Lollie Sails, MD;  Location: [facility] ENDOSCOPY;  Service: Endoscopy;  Laterality: N/A;  . COLONOSCOPY WITH PROPOFOL N/A 02/04/2018   Procedure: COLONOSCOPY WITH PROPOFOL;  Surgeon: Lollie Sails, MD;  Location: [facility] ENDOSCOPY;   Service: Endoscopy;  Laterality: N/A;  . stomach stapled  1982   For weight loss  . TRANSANAL EXCISION OF RECTAL MASS N/A 09/18/2016   Procedure: TRANSANAL EXCISION OF RECTAL TUMOR;  Surgeon: Christene Lye, MD;  Location: [facility] ORS;  Service: General;  Laterality: N/A;    Prior to Admission medications   Medication Sig Start Date End Date Taking? Authorizing Provider  atorvastatin (LIPITOR) 10 MG tablet Take 10 mg by mouth daily.  10/27/14 02/04/18  [provider]  cephALEXin (KEFLEX) 500 MG capsule Take 1 capsule (500 mg total) by mouth 3 (three) times daily for 7 days. 10/20/19 10/27/19  Lannie Fields, PA-C  gabapentin (NEURONTIN) 400 MG capsule Take 1,200 mg by mouth at bedtime.    [provider]  ibuprofen (ADVIL,MOTRIN) 200 MG tablet Take 600-800 mg by mouth every 8 (eight) hours as needed (for pain.). Patient not taking: Reported on 09/17/2019    [provider]  LEVEMIR FLEXTOUCH 100 UNIT/ML Pen Inject 20 Units into the skin 2 (two) times daily.  03/19/15   [provider]  losartan-hydrochlorothiazide (HYZAAR) 100-12.5 MG tablet Take 1 tablet by mouth daily.  10/27/14   [provider]  metFORMIN (GLUCOPHAGE-XR) 500 MG 24 hr tablet Take 500 mg by mouth 2 (two) times daily.     [provider]  omeprazole (PRILOSEC) 20 MG capsule Take 20 mg by mouth daily.    [provider]  ondansetron (ZOFRAN ODT) 4 MG disintegrating tablet Take 1 tablet (4 mg total)  by mouth every 8 (eight) hours as needed for up to 5 days. 10/20/19 10/25/19  Lannie Fields, PA-C  oxyCODONE-acetaminophen (PERCOCET/ROXICET) 5-325 MG tablet Take 1 tablet by mouth every 6 (six) hours as needed for up to 3 days. 10/20/19 10/23/19  Lannie Fields, PA-C    Allergies Patient has no known allergies.  Family History  Problem Relation Age of Onset  . Colon cancer Neg Hx   . Breast cancer Neg Hx     Social History Social History   Tobacco Use  .  Smoking status: Never Smoker  . Smokeless tobacco: Never Used  Vaping Use  . Vaping Use: Never used  Substance Use Topics  . Alcohol use: Yes    Comment: 6 beersa wek  . Drug use: No     Review of Systems  Constitutional: No fever/chills Eyes:  No discharge ENT: No upper respiratory complaints. Respiratory: no cough. No SOB/ use of accessory muscles to breath Gastrointestinal:   No nausea, no vomiting.  No diarrhea.  No constipation. Musculoskeletal: Patient has left ankle pain.  Skin: Negative for rash, abrasions, lacerations, ecchymosis.    ____________________________________________   PHYSICAL EXAM:  VITAL SIGNS: ED Triage Vitals  Enc Vitals Group     BP 10/20/19 1608 (!) 162/80     Pulse Rate 10/20/19 1608 84     Resp 10/20/19 1608 18     Temp 10/20/19 1608 98.3 F (36.8 C)     Temp Source 10/20/19 1608 Oral     SpO2 10/20/19 1608 96 %     Weight 10/20/19 1609 245 lb (111.1 kg)     Height 10/20/19 1609 5\' 11"  (1.803 m)     Head Circumference --      Peak Flow --      Pain Score 10/20/19 1609 3     Pain Loc --      Pain Edu? --      Excl. in [place]? --      Constitutional: Alert and oriented. Well appearing and in no acute distress. Eyes: Conjunctivae are normal. PERRL. EOMI. Head: Atraumatic. Cardiovascular: Normal rate, regular rhythm. Normal S1 and S2.  Good peripheral circulation. Respiratory: Normal respiratory effort without tachypnea or retractions. Lungs CTAB. Good air entry to the bases with no decreased or absent breath sounds Gastrointestinal: Bowel sounds x 4 quadrants. Soft and nontender to palpation. No guarding or rigidity. No distention. Musculoskeletal: Full range of motion to all extremities. No obvious deformities noted Neurologic:  Normal for age. No gross focal neurologic deficits are appreciated.  Skin:  Skin is warm, dry and intact. No rash noted. Psychiatric: Mood and affect are normal for age. Speech and behavior are normal.    ____________________________________________   LABS (all labs ordered are listed, but only abnormal results are displayed)  Labs Reviewed - No data to display ____________________________________________  EKG   ____________________________________________  RADIOLOGY Unk Pinto, personally viewed and evaluated these images (plain radiographs) as part of my medical decision making, as well as reviewing the written report by the radiologist.  DG Ankle 2 Views Left  Result Date: 10/20/2019 CLINICAL DATA:  Foreign body drop drill on left foot EXAM: LEFT ANKLE - 2 VIEW COMPARISON:  None. FINDINGS: Vascular calcifications. No fracture is visualized. Ankle mortise is symmetric. 2.8 cm metallic foreign body oriented obliquely lateral to medial and cranial to caudal and projecting over the upper posterior calcaneus. Adjacent punctate metallic fragment within the soft tissues posterior to the ankle. IMPRESSION:  1. 2.8 cm metallic foreign body projecting over the upper posterior calcaneus with smaller punctate metallic foreign body within the soft tissues of the posterior ankle. 2. Vascular calcifications. Electronically Signed   By: Donavan Foil M.D.   On: 10/20/2019 17:26   CT Ankle Left Wo Contrast  Result Date: 10/20/2019 CLINICAL DATA:  Ankle pain after injury EXAM: CT OF THE LEFT ANKLE WITHOUT CONTRAST TECHNIQUE: Multidetector CT imaging of the left ankle was performed according to the standard protocol. Multiplanar CT image reconstructions were also generated. COMPARISON:  Radiograph same day FINDINGS: Bones/Joint/Cartilage There is a 2.5 cm metallic near drill bit seen imbedded within the superior posterior left calcaneus extending into the midbody. Overlying subcutaneous emphysema and soft tissue swelling is seen. No other acute fracture or dislocation is seen. Tibiotalar joint osteoarthritis joint space loss and anterior osteophyte formation. There is diffuse osteopenia. Ligaments  Suboptimally assessed by CT. Muscles and Tendons The muscles are intact without evidence of focal atrophy tear. The extensor and flexor tendons are intact. The Achilles tendon is intact. Soft tissues Surrounding the embedded drove the there is surrounding subcutaneous emphysema and soft tissue swelling. IMPRESSION: 2.5 cm metallic drill bit extending within the posterior superior left calcaneal soft tissue and into the calcaneus body. No surrounding osseous fracture. The peroneal and Achilles tendons appear to be intact. Electronically Signed   By: Prudencio Pair M.D.   On: 10/20/2019 19:55    ____________________________________________    PROCEDURES  Procedure(s) performed:     .Foreign Body Removal  Date/Time: 10/20/2019 8:51 PM Performed by: Lannie Fields, PA-C Authorized by: Vallarie Mare M, PA-C  Intake: left ankle  Anesthesia: local infiltration  Anesthesia: Anesthetic total: 5 mL  Sedation: Patient sedated: no  Patient restrained: no Number of foreign bodies recovered: None. Foreign bodies recovered: None. Post-procedure assessment: foreign body not removed       Medications  lidocaine (XYLOCAINE) 1 % (with pres) injection 5 mL (5 mLs Infiltration Given 10/20/19 1830)  oxyCODONE-acetaminophen (PERCOCET/ROXICET) 5-325 MG per tablet 1 tablet (1 tablet Oral Given 10/20/19 1957)  ondansetron (ZOFRAN-ODT) disintegrating tablet 4 mg (4 mg Oral Given 10/20/19 1957)     ____________________________________________   INITIAL IMPRESSION / ASSESSMENT AND PLAN / ED COURSE  Pertinent labs & imaging results that were available during my care of the patient were reviewed by me and considered in my medical decision making (see chart for details).      Assessment and Plan:  74 year old male presents to the emergency department with a retained drill bit in the lateral aspect of the left ankle.  Patient was hypertensive at triage but vital signs were otherwise reassuring.  2  attempts were made at foreign body removal in the emergency department with no success.  I contacted podiatrist on-call, Dr. Vickki Muff who requested a CT of the ankle as he has concern for foreign body within calcaneus.  CT left ankle reveals metallic foreign body embedded within the calcaneus.  Dr. Vickki Muff plans on seeing patient in the office on Thursday with anticipation of taking patient to the OR.  Percocet was prescribed for pain.  Tetanus status was updated 2 years ago.  Patient's wound was flushed and irrigated with normal saline and Betadine.  He was discharged with Keflex to be taken 3 times daily over the next week.  ____________________________________________  FINAL CLINICAL IMPRESSION(S) / ED DIAGNOSES  Final diagnoses:  Puncture wound of left ankle with foreign body, initial encounter      NEW MEDICATIONS  STARTED DURING THIS VISIT:  ED Discharge Orders         Ordered    oxyCODONE-acetaminophen (PERCOCET/ROXICET) 5-325 MG tablet  Every 6 hours PRN     Discontinue  Reprint     10/20/19 2022    ondansetron (ZOFRAN ODT) 4 MG disintegrating tablet  Every 8 hours PRN     Discontinue  Reprint     10/20/19 2022    cephALEXin (KEFLEX) 500 MG capsule  3 times daily     Discontinue  Reprint     10/20/19 2023              This chart was dictated using voice recognition software/Dragon. Despite best efforts to proofread, errors can occur which can change the meaning. Any change was purely unintentional.     Karren Cobble 10/20/19 2053    Harvest Dark, MD 10/20/19 [phone]

## 2019-10-20 NOTE — ED Triage Notes (Signed)
Pt states he dropped a drill and stuck into his ankle and a piece of the drill bit broke off

## 2019-10-22 ENCOUNTER — Other Ambulatory Visit: Payer: Self-pay

## 2019-10-22 ENCOUNTER — Other Ambulatory Visit
Admission: RE | Admit: 2019-10-22 | Discharge: 2019-10-22 | Disposition: A | Discharge status: Home / Self Care | Payer: Medicare Other | Source: Ambulatory Visit | Attending: Podiatry | Admitting: Podiatry

## 2019-10-22 DIAGNOSIS — Z01812 Encounter for preprocedural laboratory examination: Secondary | ICD-10-CM | POA: Insufficient documentation

## 2019-10-22 DIAGNOSIS — Z20822 Contact with and (suspected) exposure to covid-19: Secondary | ICD-10-CM | POA: Insufficient documentation

## 2019-10-22 LAB — SARS CORONAVIRUS 2 (TAT 6-24 HRS): SARS Coronavirus 2: NEGATIVE

## 2019-10-23 ENCOUNTER — Ambulatory Visit
Admission: RE | Admit: 2019-10-23 | Discharge: 2019-10-23 | Disposition: A | Discharge status: Home / Self Care | Payer: Medicare Other | Attending: Podiatry | Admitting: Podiatry

## 2019-10-23 ENCOUNTER — Ambulatory Visit: Payer: Medicare Other

## 2019-10-23 ENCOUNTER — Ambulatory Visit: Payer: Medicare Other | Admitting: Anesthesiology

## 2019-10-23 ENCOUNTER — Encounter: Payer: Self-pay | Admitting: Podiatry

## 2019-10-23 ENCOUNTER — Other Ambulatory Visit: Payer: Self-pay

## 2019-10-23 ENCOUNTER — Encounter
Admission: RE | Disposition: A | Discharge status: Home / Self Care | Payer: Self-pay | Source: Home / Self Care | Attending: Podiatry

## 2019-10-23 DIAGNOSIS — E782 Mixed hyperlipidemia: Secondary | ICD-10-CM | POA: Diagnosis not present

## 2019-10-23 DIAGNOSIS — S91342A Puncture wound with foreign body, left foot, initial encounter: Secondary | ICD-10-CM | POA: Diagnosis present

## 2019-10-23 DIAGNOSIS — W311XXA Contact with metalworking machines, initial encounter: Secondary | ICD-10-CM | POA: Diagnosis not present

## 2019-10-23 DIAGNOSIS — Z833 Family history of diabetes mellitus: Secondary | ICD-10-CM | POA: Insufficient documentation

## 2019-10-23 DIAGNOSIS — I10 Essential (primary) hypertension: Secondary | ICD-10-CM | POA: Diagnosis not present

## 2019-10-23 DIAGNOSIS — Z794 Long term (current) use of insulin: Secondary | ICD-10-CM | POA: Diagnosis not present

## 2019-10-23 DIAGNOSIS — E669 Obesity, unspecified: Secondary | ICD-10-CM | POA: Diagnosis not present

## 2019-10-23 DIAGNOSIS — I712 Thoracic aortic aneurysm, without rupture: Secondary | ICD-10-CM | POA: Insufficient documentation

## 2019-10-23 DIAGNOSIS — E11311 Type 2 diabetes mellitus with unspecified diabetic retinopathy with macular edema: Secondary | ICD-10-CM | POA: Insufficient documentation

## 2019-10-23 DIAGNOSIS — E119 Type 2 diabetes mellitus without complications: Secondary | ICD-10-CM | POA: Insufficient documentation

## 2019-10-23 DIAGNOSIS — H532 Diplopia: Secondary | ICD-10-CM | POA: Diagnosis not present

## 2019-10-23 DIAGNOSIS — Z79899 Other long term (current) drug therapy: Secondary | ICD-10-CM | POA: Insufficient documentation

## 2019-10-23 DIAGNOSIS — Z6835 Body mass index (BMI) 35.0-35.9, adult: Secondary | ICD-10-CM | POA: Diagnosis not present

## 2019-10-23 DIAGNOSIS — Z7982 Long term (current) use of aspirin: Secondary | ICD-10-CM | POA: Diagnosis not present

## 2019-10-23 DIAGNOSIS — Z8249 Family history of ischemic heart disease and other diseases of the circulatory system: Secondary | ICD-10-CM | POA: Diagnosis not present

## 2019-10-23 HISTORY — PX: FOREIGN BODY REMOVAL: SHX962

## 2019-10-23 LAB — CBC
HCT: 46.1 % (ref 39.0–52.0)
Hemoglobin: 15.4 g/dL (ref 13.0–17.0)
MCH: 31.3 pg (ref 26.0–34.0)
MCHC: 33.4 g/dL (ref 30.0–36.0)
MCV: 93.7 fL (ref 80.0–100.0)
Platelets: 206 10*3/uL (ref 150–400)
RBC: 4.92 MIL/uL (ref 4.22–5.81)
RDW: 12.4 % (ref 11.5–15.5)
WBC: 6.2 10*3/uL (ref 4.0–10.5)
nRBC: 0 % (ref 0.0–0.2)

## 2019-10-23 LAB — COMPREHENSIVE METABOLIC PANEL
ALT: 18 U/L (ref 0–44)
AST: 19 U/L (ref 15–41)
Albumin: 4.1 g/dL (ref 3.5–5.0)
Alkaline Phosphatase: 98 U/L (ref 38–126)
Anion gap: 9 (ref 5–15)
BUN: 18 mg/dL (ref 8–23)
CO2: 26 mmol/L (ref 22–32)
Calcium: 9.4 mg/dL (ref 8.9–10.3)
Chloride: 102 mmol/L (ref 98–111)
Creatinine, Ser: 1.32 mg/dL — ABNORMAL HIGH (ref 0.61–1.24)
GFR calc Af Amer: >60 mL/min (ref >60)
GFR calc non Af Amer: 54 mL/min — ABNORMAL LOW (ref >60)
Glucose, Bld: 139 mg/dL — ABNORMAL HIGH (ref 70–99)
Potassium: 4.3 mmol/L (ref 3.5–5.1)
Sodium: 137 mmol/L (ref 135–145)
Total Bilirubin: 1 mg/dL (ref 0.3–1.2)
Total Protein: 7.5 g/dL (ref 6.5–8.1)

## 2019-10-23 LAB — GLUCOSE, CAPILLARY
Glucose-Capillary: 130 mg/dL — ABNORMAL HIGH (ref 70–99)
Glucose-Capillary: 126 mg/dL — ABNORMAL HIGH (ref 70–99)

## 2019-10-23 SURGERY — REMOVAL FOREIGN BODY EXTREMITY
Anesthesia: General | Site: Foot | Laterality: Left

## 2019-10-23 MED ORDER — FENTANYL CITRATE (PF) 250 MCG/5ML IJ SOLN
INTRAMUSCULAR | Status: AC
  Filled 2019-10-23: qty 5

## 2019-10-23 MED ORDER — OXYCODONE HCL 5 MG PO TABS: 5.0000 mg | ORAL_TABLET | Freq: Once | ORAL | Status: DC | PRN

## 2019-10-23 MED ORDER — FAMOTIDINE 20 MG PO TABS: 20.0000 mg | ORAL_TABLET | Freq: Once | ORAL | Status: AC

## 2019-10-23 MED ORDER — DEXAMETHASONE SODIUM PHOSPHATE 10 MG/ML IJ SOLN
INTRAMUSCULAR | Status: AC
  Filled 2019-10-23: qty 1

## 2019-10-23 MED ORDER — ONDANSETRON HCL 4 MG/2ML IJ SOLN
INTRAMUSCULAR | Status: DC | PRN
  Administered 2019-10-23: 4 mg via INTRAVENOUS

## 2019-10-23 MED ORDER — LIDOCAINE HCL (PF) 1 % IJ SOLN
INTRAMUSCULAR | Status: AC
  Filled 2019-10-23: qty 30

## 2019-10-23 MED ORDER — PROPOFOL 10 MG/ML IV BOLUS
INTRAVENOUS | Status: DC | PRN
  Administered 2019-10-23: 150 mg via INTRAVENOUS

## 2019-10-23 MED ORDER — BUPIVACAINE HCL 0.5 % IJ SOLN
INTRAMUSCULAR | Status: DC | PRN
  Administered 2019-10-23: 5 mL

## 2019-10-23 MED ORDER — PHENYLEPHRINE HCL (PRESSORS) 10 MG/ML IV SOLN
INTRAVENOUS | Status: DC | PRN
  Administered 2019-10-23 (×2): 100 ug via INTRAVENOUS
  Administered 2019-10-23: 200 ug via INTRAVENOUS
  Administered 2019-10-23: 100 ug via INTRAVENOUS
  Administered 2019-10-23: 50 ug via INTRAVENOUS

## 2019-10-23 MED ORDER — ACETAMINOPHEN 10 MG/ML IV SOLN: 1000.0000 mg | Freq: Once | INTRAVENOUS | Status: DC | PRN

## 2019-10-23 MED ORDER — SODIUM CHLORIDE 0.9 % IV SOLN: INTRAVENOUS | Status: DC

## 2019-10-23 MED ORDER — CEFAZOLIN SODIUM-DEXTROSE 2-4 GM/100ML-% IV SOLN
2.0000 g | INTRAVENOUS | Status: AC
  Administered 2019-10-23: 2 g via INTRAVENOUS

## 2019-10-23 MED ORDER — ONDANSETRON HCL 4 MG/2ML IJ SOLN
INTRAMUSCULAR | Status: AC
  Filled 2019-10-23: qty 2

## 2019-10-23 MED ORDER — LIDOCAINE-EPINEPHRINE 1 %-1:100000 IJ SOLN
INTRAMUSCULAR | Status: AC
  Filled 2019-10-23: qty 1

## 2019-10-23 MED ORDER — BUPIVACAINE LIPOSOME 1.3 % IJ SUSP
INTRAMUSCULAR | Status: AC
  Filled 2019-10-23: qty 20

## 2019-10-23 MED ORDER — SUCCINYLCHOLINE CHLORIDE 20 MG/ML IJ SOLN
INTRAMUSCULAR | Status: DC | PRN
  Administered 2019-10-23: 60 mg via INTRAVENOUS

## 2019-10-23 MED ORDER — CEFAZOLIN SODIUM-DEXTROSE 2-4 GM/100ML-% IV SOLN
INTRAVENOUS | Status: AC
  Filled 2019-10-23: qty 100

## 2019-10-23 MED ORDER — CHLORHEXIDINE GLUCONATE 0.12 % MT SOLN
OROMUCOSAL | Status: AC
  Administered 2019-10-23: 15 mL via OROMUCOSAL
  Filled 2019-10-23: qty 15

## 2019-10-23 MED ORDER — FAMOTIDINE 20 MG PO TABS
ORAL_TABLET | ORAL | Status: AC
  Administered 2019-10-23: 20 mg via ORAL
  Filled 2019-10-23: qty 1

## 2019-10-23 MED ORDER — LIDOCAINE-EPINEPHRINE 1 %-1:100000 IJ SOLN
INTRAMUSCULAR | Status: DC | PRN
  Administered 2019-10-23: 5 mL

## 2019-10-23 MED ORDER — ACETAMINOPHEN 10 MG/ML IV SOLN
INTRAVENOUS | Status: AC
  Filled 2019-10-23: qty 100

## 2019-10-23 MED ORDER — OXYCODONE HCL 5 MG/5ML PO SOLN: 5.0000 mg | Freq: Once | ORAL | Status: DC | PRN

## 2019-10-23 MED ORDER — FENTANYL CITRATE (PF) 100 MCG/2ML IJ SOLN: 25.0000 ug | INTRAMUSCULAR | Status: DC | PRN

## 2019-10-23 MED ORDER — CHLORHEXIDINE GLUCONATE 0.12 % MT SOLN: 15.0000 mL | Freq: Once | OROMUCOSAL | Status: AC

## 2019-10-23 MED ORDER — ONDANSETRON HCL 4 MG/2ML IJ SOLN: 4.0000 mg | Freq: Once | INTRAMUSCULAR | Status: DC | PRN

## 2019-10-23 MED ORDER — DEXAMETHASONE SODIUM PHOSPHATE 10 MG/ML IJ SOLN
INTRAMUSCULAR | Status: DC | PRN
  Administered 2019-10-23: 4 mg via INTRAVENOUS

## 2019-10-23 MED ORDER — ACETAMINOPHEN 10 MG/ML IV SOLN
INTRAVENOUS | Status: DC | PRN
  Administered 2019-10-23: 1000 mg via INTRAVENOUS

## 2019-10-23 MED ORDER — EPHEDRINE SULFATE 50 MG/ML IJ SOLN
INTRAMUSCULAR | Status: DC | PRN
  Administered 2019-10-23: 5 mg via INTRAVENOUS
  Administered 2019-10-23: 10 mg via INTRAVENOUS

## 2019-10-23 MED ORDER — FENTANYL CITRATE (PF) 100 MCG/2ML IJ SOLN
INTRAMUSCULAR | Status: DC | PRN
  Administered 2019-10-23: 100 ug via INTRAVENOUS

## 2019-10-23 MED ORDER — BUPIVACAINE HCL (PF) 0.5 % IJ SOLN
INTRAMUSCULAR | Status: AC
  Filled 2019-10-23: qty 30

## 2019-10-23 MED ORDER — ORAL CARE MOUTH RINSE: 15.0000 mL | Freq: Once | OROMUCOSAL | Status: AC

## 2019-10-23 MED ORDER — LIDOCAINE HCL (CARDIAC) PF 100 MG/5ML IV SOSY
PREFILLED_SYRINGE | INTRAVENOUS | Status: DC | PRN
  Administered 2019-10-23: 100 mg via INTRAVENOUS

## 2019-10-23 SURGICAL SUPPLY — 62 items
BLADE OSC/SAGITTAL MD 5.5X18 (BLADE) IMPLANT
BLADE OSCILLATING/SAGITTAL (BLADE)
BLADE SW THK.38XMED LNG THN (BLADE) IMPLANT
BNDG COHESIVE 4X5 TAN STRL (GAUZE/BANDAGES/DRESSINGS) ×2 IMPLANT
BNDG COHESIVE 6X5 TAN STRL LF (GAUZE/BANDAGES/DRESSINGS) ×2 IMPLANT
BNDG CONFORM 2 STRL LF (GAUZE/BANDAGES/DRESSINGS) ×1 IMPLANT
BNDG CONFORM 3 STRL LF (GAUZE/BANDAGES/DRESSINGS) ×3 IMPLANT
BNDG ELASTIC 4X5.8 VLCR STR LF (GAUZE/BANDAGES/DRESSINGS) ×1 IMPLANT
BNDG ESMARK 4X12 TAN STRL LF (GAUZE/BANDAGES/DRESSINGS) ×2 IMPLANT
BNDG GAUZE 4.5X4.1 6PLY STRL (MISCELLANEOUS) ×2 IMPLANT
CANISTER SUCT 1200ML W/VALVE (MISCELLANEOUS) ×2 IMPLANT
CANISTER SUCT 3000ML PPV (MISCELLANEOUS) ×2 IMPLANT
COVER WAND RF STERILE (DRAPES) ×2 IMPLANT
CUFF TOURN SGL QUICK 12 (TOURNIQUET CUFF) IMPLANT
CUFF TOURN SGL QUICK 18X4 (TOURNIQUET CUFF) ×1 IMPLANT
DRAPE FLUOR MINI C-ARM 54X84 (DRAPES) IMPLANT
DRAPE XRAY CASSETTE 23X24 (DRAPES) IMPLANT
DRSG MEPILEX FLEX 3X3 (GAUZE/BANDAGES/DRESSINGS) IMPLANT
DURAPREP 26ML APPLICATOR (WOUND CARE) ×2 IMPLANT
ELECT REM PT RETURN 9FT ADLT (ELECTROSURGICAL) ×2
ELECTRODE REM PT RTRN 9FT ADLT (ELECTROSURGICAL) ×1 IMPLANT
GAUZE PACKING 1/4 X5 YD (GAUZE/BANDAGES/DRESSINGS) ×1 IMPLANT
GAUZE PACKING IODOFORM 1X5 (PACKING) ×2 IMPLANT
GAUZE SPONGE 4X4 12PLY STRL (GAUZE/BANDAGES/DRESSINGS) ×2 IMPLANT
GAUZE XEROFORM 1X8 LF (GAUZE/BANDAGES/DRESSINGS) ×2 IMPLANT
GLOVE BIO SURGEON STRL SZ7.5 (GLOVE) ×2 IMPLANT
GLOVE INDICATOR 8.0 STRL GRN (GLOVE) ×2 IMPLANT
GOWN STRL REUS W/ TWL XL LVL3 (GOWN DISPOSABLE) ×2 IMPLANT
GOWN STRL REUS W/TWL MED LVL3 (GOWN DISPOSABLE) ×4 IMPLANT
GOWN STRL REUS W/TWL XL LVL3 (GOWN DISPOSABLE) ×4
HANDPIECE VERSAJET DEBRIDEMENT (MISCELLANEOUS) IMPLANT
IV NS 1000ML (IV SOLUTION) ×2
IV NS 1000ML BAXH (IV SOLUTION) ×1 IMPLANT
KIT TURNOVER KIT A (KITS) ×2 IMPLANT
LABEL OR SOLS (LABEL) ×2 IMPLANT
NDL FILTER BLUNT 18X1 1/2 (NEEDLE) ×1 IMPLANT
NDL HYPO 25X1 1.5 SAFETY (NEEDLE) ×1 IMPLANT
NEEDLE FILTER BLUNT 18X 1/2SAF (NEEDLE) ×1
NEEDLE FILTER BLUNT 18X1 1/2 (NEEDLE) ×1 IMPLANT
NEEDLE HYPO 25X1 1.5 SAFETY (NEEDLE) ×2 IMPLANT
NS IRRIG 500ML POUR BTL (IV SOLUTION) ×2 IMPLANT
PACK EXTREMITY (MISCELLANEOUS) ×2 IMPLANT
PAD ABD DERMACEA PRESS 5X9 (GAUZE/BANDAGES/DRESSINGS) ×1 IMPLANT
PULSAVAC PLUS IRRIG FAN TIP (DISPOSABLE)
RASP SM TEAR CROSS CUT (RASP) IMPLANT
SHIELD FULL FACE ANTIFOG 7M (MISCELLANEOUS) ×2 IMPLANT
SOL .9 NS 3000ML IRR  AL (IV SOLUTION)
SOL .9 NS 3000ML IRR AL (IV SOLUTION)
SOL .9 NS 3000ML IRR UROMATIC (IV SOLUTION) ×1 IMPLANT
SOL PREP PVP 2OZ (MISCELLANEOUS) ×2
SOLUTION PREP PVP 2OZ (MISCELLANEOUS) ×1 IMPLANT
STOCKINETTE IMPERVIOUS 9X36 MD (GAUZE/BANDAGES/DRESSINGS) ×2 IMPLANT
SUT ETHILON 2 0 FS 18 (SUTURE) ×2 IMPLANT
SUT ETHILON 4-0 (SUTURE) ×2
SUT ETHILON 4-0 FS2 18XMFL BLK (SUTURE) ×1
SUT VIC AB 3-0 SH 27 (SUTURE) ×2
SUT VIC AB 3-0 SH 27X BRD (SUTURE) ×1 IMPLANT
SUT VIC AB 4-0 FS2 27 (SUTURE) ×2 IMPLANT
SUTURE ETHLN 4-0 FS2 18XMF BLK (SUTURE) ×1 IMPLANT
SWAB CULTURE AMIES ANAERIB BLU (MISCELLANEOUS) ×1 IMPLANT
SYR 10ML LL (SYRINGE) ×4 IMPLANT
TIP FAN IRRIG PULSAVAC PLUS (DISPOSABLE) ×1 IMPLANT

## 2019-10-23 NOTE — Anesthesia Preprocedure Evaluation (Signed)
Anesthesia Evaluation  Patient identified by MRN, date of birth, ID band Patient awake    Reviewed: Allergy & Precautions, NPO status , Patient's Chart, lab work & pertinent test results  History of Anesthesia Complications Negative for: history of anesthetic complications  Airway Mallampati: II  TM Distance: >3 FB Neck ROM: Full    Dental no notable dental hx. (+) Teeth Intact, Dental Advisory Given   Pulmonary neg pulmonary ROS, neg sleep apnea, neg COPD, Patient abstained from smoking.Not current smoker,    Pulmonary exam normal breath sounds clear to auscultation       Cardiovascular Exercise Tolerance: Good METShypertension, (-) CAD and (-) Past MI (-) dysrhythmias  Rhythm:Regular Rate:Normal - Systolic murmurs    Neuro/Psych negative neurological ROS  negative psych ROS   GI/Hepatic neg GERD  ,(+)     (-) substance abuse  ,   Endo/Other  diabetes, Insulin Dependent  Renal/GU negative Renal ROS     Musculoskeletal   Abdominal   Peds  Hematology   Anesthesia Other Findings Past Medical History: 2018: Colon polyp No date: Diabetes mellitus without complication (HCC) No date: Hypertension  Reproductive/Obstetrics                             Anesthesia Physical Anesthesia Plan  ASA: II  Anesthesia Plan: General   Post-op Pain Management:    Induction: Intravenous  PONV Risk Score and Plan: 2 and Ondansetron and Dexamethasone  Airway Management Planned: Oral ETT  Additional Equipment: None  Intra-op Plan:   Post-operative Plan: Extubation in OR  Informed Consent: I have reviewed the patients History and Physical, chart, labs and discussed the procedure including the risks, benefits and alternatives for the proposed anesthesia with the patient or authorized representative who has indicated his/her understanding and acceptance.     Dental advisory given  Plan  Discussed with: CRNA and Surgeon  Anesthesia Plan Comments: (Discussed risks of anesthesia with patient, including PONV, sore throat, lip/dental damage. Rare risks discussed as well, such as cardiorespiratory and neurological sequelae. Patient understands.)        Anesthesia Quick Evaluation

## 2019-10-23 NOTE — Transfer of Care (Signed)
Immediate Anesthesia Transfer of Care Note  Patient: SONG GARRIS  Procedure(s) Performed: REMOVAL FOREIGN BODY LEFT HEEL (Left Foot)  Patient Location: PACU  Anesthesia Type:General  Level of Consciousness: awake, alert  and oriented  Airway & Oxygen Therapy: Patient Spontanous Breathing and Patient connected to face mask oxygen  Post-op Assessment: Report given to RN and Post -op Vital signs reviewed and stable  Post vital signs: Reviewed and stable  Last Vitals:  Vitals Value Taken Time  BP    Temp    Pulse 67 10/23/19 1148  Resp 13 10/23/19 1148  SpO2 98 % 10/23/19 1148  Vitals shown include unvalidated device data.  Last Pain:  Vitals:   10/23/19 0950  PainSc: 0-No pain         Complications: No complications documented.

## 2019-10-23 NOTE — H&P (Signed)
HISTORY AND PHYSICAL INTERVAL NOTE:  10/23/2019  10:27 AM  Calvin Roth  has presented today for surgery, with the diagnosis of [place].  The various methods of treatment have been discussed with the patient.  No guarantees were given.  After consideration of risks, benefits and other options for treatment, the patient has consented to surgery.  I have reviewed the patients' chart and labs.     Roth history and physical examination was performed in my office.  The patient was reexamined.  There have been no changes to this history and physical examination.  Calvin Roth

## 2019-10-23 NOTE — Op Note (Signed)
Operative note   Surgeon:Jaquise Faux Lawyer: None    Preop diagnosis: Foreign body deep left calcaneus    Postop diagnosis: Same    Procedure: Excision foreign body left calcaneus    EBL: Minimal    Anesthesia:general with local.  Local consisted of a one-to-one mixture of 0.5% Marcaine and 1% lidocaine with epinephrine.  A total of 5 cc was used    Hemostasis: Mid calf tourniquet inflated to 200 mmHg for approximately 7 minutes    Specimen: Foreign body/drillbit left heel    Complications: None    Operative indications:Calvin Roth is an 74 y.o. that presents today for surgical intervention.  The risks/benefits/alternatives/complications have been discussed and consent has been given.    Procedure:  Patient was brought into the OR and placed on the operating table in thelateral position. After anesthesia was obtained theleft lower extremity was prepped and draped in usual sterile fashion.  Attention was directed to the left posterior calcaneus where the foreign body was mapped out with the use of fluoroscopy initially.  A small incision was made superficially.  This was dissected down deep to the level of the calcaneus.  At this time dissection was carried out and I was able to identify the drill bit.  I was then able to remove the drill bit from the deep calcaneus at this time.  No obvious purulence was noted.  A deep wound culture was performed.  The wound was flushed with 500 cc of sterile saline.  Single suture was removed to close the superficial skin with a 3-0 nylon.  I did pack the wound at the more superior aspect with 1/4 inch iodoform packing strip.  A bulky sterile dressing was then applied.    Patient tolerated the procedure and anesthesia well.  Was transported from the OR to the PACU with all vital signs stable and vascular status intact. To be discharged per routine protocol.  Will follow up in approximately 1 week in the outpatient clinic.  He will continue  with Keflex at this time.  Depending on wound culture we may change if needed.

## 2019-10-23 NOTE — Anesthesia Postprocedure Evaluation (Signed)
Anesthesia Post Note  Patient: Calvin Roth  Procedure(s) Performed: REMOVAL FOREIGN BODY LEFT HEEL (Left Foot)  Patient location during evaluation: PACU Anesthesia Type: General Level of consciousness: awake and alert Pain management: pain level controlled Vital Signs Assessment: post-procedure vital signs reviewed and stable Respiratory status: spontaneous breathing, nonlabored ventilation, respiratory function stable and patient connected to nasal cannula oxygen Cardiovascular status: blood pressure returned to baseline and stable Postop Assessment: no apparent nausea or vomiting Anesthetic complications: no   No complications documented.   Last Vitals:  Vitals:   10/23/19 1200 10/23/19 1201  BP: (!) 185/92 (!) 152/88  Pulse: 69 69  Resp: 14 15  Temp:    SpO2: 100% 97%    Last Pain:  Vitals:   10/23/19 1201  PainSc: 0-No pain                 Arita Miss

## 2019-10-23 NOTE — Anesthesia Procedure Notes (Signed)
Procedure Name: Intubation Date/Time: 10/23/2019 10:53 AM Performed by: Genevie Ann, CRNA Pre-anesthesia Checklist: Patient identified, Emergency Drugs available, Suction available, Patient being monitored and Timeout performed Patient Re-evaluated:Patient Re-evaluated prior to induction Oxygen Delivery Method: Circle system utilized Preoxygenation: Pre-oxygenation with 100% oxygen Induction Type: IV induction Grade View: Grade II Tube size: 7.5 mm Number of attempts: 1 Airway Equipment and Method: Stylet Secured at: 22 cm

## 2019-10-23 NOTE — Discharge Instructions (Addendum)
[place] DR. TROXLER, DR. Vickki Muff, AND DR. [place]   1. Take your medication as prescribed.  Pain medication should be taken only as needed.  2. Keep the dressing clean, dry and intact for 48 hours.  OK to remove dressing and replace with guaze and cover with light wrap.  3. Keep your foot elevated above the heart level for the first 48 hours.  4. Walking to the bathroom and brief periods of walking are acceptable, unless we have instructed you to be non-weight bearing.  5. Always wear your post-op shoe when walking.  Always use your crutches if you are to be non-weight bearing.  6. Do not take a shower. Baths are permissible as long as the foot is kept out of the water.   7. Every hour you are awake:  - Bend your knee 15 times. - Flex foot 15 times - Massage calf 15 times  8. Call [facility] [phone]) if any of the following problems occur: - You develop a temperature or fever. - The bandage becomes saturated with blood. - Medication does not stop your pain. - Injury of the foot occurs. - Any symptoms of infection including redness, odor, or red streaks running from wound.    AMBULATORY SURGERY  DISCHARGE INSTRUCTIONS   1) The drugs that you were given will stay in your system until tomorrow so for the next 24 hours you should not:  A) Drive an automobile B) Make any legal decisions C) Drink any alcoholic beverage   2) You may resume regular meals tomorrow.  Today it is better to start with liquids and gradually work up to solid foods.  You may eat anything you prefer, but it is better to start with liquids, then soup and crackers, and gradually work up to solid foods.   3) Please notify your doctor immediately if you have any unusual bleeding, trouble breathing, redness and pain at the surgery site, drainage, fever, or pain not relieved by  medication.    4) Additional Instructions:   Please contact your physician with any problems or Same Day Surgery at [phone], Monday through Friday 6 am to 4 pm, or [place] at [facility] number at [phone].

## 2019-10-23 NOTE — Anesthesia Procedure Notes (Signed)
Date/Time: 10/23/2019 11:52 AM Performed by: Genevie Ann, CRNA

## 2019-10-24 ENCOUNTER — Encounter: Payer: Self-pay | Admitting: Podiatry

## 2019-10-26 NOTE — Addendum Note (Signed)
Addendum  created 10/26/19 1015 by Doreen Salvage, CRNA   Charge Capture section accepted

## 2019-10-28 LAB — AEROBIC/ANAEROBIC CULTURE W GRAM STAIN (SURGICAL/DEEP WOUND): Culture: NO GROWTH

## 2020-01-07 ENCOUNTER — Other Ambulatory Visit: Payer: Self-pay | Admitting: Neurosurgery

## 2020-02-02 ENCOUNTER — Other Ambulatory Visit: Payer: Self-pay | Admitting: Neurosurgery

## 2020-02-08 ENCOUNTER — Other Ambulatory Visit: Payer: Self-pay

## 2020-02-08 ENCOUNTER — Encounter (HOSPITAL_COMMUNITY)
Admission: RE | Admit: 2020-02-08 | Discharge: 2020-02-08 | Disposition: A | Discharge status: Home / Self Care | Payer: Medicare Other | Source: Ambulatory Visit | Attending: Neurosurgery | Admitting: Neurosurgery

## 2020-02-08 ENCOUNTER — Other Ambulatory Visit (HOSPITAL_COMMUNITY)
Admission: RE | Admit: 2020-02-08 | Discharge: 2020-02-08 | Disposition: A | Discharge status: Home / Self Care | Payer: Medicare Other | Source: Ambulatory Visit | Attending: Neurosurgery | Admitting: Neurosurgery

## 2020-02-08 ENCOUNTER — Encounter (HOSPITAL_COMMUNITY): Payer: Self-pay

## 2020-02-08 DIAGNOSIS — Z20822 Contact with and (suspected) exposure to covid-19: Secondary | ICD-10-CM | POA: Insufficient documentation

## 2020-02-08 DIAGNOSIS — Z01812 Encounter for preprocedural laboratory examination: Secondary | ICD-10-CM | POA: Insufficient documentation

## 2020-02-08 DIAGNOSIS — Z01818 Encounter for other preprocedural examination: Secondary | ICD-10-CM | POA: Insufficient documentation

## 2020-02-08 LAB — TYPE AND SCREEN
ABO/RH(D): O POS
Antibody Screen: NEGATIVE

## 2020-02-08 LAB — CBC
HCT: 47.1 % (ref 39.0–52.0)
Hemoglobin: 15.7 g/dL (ref 13.0–17.0)
MCH: 31.6 pg (ref 26.0–34.0)
MCHC: 33.3 g/dL (ref 30.0–36.0)
MCV: 94.8 fL (ref 80.0–100.0)
Platelets: 204 10*3/uL (ref 150–400)
RBC: 4.97 MIL/uL (ref 4.22–5.81)
RDW: 12.2 % (ref 11.5–15.5)
WBC: 6.8 10*3/uL (ref 4.0–10.5)
nRBC: 0 % (ref 0.0–0.2)

## 2020-02-08 LAB — BASIC METABOLIC PANEL
Anion gap: 10 (ref 5–15)
BUN: 20 mg/dL (ref 8–23)
CO2: 27 mmol/L (ref 22–32)
Calcium: 9.2 mg/dL (ref 8.9–10.3)
Chloride: 99 mmol/L (ref 98–111)
Creatinine, Ser: 1.32 mg/dL — ABNORMAL HIGH (ref 0.61–1.24)
GFR, Estimated: 57 mL/min — ABNORMAL LOW (ref >60)
Glucose, Bld: 247 mg/dL — ABNORMAL HIGH (ref 70–99)
Potassium: 4.7 mmol/L (ref 3.5–5.1)
Sodium: 136 mmol/L (ref 135–145)

## 2020-02-08 LAB — SARS CORONAVIRUS 2 (TAT 6-24 HRS): SARS Coronavirus 2: NEGATIVE

## 2020-02-08 LAB — SURGICAL PCR SCREEN
MRSA, PCR: NEGATIVE
Staphylococcus aureus: NEGATIVE

## 2020-02-08 LAB — GLUCOSE, CAPILLARY: Glucose-Capillary: 255 mg/dL — ABNORMAL HIGH (ref 70–99)

## 2020-02-08 NOTE — Progress Notes (Addendum)
PCP - Dr. Frazier Richards Cardiologist - Denies  Chest x-ray - 2019 at [facility] EKG - 10/23/19 Stress Test - 1988 ECHO - Denies Cardiac Cath - Denies  Sleep Study - Denies No OSA diagnosis  DM - Yes Type II States last A1C 7.2 Sept 2021 Fasting Blood Sugar - CBG fasting at home 89 today and typically averages 110 CBG at PAT appt 255 - patient ate breakfast on the way over to appt.  Checks Blood Sugar daily   COVID TEST- 02/08/20  Anesthesia review: No  Patient denies shortness of breath, fever, cough and chest pain at PAT appointment   All instructions explained to the patient, with a verbal understanding of the material. Patient agrees to go over the instructions while at home for a better understanding. Patient also instructed to self quarantine after being tested for COVID-19. The opportunity to ask questions was provided.

## 2020-02-08 NOTE — Pre-Procedure Instructions (Signed)
Calvin Roth  02/08/2020    Your procedure is scheduled on Wednesday, February 10, 2020 at 10:45 AM.   Report to [facility] Entrance "A" Admitting Office at 8:45 AM.   Call this number if you have problems the morning of surgery: [phone]   Questions prior to day of surgery, please call [phone] between 8 & 4 PM.   Remember:  Do not eat or drink after midnight Tuesday, 02/09/20.  Take these medicines the morning of surgery with A SIP OF WATER: Amlodipine (Norvasc), Atorvastatin (Lipitor), Carvedilol (Coreg), Omeprazole (Prilosec), eye drops  Do not use NSAIDS (Ibuprofen, Aleve, etc), Aspirin containing products, Multivitamins, Fish oil or Herbal medications prior to surgery.  Morning of surgery, take 1/2 of your regular dose of Levemir Insulin, you will take 28 units that morning.  HOW TO MANAGE YOUR DIABETES BEFORE AND AFTER SURGERY  Why is it important to control my blood sugar before and after surgery? . Improving blood sugar levels before and after surgery helps healing and can limit problems. . A way of improving blood sugar control is eating a healthy diet by: o  Eating less sugar and carbohydrates o  Increasing activity/exercise o  Talking with your doctor about reaching your blood sugar goals . High blood sugars (greater than 180 mg/dL) can raise your risk of infections and slow your recovery, so you will need to focus on controlling your diabetes during the weeks before surgery. . Make sure that the doctor who takes care of your diabetes knows about your planned surgery including the date and location.  How do I manage my blood sugar before surgery? . Check your blood sugar at least 4 times a day, starting 2 days before surgery, to make sure that the level is not too high or low. . Check your blood sugar the morning of your surgery when you wake up and every 2 hours until you get to the Short Stay unit. o If your blood sugar is less than 70 mg/dL, you will  need to treat for low blood sugar: - Do not take insulin. - Treat a low blood sugar (less than 70 mg/dL) with  cup of clear juice (cranberry or apple), 4 glucose tablets, OR glucose gel. - Recheck blood sugar in 15 minutes after treatment (to make sure it is greater than 70 mg/dL). If your blood sugar is not greater than 70 mg/dL on recheck, call [phone] for further instructions. . Report your blood sugar to the short stay nurse when you get to Short Stay.  . If you are admitted to the hospital after surgery: o Your blood sugar will be checked by the staff and you will probably be given insulin after surgery (instead of oral diabetes medicines) to make sure you have good blood sugar levels. o The goal for blood sugar control after surgery is 80-180 mg/dL.    Do not wear jewelry.  Do not wear lotions, powders, cologne or deodorant.  Men may shave face and neck.  Do not bring valuables to the hospital.  [facility] is not responsible for any belongings or valuables.  Contacts, dentures or bridgework may not be worn into surgery.  Leave your suitcase in the car.  After surgery it may be brought to your room.  For patients admitted to the hospital, discharge time will be determined by your treatment team.  [facility] - Preparing for Surgery  Before surgery, you can play an important role.  Because skin is not sterile,  your skin needs to be as free of germs as possible.  You can reduce the number of germs on you skin by washing with CHG (chlorahexidine gluconate) soap before surgery.  CHG is an antiseptic cleaner which kills germs and bonds with the skin to continue killing germs even after washing.  Oral Hygiene is also important in reducing the risk of infection.  Remember to brush your teeth with your regular toothpaste the morning of surgery.  Please DO NOT use if you have an allergy to CHG or antibacterial soaps.  If your skin becomes reddened/irritated stop using the CHG and inform  your nurse when you arrive at Short Stay.  Do not shave (including legs and underarms) for at least 48 hours prior to the first CHG shower.  You may shave your face.  Please follow these instructions carefully:   1.  Shower with CHG Soap the night before surgery and the morning of Surgery.  2.  If you choose to wash your hair, wash your hair first as usual with your normal shampoo.  3.  After you shampoo, rinse your hair and body thoroughly to remove the shampoo. 4.  Use CHG as you would any other liquid soap.  You can apply chg directly to the skin and wash gently with a      scrungie or washcloth.           5.  Apply the CHG Soap to your body ONLY FROM THE NECK DOWN.   Do not use on open wounds or open sores. Avoid contact with your eyes, ears, mouth and genitals (private parts).  Wash genitals (private parts) with your normal soap.  6.  Wash thoroughly, paying special attention to the area where your surgery will be performed.  7.  Thoroughly rinse your body with warm water from the neck down.  8.  DO NOT shower/wash with your normal soap after using and rinsing off the CHG Soap.  9.  Pat yourself dry with a clean towel.            10.  Wear clean pajamas.            11.  Place clean sheets on your bed the night of your first shower and do not sleep with pets.  Day of Surgery  Shower as above. Do not apply any lotions/deodorants the morning of surgery.   Please wear clean clothes to the hospital. Remember to brush your teeth with toothpaste.  Please read over the fact sheets that you were given.

## 2020-02-09 NOTE — Anesthesia Preprocedure Evaluation (Addendum)
Anesthesia Evaluation  Patient identified by MRN, date of birth, ID band Patient awake    Reviewed: Allergy & Precautions, NPO status , Patient's Chart, lab work & pertinent test results, reviewed documented beta blocker date and time   History of Anesthesia Complications Negative for: history of anesthetic complications  Airway Mallampati: III  TM Distance: >3 FB Neck ROM: Full    Dental  (+) Dental Advisory Given   Pulmonary neg pulmonary ROS,  Covid-19 Nucleic Acid Test Results Lab Results      Component                Value               Date                      SARSCOV2NAA              NEGATIVE            02/08/2020                [place]              NEGATIVE            10/22/2019              breath sounds clear to auscultation       Cardiovascular hypertension, Pt. on medications and Pt. on home beta blockers (-) angina(-) Past MI and (-) CHF  Rhythm:Regular     Neuro/Psych negative neurological ROS  negative psych ROS   GI/Hepatic Neg liver ROS, GERD  Medicated and Controlled,  Endo/Other  diabetes, Oral Hypoglycemic Agents  Renal/GU Lab Results      Component                Value               Date                      CREATININE               1.32 (H)            02/08/2020                Musculoskeletal  (+) Arthritis ,   Abdominal (+) + obese,   Peds  Hematology negative hematology ROS (+)   Anesthesia Other Findings SPINAL STENOSIS, LUMBAR REGION WITH NEUROGENIC CLAUDICATION  Reproductive/Obstetrics                           Anesthesia Physical Anesthesia Plan  ASA: III  Anesthesia Plan: General   Post-op Pain Management:    Induction: Intravenous  PONV Risk Score and Plan: 3 and Ondansetron, Dexamethasone, Midazolam and Treatment may vary due to age or medical condition  Airway Management Planned: Oral ETT  Additional Equipment:   Intra-op Plan:    Post-operative Plan: Extubation in OR  Informed Consent:   Plan Discussed with:   Anesthesia Plan Comments:         Anesthesia Quick Evaluation

## 2020-02-10 ENCOUNTER — Inpatient Hospital Stay (HOSPITAL_COMMUNITY)
Admission: RE | Admit: 2020-02-10 | Discharge: 2020-02-12 | DRG: 455 | Disposition: A | Discharge status: Home / Self Care | Payer: Medicare Other | Attending: Neurosurgery | Admitting: Neurosurgery

## 2020-02-10 ENCOUNTER — Inpatient Hospital Stay (HOSPITAL_COMMUNITY): Payer: Medicare Other | Admitting: Certified Registered"

## 2020-02-10 ENCOUNTER — Encounter (HOSPITAL_COMMUNITY)
Admission: RE | Disposition: A | Discharge status: Home / Self Care | Payer: Self-pay | Source: Home / Self Care | Attending: Neurosurgery

## 2020-02-10 ENCOUNTER — Encounter (HOSPITAL_COMMUNITY): Payer: Self-pay | Admitting: Neurosurgery

## 2020-02-10 ENCOUNTER — Inpatient Hospital Stay (HOSPITAL_COMMUNITY): Payer: Medicare Other

## 2020-02-10 DIAGNOSIS — R338 Other retention of urine: Secondary | ICD-10-CM | POA: Diagnosis present

## 2020-02-10 DIAGNOSIS — M48062 Spinal stenosis, lumbar region with neurogenic claudication: Secondary | ICD-10-CM | POA: Diagnosis present

## 2020-02-10 DIAGNOSIS — Z7984 Long term (current) use of oral hypoglycemic drugs: Secondary | ICD-10-CM

## 2020-02-10 DIAGNOSIS — M4726 Other spondylosis with radiculopathy, lumbar region: Secondary | ICD-10-CM | POA: Diagnosis present

## 2020-02-10 DIAGNOSIS — Z981 Arthrodesis status: Secondary | ICD-10-CM

## 2020-02-10 DIAGNOSIS — I10 Essential (primary) hypertension: Secondary | ICD-10-CM | POA: Diagnosis present

## 2020-02-10 DIAGNOSIS — M4316 Spondylolisthesis, lumbar region: Secondary | ICD-10-CM | POA: Diagnosis present

## 2020-02-10 DIAGNOSIS — Z20822 Contact with and (suspected) exposure to covid-19: Secondary | ICD-10-CM | POA: Diagnosis present

## 2020-02-10 DIAGNOSIS — E119 Type 2 diabetes mellitus without complications: Secondary | ICD-10-CM | POA: Diagnosis present

## 2020-02-10 DIAGNOSIS — Z8719 Personal history of other diseases of the digestive system: Secondary | ICD-10-CM

## 2020-02-10 DIAGNOSIS — Z79899 Other long term (current) drug therapy: Secondary | ICD-10-CM | POA: Diagnosis not present

## 2020-02-10 DIAGNOSIS — M5116 Intervertebral disc disorders with radiculopathy, lumbar region: Secondary | ICD-10-CM | POA: Diagnosis present

## 2020-02-10 DIAGNOSIS — M4800 Spinal stenosis, site unspecified: Secondary | ICD-10-CM | POA: Diagnosis present

## 2020-02-10 DIAGNOSIS — Z419 Encounter for procedure for purposes other than remedying health state, unspecified: Secondary | ICD-10-CM

## 2020-02-10 HISTORY — PX: LUMBAR FUSION: SHX111

## 2020-02-10 LAB — GLUCOSE, CAPILLARY
Glucose-Capillary: 115 mg/dL — ABNORMAL HIGH (ref 70–99)
Glucose-Capillary: 54 mg/dL — ABNORMAL LOW (ref 70–99)
Glucose-Capillary: 122 mg/dL — ABNORMAL HIGH (ref 70–99)
Glucose-Capillary: 246 mg/dL — ABNORMAL HIGH (ref 70–99)

## 2020-02-10 SURGERY — POSTERIOR LUMBAR FUSION 1 LEVEL
Anesthesia: General

## 2020-02-10 MED ORDER — OXYCODONE HCL 5 MG PO TABS
ORAL_TABLET | ORAL | Status: AC
  Filled 2020-02-10: qty 1

## 2020-02-10 MED ORDER — ORAL CARE MOUTH RINSE: 15.0000 mL | Freq: Once | OROMUCOSAL | Status: AC

## 2020-02-10 MED ORDER — CYCLOBENZAPRINE HCL 10 MG PO TABS
10.0000 mg | ORAL_TABLET | Freq: Three times a day (TID) | ORAL | Status: DC | PRN
  Administered 2020-02-10 – 2020-02-12 (×5): 10 mg via ORAL
  Filled 2020-02-10 (×4): qty 1

## 2020-02-10 MED ORDER — MIDAZOLAM HCL 2 MG/2ML IJ SOLN
INTRAMUSCULAR | Status: AC
  Filled 2020-02-10: qty 2

## 2020-02-10 MED ORDER — FENTANYL CITRATE (PF) 100 MCG/2ML IJ SOLN
25.0000 ug | INTRAMUSCULAR | Status: DC | PRN
  Administered 2020-02-10: 25 ug via INTRAVENOUS
  Administered 2020-02-10: 50 ug via INTRAVENOUS

## 2020-02-10 MED ORDER — BACITRACIN ZINC 500 UNIT/GM EX OINT
TOPICAL_OINTMENT | CUTANEOUS | Status: DC | PRN
  Administered 2020-02-10: 1 via TOPICAL

## 2020-02-10 MED ORDER — CARVEDILOL 6.25 MG PO TABS
6.2500 mg | ORAL_TABLET | Freq: Two times a day (BID) | ORAL | Status: DC
  Administered 2020-02-10 – 2020-02-12 (×4): 6.25 mg via ORAL
  Filled 2020-02-10 (×4): qty 1

## 2020-02-10 MED ORDER — CEFAZOLIN SODIUM-DEXTROSE 2-4 GM/100ML-% IV SOLN
INTRAVENOUS | Status: AC
  Filled 2020-02-10: qty 100

## 2020-02-10 MED ORDER — ALBUMIN HUMAN 5 % IV SOLN: INTRAVENOUS | Status: DC | PRN

## 2020-02-10 MED ORDER — ACETAMINOPHEN 650 MG RE SUPP: 650.0000 mg | RECTAL | Status: DC | PRN

## 2020-02-10 MED ORDER — LOSARTAN POTASSIUM 50 MG PO TABS
100.0000 mg | ORAL_TABLET | Freq: Every day | ORAL | Status: DC
  Administered 2020-02-11 – 2020-02-12 (×2): 100 mg via ORAL
  Filled 2020-02-10 (×2): qty 2

## 2020-02-10 MED ORDER — ONDANSETRON HCL 4 MG/2ML IJ SOLN
INTRAMUSCULAR | Status: AC
  Filled 2020-02-10: qty 2

## 2020-02-10 MED ORDER — CHLORHEXIDINE GLUCONATE CLOTH 2 % EX PADS: 6.0000 | MEDICATED_PAD | Freq: Once | CUTANEOUS | Status: DC

## 2020-02-10 MED ORDER — HYDROCHLOROTHIAZIDE 12.5 MG PO CAPS
12.5000 mg | ORAL_CAPSULE | Freq: Every day | ORAL | Status: DC
  Administered 2020-02-11 – 2020-02-12 (×2): 12.5 mg via ORAL
  Filled 2020-02-10 (×2): qty 1

## 2020-02-10 MED ORDER — ACETAMINOPHEN 160 MG/5ML PO SOLN: 1000.0000 mg | Freq: Once | ORAL | Status: DC | PRN

## 2020-02-10 MED ORDER — LACTATED RINGERS IV SOLN: INTRAVENOUS | Status: DC

## 2020-02-10 MED ORDER — PROPOFOL 10 MG/ML IV BOLUS
INTRAVENOUS | Status: AC
  Filled 2020-02-10: qty 20

## 2020-02-10 MED ORDER — PANTOPRAZOLE SODIUM 40 MG PO TBEC
40.0000 mg | DELAYED_RELEASE_TABLET | Freq: Every day | ORAL | Status: DC
  Administered 2020-02-11 – 2020-02-12 (×2): 40 mg via ORAL
  Filled 2020-02-10 (×2): qty 1

## 2020-02-10 MED ORDER — PROPOFOL 1000 MG/100ML IV EMUL
INTRAVENOUS | Status: AC
  Filled 2020-02-10: qty 100

## 2020-02-10 MED ORDER — FENTANYL CITRATE (PF) 250 MCG/5ML IJ SOLN
INTRAMUSCULAR | Status: AC
  Filled 2020-02-10: qty 5

## 2020-02-10 MED ORDER — CEFAZOLIN SODIUM-DEXTROSE 2-4 GM/100ML-% IV SOLN
2.0000 g | INTRAVENOUS | Status: AC
  Administered 2020-02-10: 2 g via INTRAVENOUS

## 2020-02-10 MED ORDER — LIDOCAINE 2% (20 MG/ML) 5 ML SYRINGE
INTRAMUSCULAR | Status: AC
  Filled 2020-02-10: qty 5

## 2020-02-10 MED ORDER — SUGAMMADEX SODIUM 200 MG/2ML IV SOLN
INTRAVENOUS | Status: DC | PRN
  Administered 2020-02-10: 200 mg via INTRAVENOUS

## 2020-02-10 MED ORDER — PHENOL 1.4 % MT LIQD: 1.0000 | OROMUCOSAL | Status: DC | PRN

## 2020-02-10 MED ORDER — ONDANSETRON HCL 4 MG/2ML IJ SOLN: 4.0000 mg | Freq: Four times a day (QID) | INTRAMUSCULAR | Status: DC | PRN

## 2020-02-10 MED ORDER — BUPIVACAINE-EPINEPHRINE 0.5% -1:200000 IJ SOLN
INTRAMUSCULAR | Status: AC
  Filled 2020-02-10: qty 1

## 2020-02-10 MED ORDER — SODIUM CHLORIDE 0.9% FLUSH: 3.0000 mL | INTRAVENOUS | Status: DC | PRN

## 2020-02-10 MED ORDER — VASOPRESSIN 20 UNIT/ML IV SOLN
INTRAVENOUS | Status: DC | PRN
  Administered 2020-02-10: 1 [IU] via INTRAVENOUS

## 2020-02-10 MED ORDER — OXYCODONE HCL 5 MG PO TABS: 5.0000 mg | ORAL_TABLET | ORAL | Status: DC | PRN

## 2020-02-10 MED ORDER — ACETAMINOPHEN 500 MG PO TABS: 1000.0000 mg | ORAL_TABLET | Freq: Once | ORAL | Status: AC

## 2020-02-10 MED ORDER — DEXAMETHASONE SODIUM PHOSPHATE 10 MG/ML IJ SOLN
INTRAMUSCULAR | Status: AC
  Filled 2020-02-10: qty 1

## 2020-02-10 MED ORDER — DOCUSATE SODIUM 100 MG PO CAPS
100.0000 mg | ORAL_CAPSULE | Freq: Two times a day (BID) | ORAL | Status: DC
  Administered 2020-02-11 – 2020-02-12 (×3): 100 mg via ORAL
  Filled 2020-02-10 (×4): qty 1

## 2020-02-10 MED ORDER — ATORVASTATIN CALCIUM 10 MG PO TABS
10.0000 mg | ORAL_TABLET | Freq: Every day | ORAL | Status: DC
  Administered 2020-02-11 – 2020-02-12 (×2): 10 mg via ORAL
  Filled 2020-02-10 (×2): qty 1

## 2020-02-10 MED ORDER — ROCURONIUM BROMIDE 10 MG/ML (PF) SYRINGE
PREFILLED_SYRINGE | INTRAVENOUS | Status: DC | PRN
  Administered 2020-02-10: 80 mg via INTRAVENOUS
  Administered 2020-02-10: 30 mg via INTRAVENOUS
  Administered 2020-02-10: 40 mg via INTRAVENOUS
  Administered 2020-02-10: 30 mg via INTRAVENOUS
  Administered 2020-02-10: 20 mg via INTRAVENOUS

## 2020-02-10 MED ORDER — FENTANYL CITRATE (PF) 100 MCG/2ML IJ SOLN
INTRAMUSCULAR | Status: DC | PRN
  Administered 2020-02-10 (×2): 50 ug via INTRAVENOUS
  Administered 2020-02-10: 100 ug via INTRAVENOUS

## 2020-02-10 MED ORDER — ACETAMINOPHEN 500 MG PO TABS
1000.0000 mg | ORAL_TABLET | Freq: Four times a day (QID) | ORAL | Status: AC
  Administered 2020-02-10 – 2020-02-11 (×3): 1000 mg via ORAL
  Filled 2020-02-10 (×3): qty 2

## 2020-02-10 MED ORDER — ACETAMINOPHEN 325 MG PO TABS
650.0000 mg | ORAL_TABLET | ORAL | Status: DC | PRN
  Administered 2020-02-11 – 2020-02-12 (×3): 650 mg via ORAL
  Filled 2020-02-10 (×3): qty 2

## 2020-02-10 MED ORDER — CYCLOBENZAPRINE HCL 10 MG PO TABS
ORAL_TABLET | ORAL | Status: AC
  Filled 2020-02-10: qty 1

## 2020-02-10 MED ORDER — EPHEDRINE SULFATE 50 MG/ML IJ SOLN
INTRAMUSCULAR | Status: DC | PRN
  Administered 2020-02-10: 15 mg via INTRAVENOUS
  Administered 2020-02-10: 10 mg via INTRAVENOUS

## 2020-02-10 MED ORDER — ACETAMINOPHEN 500 MG PO TABS
ORAL_TABLET | ORAL | Status: AC
  Administered 2020-02-10: 1000 mg via ORAL
  Filled 2020-02-10: qty 1

## 2020-02-10 MED ORDER — GABAPENTIN 400 MG PO CAPS
1200.0000 mg | ORAL_CAPSULE | Freq: Every day | ORAL | Status: DC
  Administered 2020-02-10 – 2020-02-11 (×2): 1200 mg via ORAL
  Filled 2020-02-10 (×3): qty 3

## 2020-02-10 MED ORDER — BACITRACIN ZINC 500 UNIT/GM EX OINT
TOPICAL_OINTMENT | CUTANEOUS | Status: AC
  Filled 2020-02-10: qty 28.35

## 2020-02-10 MED ORDER — DORZOLAMIDE HCL-TIMOLOL MAL 2-0.5 % OP SOLN
1.0000 [drp] | Freq: Two times a day (BID) | OPHTHALMIC | Status: DC
  Filled 2020-02-10: qty 10

## 2020-02-10 MED ORDER — LIDOCAINE 2% (20 MG/ML) 5 ML SYRINGE
INTRAMUSCULAR | Status: DC | PRN
  Administered 2020-02-10: 80 mg via INTRAVENOUS

## 2020-02-10 MED ORDER — DEXAMETHASONE SODIUM PHOSPHATE 10 MG/ML IJ SOLN
INTRAMUSCULAR | Status: DC | PRN
  Administered 2020-02-10: 10 mg via INTRAVENOUS

## 2020-02-10 MED ORDER — CHLORHEXIDINE GLUCONATE 0.12 % MT SOLN: 15.0000 mL | Freq: Once | OROMUCOSAL | Status: AC

## 2020-02-10 MED ORDER — LACTATED RINGERS IV SOLN: INTRAVENOUS | Status: DC | PRN

## 2020-02-10 MED ORDER — OXYCODONE HCL 5 MG PO TABS
10.0000 mg | ORAL_TABLET | ORAL | Status: DC | PRN
  Administered 2020-02-10 – 2020-02-12 (×9): 10 mg via ORAL
  Filled 2020-02-10 (×9): qty 2

## 2020-02-10 MED ORDER — ACETAMINOPHEN 500 MG PO TABS: 1000.0000 mg | ORAL_TABLET | Freq: Once | ORAL | Status: DC | PRN

## 2020-02-10 MED ORDER — SODIUM CHLORIDE 0.9 % IV SOLN: 250.0000 mL | INTRAVENOUS | Status: DC

## 2020-02-10 MED ORDER — METFORMIN HCL ER 500 MG PO TB24
1000.0000 mg | ORAL_TABLET | Freq: Two times a day (BID) | ORAL | Status: DC
  Administered 2020-02-10 – 2020-02-12 (×4): 1000 mg via ORAL
  Filled 2020-02-10 (×4): qty 2

## 2020-02-10 MED ORDER — DEXTROSE 50 % IV SOLN: 25.0000 mL | Freq: Once | INTRAVENOUS | Status: DC

## 2020-02-10 MED ORDER — VASOPRESSIN 20 UNIT/ML IV SOLN
INTRAVENOUS | Status: AC
  Filled 2020-02-10: qty 1

## 2020-02-10 MED ORDER — CHLORHEXIDINE GLUCONATE 0.12 % MT SOLN
OROMUCOSAL | Status: AC
  Administered 2020-02-10: 15 mL via OROMUCOSAL
  Filled 2020-02-10: qty 15

## 2020-02-10 MED ORDER — OXYCODONE HCL 5 MG PO TABS
5.0000 mg | ORAL_TABLET | Freq: Once | ORAL | Status: AC | PRN
  Administered 2020-02-10: 5 mg via ORAL

## 2020-02-10 MED ORDER — FENTANYL CITRATE (PF) 100 MCG/2ML IJ SOLN
INTRAMUSCULAR | Status: AC
  Filled 2020-02-10: qty 2

## 2020-02-10 MED ORDER — SODIUM CHLORIDE 0.9% FLUSH
3.0000 mL | Freq: Two times a day (BID) | INTRAVENOUS | Status: DC
  Administered 2020-02-10: 3 mL via INTRAVENOUS

## 2020-02-10 MED ORDER — HEMOSTATIC AGENTS (NO CHARGE) OPTIME
TOPICAL | Status: DC | PRN
  Administered 2020-02-10: 1 via TOPICAL

## 2020-02-10 MED ORDER — BISACODYL 10 MG RE SUPP: 10.0000 mg | Freq: Every day | RECTAL | Status: DC | PRN

## 2020-02-10 MED ORDER — ACETAMINOPHEN 10 MG/ML IV SOLN: 1000.0000 mg | Freq: Once | INTRAVENOUS | Status: DC | PRN

## 2020-02-10 MED ORDER — MORPHINE SULFATE (PF) 4 MG/ML IV SOLN: 4.0000 mg | INTRAVENOUS | Status: DC | PRN

## 2020-02-10 MED ORDER — INSULIN ASPART 100 UNIT/ML ~~LOC~~ SOLN
0.0000 [IU] | Freq: Three times a day (TID) | SUBCUTANEOUS | Status: DC
  Administered 2020-02-11 (×2): 11 [IU] via SUBCUTANEOUS
  Administered 2020-02-11: 3 [IU] via SUBCUTANEOUS
  Administered 2020-02-12: 7 [IU] via SUBCUTANEOUS

## 2020-02-10 MED ORDER — 0.9 % SODIUM CHLORIDE (POUR BTL) OPTIME
TOPICAL | Status: DC | PRN
  Administered 2020-02-10: 1000 mL

## 2020-02-10 MED ORDER — EPHEDRINE 5 MG/ML INJ
INTRAVENOUS | Status: AC
  Filled 2020-02-10: qty 10

## 2020-02-10 MED ORDER — ROCURONIUM BROMIDE 10 MG/ML (PF) SYRINGE
PREFILLED_SYRINGE | INTRAVENOUS | Status: AC
  Filled 2020-02-10: qty 10

## 2020-02-10 MED ORDER — BUPIVACAINE-EPINEPHRINE (PF) 0.5% -1:200000 IJ SOLN
INTRAMUSCULAR | Status: DC | PRN
  Administered 2020-02-10: 10 mL via PERINEURAL

## 2020-02-10 MED ORDER — PHENYLEPHRINE HCL-NACL 10-0.9 MG/250ML-% IV SOLN
INTRAVENOUS | Status: DC | PRN
  Administered 2020-02-10: 50 ug/min via INTRAVENOUS

## 2020-02-10 MED ORDER — MENTHOL 3 MG MT LOZG: 1.0000 | LOZENGE | OROMUCOSAL | Status: DC | PRN

## 2020-02-10 MED ORDER — ONDANSETRON HCL 4 MG/2ML IJ SOLN
INTRAMUSCULAR | Status: DC | PRN
  Administered 2020-02-10: 4 mg via INTRAVENOUS

## 2020-02-10 MED ORDER — ONDANSETRON HCL 4 MG PO TABS: 4.0000 mg | ORAL_TABLET | Freq: Four times a day (QID) | ORAL | Status: DC | PRN

## 2020-02-10 MED ORDER — LOSARTAN POTASSIUM-HCTZ 100-12.5 MG PO TABS: 1.0000 | ORAL_TABLET | Freq: Every day | ORAL | Status: DC

## 2020-02-10 MED ORDER — DEXTROSE 50 % IV SOLN
INTRAVENOUS | Status: AC
  Administered 2020-02-10: 50 mL
  Filled 2020-02-10: qty 50

## 2020-02-10 MED ORDER — TAMSULOSIN HCL 0.4 MG PO CAPS
0.4000 mg | ORAL_CAPSULE | Freq: Every day | ORAL | Status: DC
  Administered 2020-02-10 – 2020-02-12 (×3): 0.4 mg via ORAL
  Filled 2020-02-10 (×3): qty 1

## 2020-02-10 MED ORDER — AMLODIPINE BESYLATE 10 MG PO TABS
10.0000 mg | ORAL_TABLET | Freq: Every day | ORAL | Status: DC
  Administered 2020-02-11 – 2020-02-12 (×2): 10 mg via ORAL
  Filled 2020-02-10: qty 2
  Filled 2020-02-10 (×2): qty 1
  Filled 2020-02-10: qty 2

## 2020-02-10 MED ORDER — PROPOFOL 10 MG/ML IV BOLUS
INTRAVENOUS | Status: DC | PRN
  Administered 2020-02-10: 200 mg via INTRAVENOUS

## 2020-02-10 MED ORDER — CEFAZOLIN SODIUM-DEXTROSE 2-4 GM/100ML-% IV SOLN
2.0000 g | Freq: Three times a day (TID) | INTRAVENOUS | Status: AC
  Administered 2020-02-10 – 2020-02-11 (×2): 2 g via INTRAVENOUS
  Filled 2020-02-10 (×2): qty 100

## 2020-02-10 MED ORDER — OXYCODONE HCL 5 MG/5ML PO SOLN: 5.0000 mg | Freq: Once | ORAL | Status: AC | PRN

## 2020-02-10 MED ORDER — PHENYLEPHRINE HCL (PRESSORS) 10 MG/ML IV SOLN
INTRAVENOUS | Status: DC | PRN
  Administered 2020-02-10: 80 ug via INTRAVENOUS
  Administered 2020-02-10: 120 ug via INTRAVENOUS

## 2020-02-10 MED ORDER — PHENYLEPHRINE 40 MCG/ML (10ML) SYRINGE FOR IV PUSH (FOR BLOOD PRESSURE SUPPORT)
PREFILLED_SYRINGE | INTRAVENOUS | Status: AC
  Filled 2020-02-10: qty 10

## 2020-02-10 MED ORDER — BUPIVACAINE LIPOSOME 1.3 % IJ SUSP
20.0000 mL | INTRAMUSCULAR | Status: AC
  Administered 2020-02-10: 20 mL
  Filled 2020-02-10: qty 20

## 2020-02-10 SURGICAL SUPPLY — 68 items
BENZOIN TINCTURE PRP APPL 2/3 (GAUZE/BANDAGES/DRESSINGS) ×2 IMPLANT
BLADE CLIPPER SURG (BLADE) ×2 IMPLANT
BUR MATCHSTICK NEURO 3.0 LAGG (BURR) ×2 IMPLANT
BUR PRECISION FLUTE 6.0 (BURR) ×2 IMPLANT
CAGE ALTERA 10X31X9-13 15D (Cage) ×2 IMPLANT
CANISTER SUCT 3000ML PPV (MISCELLANEOUS) ×2 IMPLANT
CAP REVERE LOCKING (Cap) ×16 IMPLANT
CARTRIDGE OIL MAESTRO DRILL (MISCELLANEOUS) ×1 IMPLANT
CNTNR URN SCR LID CUP LEK RST (MISCELLANEOUS) ×1 IMPLANT
CONN CROSSLINK REV 6.35 48-60 (Connector) ×2 IMPLANT
CONNECTOR CRSLNK REV6.35 48-60 (Connector) ×1 IMPLANT
CONT SPEC 4OZ STRL OR WHT (MISCELLANEOUS) ×1
COVER BACK TABLE 60X90IN (DRAPES) ×2 IMPLANT
COVER WAND RF STERILE (DRAPES) IMPLANT
DECANTER SPIKE VIAL GLASS SM (MISCELLANEOUS) ×2 IMPLANT
DIFFUSER DRILL AIR PNEUMATIC (MISCELLANEOUS) ×2 IMPLANT
DRAPE C-ARM 42X72 X-RAY (DRAPES) ×4 IMPLANT
DRAPE HALF SHEET 40X57 (DRAPES) ×2 IMPLANT
DRAPE LAPAROTOMY 100X72X124 (DRAPES) ×2 IMPLANT
DRAPE SURG 17X23 STRL (DRAPES) ×8 IMPLANT
DRSG OPSITE POSTOP 4X6 (GAUZE/BANDAGES/DRESSINGS) IMPLANT
DRSG OPSITE POSTOP 4X8 (GAUZE/BANDAGES/DRESSINGS) ×2 IMPLANT
ELECT BLADE 4.0 EZ CLEAN MEGAD (MISCELLANEOUS) ×2
ELECT REM PT RETURN 9FT ADLT (ELECTROSURGICAL) ×2
ELECTRODE BLDE 4.0 EZ CLN MEGD (MISCELLANEOUS) ×1 IMPLANT
ELECTRODE REM PT RTRN 9FT ADLT (ELECTROSURGICAL) ×1 IMPLANT
EVACUATOR 1/8 PVC DRAIN (DRAIN) IMPLANT
GAUZE 4X4 16PLY RFD (DISPOSABLE) IMPLANT
GLOVE BIO SURGEON STRL SZ 6.5 (GLOVE) ×8 IMPLANT
GLOVE BIO SURGEON STRL SZ8 (GLOVE) ×4 IMPLANT
GLOVE BIO SURGEON STRL SZ8.5 (GLOVE) ×4 IMPLANT
GLOVE BIOGEL PI IND STRL 6.5 (GLOVE) ×4 IMPLANT
GLOVE BIOGEL PI IND STRL 7.5 (GLOVE) ×1 IMPLANT
GLOVE BIOGEL PI INDICATOR 6.5 (GLOVE) ×4
GLOVE BIOGEL PI INDICATOR 7.5 (GLOVE) ×1
GLOVE EXAM NITRILE XL STR (GLOVE) IMPLANT
GLOVE SURG SS PI 7.0 STRL IVOR (GLOVE) ×4 IMPLANT
GOWN STRL REUS W/ TWL LRG LVL3 (GOWN DISPOSABLE) ×3 IMPLANT
GOWN STRL REUS W/ TWL XL LVL3 (GOWN DISPOSABLE) ×2 IMPLANT
GOWN STRL REUS W/TWL 2XL LVL3 (GOWN DISPOSABLE) IMPLANT
GOWN STRL REUS W/TWL LRG LVL3 (GOWN DISPOSABLE) ×3
GOWN STRL REUS W/TWL XL LVL3 (GOWN DISPOSABLE) ×2
HEMOSTAT POWDER KIT SURGIFOAM (HEMOSTASIS) IMPLANT
KIT BASIN OR (CUSTOM PROCEDURE TRAY) ×2 IMPLANT
KIT TURNOVER KIT B (KITS) ×2 IMPLANT
MILL MEDIUM DISP (BLADE) IMPLANT
NEEDLE HYPO 21X1.5 SAFETY (NEEDLE) ×2 IMPLANT
NEEDLE HYPO 22GX1.5 SAFETY (NEEDLE) ×2 IMPLANT
NS IRRIG 1000ML POUR BTL (IV SOLUTION) ×2 IMPLANT
OIL CARTRIDGE MAESTRO DRILL (MISCELLANEOUS) ×2
PACK LAMINECTOMY NEURO (CUSTOM PROCEDURE TRAY) ×2 IMPLANT
PAD ARMBOARD 7.5X6 YLW CONV (MISCELLANEOUS) ×6 IMPLANT
PATTIES SURGICAL .5 X1 (DISPOSABLE) IMPLANT
PUTTY DBM 10CC CALC GRAN (Putty) ×2 IMPLANT
ROD CREO CRVD 6.35X100 (Rod) ×4 IMPLANT
SCREW REVERE 6.35 6.5X55MM (Screw) ×4 IMPLANT
SPONGE LAP 4X18 RFD (DISPOSABLE) IMPLANT
SPONGE NEURO XRAY DETECT 1X3 (DISPOSABLE) IMPLANT
SPONGE SURGIFOAM ABS GEL 100 (HEMOSTASIS) IMPLANT
STRIP CLOSURE SKIN 1/2X4 (GAUZE/BANDAGES/DRESSINGS) ×2 IMPLANT
SUT VIC AB 1 CT1 18XBRD ANBCTR (SUTURE) ×1 IMPLANT
SUT VIC AB 1 CT1 8-18 (SUTURE) ×1
SUT VIC AB 2-0 CP2 18 (SUTURE) ×4 IMPLANT
SYR 20ML LL LF (SYRINGE) ×2 IMPLANT
TOWEL GREEN STERILE (TOWEL DISPOSABLE) ×2 IMPLANT
TOWEL GREEN STERILE FF (TOWEL DISPOSABLE) ×2 IMPLANT
TRAY FOLEY MTR SLVR 16FR STAT (SET/KITS/TRAYS/PACK) ×2 IMPLANT
WATER STERILE IRR 1000ML POUR (IV SOLUTION) ×2 IMPLANT

## 2020-02-10 NOTE — H&P (Signed)
Subjective: The patient is a 74 year old white male on whom I performed a L3-4 and L4-5 decompression, instrumentation and fusion.  He has done well until recently when he developed recurrent back and left great and right leg pain.  He has failed medical management.  He was worked up with a lumbar myelo CT which demonstrated L2-3 spinal stenosis.  I discussed the various treatment options with him.  He has decided to proceed with surgery.  Past Medical History:  Diagnosis Date  . Colon polyp 2018  . Descending aortic aneurysm ([place]) 2019  . Diabetes mellitus without complication ([place])   . Hypertension     Past Surgical History:  Procedure Laterality Date  . BACK SURGERY  2017  . CHOLECYSTECTOMY    . COLONOSCOPY WITH PROPOFOL N/A 07/17/2016   Procedure: COLONOSCOPY WITH PROPOFOL;  Surgeon: Lollie Sails, MD;  Location: [facility] ENDOSCOPY;  Service: Endoscopy;  Laterality: N/A;  . COLONOSCOPY WITH PROPOFOL N/A 02/04/2018   Procedure: COLONOSCOPY WITH PROPOFOL;  Surgeon: Lollie Sails, MD;  Location: [facility] ENDOSCOPY;  Service: Endoscopy;  Laterality: N/A;  . EYE SURGERY Left 2000   Strabismus correction  . EYE SURGERY Bilateral 2019 and 2020   cataracts  . FOREIGN BODY REMOVAL Left 10/23/2019   Procedure: REMOVAL FOREIGN BODY LEFT HEEL;  Surgeon: Samara Deist, DPM;  Location: [facility] ORS;  Service: Podiatry;  Laterality: Left;  . stomach stapled  1982   For weight loss  . TONSILLECTOMY Bilateral 1954  . TRANSANAL EXCISION OF RECTAL MASS N/A 09/18/2016   Procedure: TRANSANAL EXCISION OF RECTAL TUMOR;  Surgeon: Christene Lye, MD;  Location: [facility] ORS;  Service: General;  Laterality: N/A;    No Known Allergies  Social History   Tobacco Use  . Smoking status: Never Smoker  . Smokeless tobacco: Never Used  Substance Use Topics  . Alcohol use: Yes    Comment: 6 - 12 beers a week    Family History  Problem Relation Age of Onset  . Colon cancer Neg Hx   . Breast cancer Neg Hx     Prior to Admission medications   Medication Sig Start Date End Date Taking? Authorizing Provider  amLODipine (NORVASC) 10 MG tablet Take 10 mg by mouth daily. 02/02/20  Yes [provider]  atorvastatin (LIPITOR) 10 MG tablet Take 10 mg by mouth daily.  10/27/14 02/03/20 Yes [provider]  carvedilol (COREG) 6.25 MG tablet Take 6.25 mg by mouth 2 (two) times daily. 02/02/20  Yes [provider]  dorzolamide-timolol (COSOPT) 22.3-6.8 MG/ML ophthalmic solution Place 1 drop into the right eye 2 (two) times daily. 08/12/19  Yes [provider]  gabapentin (NEURONTIN) 400 MG capsule Take 1,200 mg by mouth at bedtime.   Yes [provider]  LEVEMIR FLEXTOUCH 100 UNIT/ML Pen Inject 56 Units into the skin daily.  03/19/15  Yes [provider]  losartan-hydrochlorothiazide (HYZAAR) 100-12.5 MG tablet Take 1 tablet by mouth daily.  10/27/14  Yes [provider]  metFORMIN (GLUCOPHAGE-XR) 500 MG 24 hr tablet Take 1,000 mg by mouth 2 (two) times daily.    Yes [provider]  omeprazole (PRILOSEC) 20 MG capsule Take 20 mg by mouth daily.   Yes [provider]     Review of Systems  Positive ROS: As above  All other systems have been reviewed and were otherwise negative with the exception of those mentioned in the HPI and as above.  Objective: Vital signs in last 24 hours: Temp:  [  97.7 F (36.5 C)] 97.7 F (36.5 C) (11/10 0959) Pulse Rate:  [75] 75 (11/10 0959) Resp:  [17] 17 (11/10 0959) BP: (121)/(81) 121/81 (11/10 0959) SpO2:  [98 %] 98 % (11/10 0959) Weight:  [113.6 kg] 113.6 kg (11/10 0959) Estimated body mass index is 34.93 kg/m as calculated from the following:   Height as of this encounter: 5\' 11"  (1.803 m).   Weight as of this encounter: 113.6 kg.   General Appearance: Alert Head: Normocephalic, without obvious abnormality, atraumatic Eyes: PERRL, conjunctiva/corneas clear, EOM's intact,    Ears: Normal   Throat: Normal  Neck: Supple, Back: His lumbar incision is well-healed. Lungs: Clear to auscultation bilaterally, respirations unlabored Heart: Regular rate and rhythm, no murmur, rub or gallop Abdomen: Soft, non-tender Extremities: Extremities normal, atraumatic, no cyanosis or edema Skin: unremarkable  NEUROLOGIC:   Mental status: alert and oriented,Motor Exam - grossly normal Sensory Exam - grossly normal Reflexes:  Coordination - grossly normal Gait - grossly normal Balance - grossly normal Cranial Nerves: I: smell Not tested  II: visual acuity  OS: Normal  OD: Normal   II: visual fields Full to confrontation  II: pupils Equal, round, reactive to light  III,VII: ptosis None  III,IV,VI: extraocular muscles  Full ROM  V: mastication Normal  V: facial light touch sensation  Normal  V,VII: corneal reflex  Present  VII: facial muscle function - upper  Normal  VII: facial muscle function - lower Normal  VIII: hearing Not tested  IX: soft palate elevation  Normal  IX,X: gag reflex Present  XI: trapezius strength  5/5  XI: sternocleidomastoid strength 5/5  XI: neck flexion strength  5/5  XII: tongue strength  Normal    Data Review Lab Results  Component Value Date   WBC 6.8 02/08/2020   HGB 15.7 02/08/2020   HCT 47.1 02/08/2020   MCV 94.8 02/08/2020   PLT 204 02/08/2020   Lab Results  Component Value Date   NA 136 02/08/2020   K 4.7 02/08/2020   CL 99 02/08/2020   CO2 27 02/08/2020   BUN 20 02/08/2020   CREATININE 1.32 (H) 02/08/2020   GLUCOSE 247 (H) 02/08/2020   Lab Results  Component Value Date   INR 0.99 10/31/2017    Assessment/Plan: 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  I reviewed his imaging studies with him and pointed out the abnormalities.  We have discussed the various treatment options including surgery.  I have described the surgical treatment option of  an exploration of fusion with L2-3 decompression, instrumentation and fusion.  I have shown him surgical models.  I have given him a surgical pamphlet.  We have discussed the risk, benefits, alternatives, expected postoperative course, and likelihood of achieving our goals with surgery.  I have answered all his questions.  He has decided proceed with surgery.   Ophelia Charter 02/10/2020 12:21 PM

## 2020-02-10 NOTE — Transfer of Care (Signed)
Immediate Anesthesia Transfer of Care Note  Patient: Calvin Roth  Procedure(s) Performed: PLIF,IP,PSI L2- L3;EXPL FUSION (N/A )  Patient Location: PACU  Anesthesia Type:General  Level of Consciousness: oriented and drowsy  Airway & Oxygen Therapy: Patient Spontanous Breathing and Patient connected to face mask oxygen  Post-op Assessment: Report given to RN and Patient moving all extremities X 4  Post vital signs: Reviewed and stable  Last Vitals:  Vitals Value Taken Time  BP 101/64 02/10/20 1624  Temp    Pulse 72 02/10/20 1626  Resp 11 02/10/20 1626  SpO2 98 % 02/10/20 1626  Vitals shown include unvalidated device data.  Last Pain:  Vitals:   02/10/20 1012  TempSrc:   PainSc: 0-No pain         Complications: No complications documented.

## 2020-02-10 NOTE — Op Note (Signed)
Brief history: The patient is a 74 year old white male whom I previously performed an L3-4 and L4-5 decompression, instrumentation and fusion.  He has done well until recently when he developed recurrent back and left great and right leg pain consistent with neurogenic claudication.  He was worked up with a lumbar myelo CT which demonstrated severe stenosis at L2-3.  I discussed the various treatment options.  He has decided to proceed with surgery.  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 Etc. requiring a wide decompression of the nerve roots.);  L2-3 transforaminal lumbar interbody fusion with local morselized autograft bone and Zimmer DBM; insertion of interbody prosthesis at L2-3 (globus peek expandable interbody prosthesis); posterior segmental instrumentation from L2 to L5 with globus titanium pedicle screws and rods; posterior lateral arthrodesis at L2-3 bilaterally with local morselized autograft bone and Zimmer DBM; exploration of lumbar fusion/removal of lumbar hardware.  Surgeon: Dr. Earle Gell  Asst.: Dr. Lorin Glass  Anesthesia: Gen. endotracheal  Estimated blood loss: 200 cc  Drains: None  Complications: None  Description of procedure: The patient was brought to the operating room by the anesthesia team. General endotracheal anesthesia was induced. The patient was turned to the prone position on the Wilson frame. The patient's lumbosacral region was then prepared with Betadine scrub and Betadine solution. Sterile drapes were  applied.  I then injected the area to be incised with Marcaine with epinephrine solution. I then used the scalpel to make a linear midline incision over the L2-3, L3-4 and L4-5 interspace. I then used electrocautery to perform a bilateral subperiosteal dissection exposing the spinous process and lamina of L2, L3 and expose the old hardware at L3-4 and L4-5. We then inserted the Verstrac retractor to provide exposure.  We explored the fusion by removing the caps from the old screws, removing the cross connector and then removing the rods.  I inspected arthrodesis at L3-4 and L4-5.  It appeared solid.  I began the decompression by using the high speed drill to perform laminotomies at L2-3 bilaterally. We then used the Kerrison punches to widen the laminotomy and removed the ligamentum flavum at L2-3 bilaterally. We used the Kerrison punches to remove the medial facets at L2-3 bilaterally, remove the left L2-3 facet. We performed wide foraminotomies about the bilateral L2 and L3 nerve roots completing the decompression.  We now turned our attention to the posterior lumbar interbody fusion. I used a scalpel to incise the intervertebral disc at L2-3 bilaterally. I then performed a partial intervertebral discectomy at L2-3 bilaterally using the pituitary forceps. We prepared the vertebral endplates at O7-5 bilaterally for the fusion by removing the soft tissues with the curettes. We then used the trial spacers to pick the appropriate sized interbody prosthesis. We prefilled his prosthesis with a combination of local morselized autograft bone that we obtained during the decompression as well as Zimmer DBM. We inserted the prefilled prosthesis into the interspace at L2-3 from the left, we then turned and expanded the prosthesis. There was a good snug fit of the prosthesis in the interspace. We then filled and the remainder of the intervertebral disc space with local  morselized autograft bone and Zimmer DBM. This  completed the posterior lumbar interbody arthrodesis.  During the decompression and insertion of the prosthesis the assistant protected the thecal sac and nerve roots with the D'Errico retractor.  We now turned attention to the instrumentation. Under fluoroscopic guidance we cannulated the bilateral L2 pedicles with the bone probe. We then removed the bone probe. We then tapped the pedicle with a 6.5 millimeter tap. We then removed the tap. We probed inside the tapped pedicle with a ball probe to rule out cortical breaches. We then inserted a 7.5 x 55 millimeter pedicle screw into the L2 pedicles bilaterally under fluoroscopic guidance. We then palpated along the medial aspect of the pedicles to rule out cortical breaches. There were none. The nerve roots were not injured. We then connected the unilateral pedicle screws with a lordotic rod. We compressed the construct and secured the rod in place with the caps. We then tightened the caps appropriately. This completed the instrumentation from L2-L5 bilaterally.  We now turned our attention to the posterior lateral arthrodesis at L2-3 bilaterally. We used the high-speed drill to decorticate the remainder of the facets, pars, transverse process at L2-3 bilaterally. We then applied a combination of local morselized autograft bone and Zimmer DBM over these decorticated posterior lateral structures. This completed the posterior lateral arthrodesis.  We then obtained hemostasis using bipolar electrocautery. We irrigated the wound out with bacitracin solution. We inspected the thecal sac and nerve roots and noted they were well decompressed. We then removed the retractor.  We injected Exparel . We reapproximated patient's thoracolumbar fascia with interrupted #1 Vicryl suture. We reapproximated patient's subcutaneous tissue with interrupted 2-0 Vicryl suture. The reapproximated patient's skin with Steri-Strips and benzoin. The wound was then coated with bacitracin  ointment. A sterile dressing was applied. The drapes were removed. The patient was subsequently returned to the supine position where they were extubated by the anesthesia team. He was then transported to the post anesthesia care unit in stable condition. All sponge instrument and needle counts were reportedly correct at the end of this case.

## 2020-02-10 NOTE — Progress Notes (Signed)
Orthopedic Tech Progress Note Patient Details:  Calvin Roth 02-17-1946 [id number] Fitted patient with BRACE in PACU Patient ID: Calvin Roth, male   DOB: November 24, 1945, 74 y.o.   MRN: [id number]   Calvin Roth 02/10/2020, 6:31 PM

## 2020-02-10 NOTE — Progress Notes (Signed)
Subjective: The patient is alert and pleasant.  He is in no apparent distress.  Objective: Vital signs in last 24 hours: Temp:  [97.7 F (36.5 C)-98.4 F (36.9 C)] 98.4 F (36.9 C) (11/10 1624) Pulse Rate:  [72-75] 72 (11/10 1624) Resp:  [12-17] 12 (11/10 1624) BP: (101-121)/(64-81) 101/64 (11/10 1624) SpO2:  [97 %-98 %] 97 % (11/10 1624) Weight:  [113.6 kg] 113.6 kg (11/10 0959) Estimated body mass index is 34.93 kg/m as calculated from the following:   Height as of this encounter: 5\' 11"  (1.803 m).   Weight as of this encounter: 113.6 kg.   Intake/Output from previous day: No intake/output data recorded. Intake/Output this shift: Total I/O In: 2350 [I.V.:2100; IV Piggyback:250] Out: 2700 [Urine:2600; Blood:100]  Physical exam the patient is alert and oriented.  He is moving his lower extremities well.  Lab Results: Recent Labs    02/08/20 1047  WBC 6.8  HGB 15.7  HCT 47.1  PLT 204   BMET Recent Labs    02/08/20 1047  NA 136  K 4.7  CL 99  CO2 27  GLUCOSE 247*  BUN 20  CREATININE 1.32*  CALCIUM 9.2    Studies/Results: No results found.  Assessment/Plan: The patient is doing well.  I spoke with his wife.  We discussed that preoperatively, upon insertion of his Foley catheter, he drained 2-1/2 L.  She tells me he drinks a lot and urinates a lot.  He may need to see a urologist.  LOS: 0 days     Ophelia Charter 02/10/2020, 4:53 PM

## 2020-02-10 NOTE — Anesthesia Procedure Notes (Signed)
Procedure Name: Intubation Date/Time: 02/10/2020 12:43 PM Performed by: Barrington Ellison, CRNA Pre-anesthesia Checklist: Patient identified, Emergency Drugs available, Suction available and Patient being monitored Patient Re-evaluated:Patient Re-evaluated prior to induction Oxygen Delivery Method: Circle System Utilized Preoxygenation: Pre-oxygenation with 100% oxygen Induction Type: IV induction Ventilation: Mask ventilation without difficulty Laryngoscope Size: Mac and 4 Grade View: Grade I Tube type: Oral Tube size: 7.5 mm Number of attempts: 1 Airway Equipment and Method: Stylet and Oral airway Placement Confirmation: ETT inserted through vocal cords under direct vision,  positive ETCO2 and breath sounds checked- equal and bilateral Secured at: 23 cm Tube secured with: Tape Dental Injury: Teeth and Oropharynx as per pre-operative assessment

## 2020-02-11 ENCOUNTER — Other Ambulatory Visit: Payer: Self-pay

## 2020-02-11 LAB — CBC
HCT: 42.7 % (ref 39.0–52.0)
Hemoglobin: 14 g/dL (ref 13.0–17.0)
MCH: 31.7 pg (ref 26.0–34.0)
MCHC: 32.8 g/dL (ref 30.0–36.0)
MCV: 96.6 fL (ref 80.0–100.0)
Platelets: 184 10*3/uL (ref 150–400)
RBC: 4.42 MIL/uL (ref 4.22–5.81)
RDW: 12.4 % (ref 11.5–15.5)
WBC: 13.5 10*3/uL — ABNORMAL HIGH (ref 4.0–10.5)
nRBC: 0 % (ref 0.0–0.2)

## 2020-02-11 LAB — GLUCOSE, CAPILLARY
Glucose-Capillary: 294 mg/dL — ABNORMAL HIGH (ref 70–99)
Glucose-Capillary: 288 mg/dL — ABNORMAL HIGH (ref 70–99)
Glucose-Capillary: 123 mg/dL — ABNORMAL HIGH (ref 70–99)
Glucose-Capillary: 250 mg/dL — ABNORMAL HIGH (ref 70–99)

## 2020-02-11 LAB — HEMOGLOBIN A1C
Hgb A1c MFr Bld: 7.6 % — ABNORMAL HIGH (ref 4.8–5.6)
Mean Plasma Glucose: 171.42 mg/dL

## 2020-02-11 LAB — BASIC METABOLIC PANEL
Anion gap: 10 (ref 5–15)
BUN: 30 mg/dL — ABNORMAL HIGH (ref 8–23)
CO2: 25 mmol/L (ref 22–32)
Calcium: 8.7 mg/dL — ABNORMAL LOW (ref 8.9–10.3)
Chloride: 96 mmol/L — ABNORMAL LOW (ref 98–111)
Creatinine, Ser: 1.8 mg/dL — ABNORMAL HIGH (ref 0.61–1.24)
GFR, Estimated: 39 mL/min — ABNORMAL LOW (ref >60)
Glucose, Bld: 308 mg/dL — ABNORMAL HIGH (ref 70–99)
Potassium: 4.9 mmol/L (ref 3.5–5.1)
Sodium: 131 mmol/L — ABNORMAL LOW (ref 135–145)

## 2020-02-11 NOTE — Evaluation (Signed)
Physical Therapy Evaluation Patient Details Name: Calvin Roth MRN: [id number] DOB: 29-Dec-1945 Today's Date: 02/11/2020   History of Present Illness  Pt is a 74 y.o. male s/p lumbar fusion. PMH consists of DM and previous back sx.  Clinical Impression  PT eval complete. PTA pt independent with mobility. On eval, he required supervision transfers, supervision ambulation 300' with RW, and min guard assist ascend/descend 4 steps with L rail. PT to follow acutely. No follow up services indicated.    Follow Up Recommendations No PT follow up    Equipment Recommendations  None recommended by PT    Recommendations for Other Services       Precautions / Restrictions Precautions Precautions: Back Precaution Booklet Issued: Yes (comment) Precaution Comments: Educated on 3/3 back precautions. Pt familiar from previous sx. Handout provided. Required Braces or Orthoses: Spinal Brace Spinal Brace: Lumbar corset Restrictions Weight Bearing Restrictions: No      Mobility  Bed Mobility Overal bed mobility: Modified Independent             General bed mobility comments: +rail, increased time    Transfers Overall transfer level: Needs assistance Equipment used: Rolling walker (2 wheeled) Transfers: Sit to/from Stand Sit to Stand: Supervision         General transfer comment: cues for hand placement, supervision for safety  Ambulation/Gait Ambulation/Gait assistance: Supervision Gait Distance (Feet): 300 Feet Assistive device: Rolling walker (2 wheeled) Gait Pattern/deviations: Step-through pattern;Decreased stride length Gait velocity: WFL Gait velocity interpretation: 1.31 - 2.62 ft/sec, indicative of limited community ambulator General Gait Details: steady gait with RW, supervision for safety  Stairs Stairs: Yes Stairs assistance: Min guard Stair Management: One rail Left;Sideways;Step to pattern Number of Stairs: 4 General stair comments: cues for  sequencing  Wheelchair Mobility    Modified Rankin (Stroke Patients Only)       Balance Overall balance assessment: Mild deficits observed, not formally tested                                           Pertinent Vitals/Pain Pain Assessment: No/denies pain    Home Living Family/patient expects to be discharged to:: Private residence Living Arrangements: Spouse/significant other Available Help at Discharge: Family;Available PRN/intermittently Type of Home: House Home Access: Stairs to enter Entrance Stairs-Rails: Left Entrance Stairs-Number of Steps: 4 Home Layout: One level Home Equipment: Bedside commode;Walker - 2 wheels;Adaptive equipment      Prior Function Level of Independence: Independent               Hand Dominance        Extremity/Trunk Assessment   Upper Extremity Assessment Upper Extremity Assessment: Defer to OT evaluation    Lower Extremity Assessment Lower Extremity Assessment: Overall WFL for tasks assessed    Cervical / Trunk Assessment Cervical / Trunk Assessment: Other exceptions Cervical / Trunk Exceptions: s/p lumbar fusion  Communication   Communication: No difficulties  Cognition Arousal/Alertness: Awake/alert Behavior During Therapy: WFL for tasks assessed/performed Overall Cognitive Status: Within Functional Limits for tasks assessed                                        General Comments General comments (skin integrity, edema, etc.): VSS. No c/o dizziness or pain.    Exercises     Assessment/Plan  PT Assessment Patient needs continued PT services  PT Problem List Decreased mobility;Decreased knowledge of precautions;Decreased activity tolerance;Decreased balance;Decreased knowledge of use of DME       PT Treatment Interventions DME instruction;Therapeutic activities;Gait training;Patient/family education;Stair training;Balance training;Functional mobility training    PT Goals  (Current goals can be found in the Care Plan section)  Acute Rehab PT Goals Patient Stated Goal: home PT Goal Formulation: With patient Time For Goal Achievement: 02/18/20 Potential to Achieve Goals: Good    Frequency Min 5X/week   Barriers to discharge        Co-evaluation               AM-PAC PT "6 Clicks" Mobility  Outcome Measure Help needed turning from your back to your side while in a flat bed without using bedrails?: None Help needed moving from lying on your back to sitting on the side of a flat bed without using bedrails?: None Help needed moving to and from a bed to a chair (including a wheelchair)?: A Little Help needed standing up from a chair using your arms (e.g., wheelchair or bedside chair)?: None Help needed to walk in hospital room?: A Little Help needed climbing 3-5 steps with a railing? : A Little 6 Click Score: 21    End of Session Equipment Utilized During Treatment: Gait belt;Back brace Activity Tolerance: Patient tolerated treatment well Patient left: in bed;with call bell/phone within reach Nurse Communication: Mobility status PT Visit Diagnosis: Difficulty in walking, not elsewhere classified (R26.2)    Time: 5409-8119 PT Time Calculation (min) (ACUTE ONLY): 18 min   Charges:   PT Evaluation $PT Eval Low Complexity: 1 Low          Calvin Roth, PT  Office # [phone] Pager [phone]   Calvin Roth 02/11/2020, 8:41 AM

## 2020-02-11 NOTE — Consult Note (Signed)
1 Day Post-Op Consult note  Referring IO:[email or web address] Subjective: Patient reports inability to void Chief complaint: Urinary retention HPI patient is a 74 year old white male who underwent recent spinal surgery with fusion.  Has history of BPH but has not required medical therapy in the past.  Has never seen a urologist.  Had some hesitancy and decreased force of stream prior to procedure.  Had Foley catheter placed at time of procedure and by his report had over 2 L of urine output.  Foley was left overnight and discontinued on postop day #1.  He has been able to void.  Nurses were unable to place catheter.  I was asked to assist in Foley placement.  Foley catheter placement: Utilizing sterile technique penis prepped and draped in usual sterile fashion.  Utilizing viscous lidocaine this was injected down into the urethra without difficulty.  A 18 French coud Foley was placed with minimal resistance into the bladder.  750 cc of clear urine obtained.  Tolerated procedure without immediate complication..  Objective: Vital signs in last 24 hours: Temp:  [97.5 F (36.4 C)-98.1 F (36.7 C)] 98 F (36.7 C) (11/11 1554) Pulse Rate:  [72-92] 82 (11/11 1554) Resp:  [17-20] 18 (11/11 1554) BP: (100-137)/(62-83) 116/65 (11/11 1554) SpO2:  [96 %-100 %] 96 % (11/11 1554)  Intake/Output from previous day: 11/10 0701 - 11/11 0700 In: 2350 [I.V.:2100; IV Piggyback:250] Out: 4100 [Urine:4000; Blood:100] Intake/Output this shift: Total I/O In: 360 [P.O.:360] Out: -   Physical Exam:  General:alert, cooperative and no distress GI: not done Male genitalia: not done Penis: circumcised   Lab Results: Recent Labs    02/11/20 0949  HGB 14.0  HCT 42.7   BMET Recent Labs    02/11/20 0949  NA 131*  K 4.9  CL 96*  CO2 25  GLUCOSE 308*  BUN 30*  CREATININE 1.80*  CALCIUM 8.7*   No results for input(s): LABPT, INR in the last 72 hours. No results for input(s): LABURIN in the last 72  hours. Results for orders placed or performed during the hospital encounter of 02/08/20  SARS CORONAVIRUS 2 (TAT 6-24 HRS) Nasopharyngeal Nasopharyngeal Swab     Status: None   Collection Time: 02/08/20 12:30 PM   Specimen: Nasopharyngeal Swab  Result Value Ref Range Status   SARS Coronavirus 2 NEGATIVE NEGATIVE Final    Comment: (NOTE) SARS-CoV-2 target nucleic acids are NOT DETECTED.  The SARS-CoV-2 RNA is generally detectable in upper and lower respiratory specimens during the acute phase of infection. Negative results do not preclude SARS-CoV-2 infection, do not rule out co-infections with other pathogens, and should not be used as the sole basis for treatment or other patient management decisions. Negative results must be combined with clinical observations, patient history, and epidemiological information. The expected result is Negative.  Fact Sheet for Patients: SugarRoll.be  Fact Sheet for Healthcare Providers: https://www.woods-mathews.com/  This test is not yet approved or cleared by the [place] FDA and  has been authorized for detection and/or diagnosis of SARS-CoV-2 by FDA under an Emergency Use Authorization (EUA). This EUA will remain  in effect (meaning this test can be used) for the duration of the COVID-19 declaration under Se ction 564(b)(1) of the Act, 21 U.S.C. section 360bbb-3(b)(1), unless the authorization is terminated or revoked sooner.  Performed at [facility], [place] [address], [place], [place] [address]     Studies/Results: DG Lumbar Spine 2-3 Views  Result Date: 02/10/2020 CLINICAL DATA:  Surgery, elective. Additional history  provided: Revision of posterior lumbar fusion. Provided fluoroscopy time 10 seconds (8.86 mGy). EXAM: LUMBAR SPINE - 2-3 VIEW; DG C-ARM 1-60 MIN COMPARISON:  Lumbar spine radiographs 09/15/2019. lumbar CT myelogram 10/14/2019 FINDINGS: PA and lateral view intraoperative  fluoroscopic images of the lumbar spine are submitted, 2 images total. The images demonstrate bilateral pedicle screws at what are presumed to be the L2, L3, L4 and L5 levels. Vertical interconnecting rods were not present at the time the images were taken. Interbody spacers are present at what are presumed to be the L2-L3, L3-L4 and L4-L5 levels. Overlying retractors. IMPRESSION: Two intraoperative fluoroscopic images of the lumbar spine, as described. Electronically Signed   By: Kellie Simmering DO   On: 02/10/2020 17:20   DG C-Arm 1-60 Min  Result Date: 02/10/2020 CLINICAL DATA:  Surgery, elective. Additional history provided: Revision of posterior lumbar fusion. Provided fluoroscopy time 10 seconds (8.86 mGy). EXAM: LUMBAR SPINE - 2-3 VIEW; DG C-ARM 1-60 MIN COMPARISON:  Lumbar spine radiographs 09/15/2019. lumbar CT myelogram 10/14/2019 FINDINGS: PA and lateral view intraoperative fluoroscopic images of the lumbar spine are submitted, 2 images total. The images demonstrate bilateral pedicle screws at what are presumed to be the L2, L3, L4 and L5 levels. Vertical interconnecting rods were not present at the time the images were taken. Interbody spacers are present at what are presumed to be the L2-L3, L3-L4 and L4-L5 levels. Overlying retractors. IMPRESSION: Two intraoperative fluoroscopic images of the lumbar spine, as described. Electronically Signed   By: Kellie Simmering DO   On: 02/10/2020 17:20    Assessment/Plan: Continue foley due to acute urinary retention Likely BPH related urinary retention but cannot rule out possible neurologic issue with recent lumbar stenosis significant post void residual Plan/recommendation: Would leave Foley catheter for 7 to 10 days.  I told patient I would follow-up in the office for voiding trial.  Continue tamsulosin 0.4 mg daily in the interim.   LOS: 1 day   Remi Haggard 02/11/2020, 4:59 PM

## 2020-02-11 NOTE — Anesthesia Postprocedure Evaluation (Signed)
Anesthesia Post Note  Patient: AADITH RAUDENBUSH  Procedure(s) Performed: PLIF,IP,PSI L2- L3;EXPL FUSION (N/A )     Patient location during evaluation: PACU Anesthesia Type: General Level of consciousness: awake and alert, awake and oriented Pain management: pain level controlled Vital Signs Assessment: post-procedure vital signs reviewed and stable Respiratory status: spontaneous breathing, nonlabored ventilation and respiratory function stable Cardiovascular status: blood pressure returned to baseline and stable Postop Assessment: no apparent nausea or vomiting Anesthetic complications: no   No complications documented.  Last Vitals:  Vitals:   02/11/20 1554 02/11/20 1914  BP: 116/65 111/63  Pulse: 82 90  Resp: 18 20  Temp: 36.7 C 37.4 C  SpO2: 96% 96%    Last Pain:  Vitals:   02/11/20 1914  TempSrc: Oral  PainSc:     LLE Motor Response: Purposeful movement (02/11/20 1949) LLE Sensation: Numbness (02/11/20 1949) RLE Motor Response: Purposeful movement (02/11/20 1949) RLE Sensation: Numbness (02/11/20 1949)      Catalina Gravel

## 2020-02-11 NOTE — Evaluation (Signed)
Occupational Therapy Evaluation Patient Details Name: Calvin Roth MRN: [id number] DOB: [birth year] Today's Date: 02/11/2020    History of Present Illness Pt is a 74 y.o. male s/p lumbar fusion. PMH consists of DM and previous back sx.   Clinical Impression   Patient admitted for the above diagnosis and procedure.  He is very versed in all precautions and safe completion of all functional tasks.  He has no expressed pain, and overall feels quite good.  OT reviewed precautions with daily tasks he will perform, no OT needs identified in the acute or post acute setting.  All questions answered.      Follow Up Recommendations  No OT follow up    Equipment Recommendations  None recommended by OT    Recommendations for Other Services       Precautions / Restrictions Precautions Precautions: Back Precaution Booklet Issued: Yes (comment) Precaution Comments: Educated on 3/3 back precautions. Pt familiar from previous sx. Handout provided. Required Braces or Orthoses: Spinal Brace Spinal Brace: Lumbar corset Restrictions Weight Bearing Restrictions: No      Mobility Bed Mobility Overal bed mobility: Modified Independent             General bed mobility comments: +rail, increased time    Transfers Overall transfer level: Modified independent Equipment used: Rolling walker (2 wheeled) Transfers: Sit to/from Stand Sit to Stand: Supervision         General transfer comment: Walking the halls.    Balance Overall balance assessment: Mild deficits observed, not formally tested                                         ADL either performed or assessed with clinical judgement   ADL Overall ADL's : Modified independent                                       General ADL Comments: Patient with mild stiffness, but nothing that impeads independence.     Vision Baseline Vision/History: Wears glasses Wears Glasses: At all times Patient  Visual Report: No change from baseline       Perception     Praxis      Pertinent Vitals/Pain Pain Assessment: No/denies pain     Hand Dominance Right   Extremity/Trunk Assessment Upper Extremity Assessment Upper Extremity Assessment: Overall WFL for tasks assessed   Lower Extremity Assessment Lower Extremity Assessment: Defer to PT evaluation   Cervical / Trunk Assessment Cervical / Trunk Assessment: Other exceptions Cervical / Trunk Exceptions: s/p lumbar fusion   Communication Communication Communication: No difficulties   Cognition Arousal/Alertness: Awake/alert Behavior During Therapy: WFL for tasks assessed/performed Overall Cognitive Status: Within Functional Limits for tasks assessed                                     General Comments  VSS.     Exercises     Shoulder Instructions      Home Living Family/patient expects to be discharged to:: Private residence Living Arrangements: Spouse/significant other Available Help at Discharge: Family;Available PRN/intermittently Type of Home: House Home Access: Stairs to enter [employer] of Steps: 4 Entrance Stairs-Rails: Left Home Layout: One level     Bathroom Shower/Tub: Walk-in shower  Bathroom Toilet: Handicapped height     Home Equipment: Bedside commode;Walker - 2 wheels;Adaptive equipment Adaptive Equipment: Reacher;Sock aid;Other (Comment) Additional Comments: dressing stick      Prior Functioning/Environment Level of Independence: Independent with assistive device(s)        Comments: Utilizes dressing stick and LH Reacher.        OT Problem List: Impaired balance (sitting and/or standing)      OT Treatment/Interventions:      OT Goals(Current goals can be found in the care plan section) Acute Rehab OT Goals Patient Stated Goal: Returning home OT Goal Formulation: With patient Time For Goal Achievement: 02/12/20 Potential to Achieve Goals: Good  OT  Frequency:     Barriers to D/C:            Co-evaluation              AM-PAC OT "6 Clicks" Daily Activity     Outcome Measure Help from another person eating meals?: None Help from another person taking care of personal grooming?: None Help from another person toileting, which includes using toliet, bedpan, or urinal?: None Help from another person bathing (including washing, rinsing, drying)?: None Help from another person to put on and taking off regular upper body clothing?: None Help from another person to put on and taking off regular lower body clothing?: None 6 Click Score: 24   End of Session Equipment Utilized During Treatment: Rolling walker Nurse Communication: Mobility status  Activity Tolerance: Patient tolerated treatment well Patient left: in bed;with call bell/phone within reach  OT Visit Diagnosis: Unsteadiness on feet (R26.81)                Time: 7654-6503 OT Time Calculation (min): 27 min Charges:  OT General Charges $OT Visit: 1 Visit OT Evaluation $OT Eval Moderate Complexity: 1 Mod  02/11/2020  Rich, OTR/L  Acute Rehabilitation Services  Office:  [phone]   Metta Clines 02/11/2020, 9:36 AM

## 2020-02-11 NOTE — Progress Notes (Signed)
Patient's foley cath was d/c'd around 6 am. Patient having difficulty voiding. Flomax given as prescribed. Fluids encouraged. Bladder scanned and shows 135ml. Tried to in and out cath as sometimes the bladder scanner does not show accurate amount. Resistance felt while trying to in and out cath with no urine noted. MD notified.

## 2020-02-11 NOTE — Progress Notes (Signed)
Inpatient Diabetes Program Recommendations  AACE/ADA: New Consensus Statement on Inpatient Glycemic Control (2015)  Target Ranges:  Prepandial:   less than 140 mg/dL      Peak postprandial:   less than 180 mg/dL (1-2 hours)      Critically ill patients:  140 - 180 mg/dL   Lab Results  Component Value Date   GLUCAP 294 (H) 02/11/2020   HGBA1C 9.3 (H) 04/27/2015    Review of Glycemic Control Results for Calvin Roth, ASBRIDGE" (MRN [id number]) as of 02/11/2020 09:57  Ref. Range 02/10/2020 10:01 02/10/2020 16:26 02/10/2020 17:05 02/10/2020 21:22 02/11/2020 06:17  Glucose-Capillary Latest Ref Range: 70 - 99 mg/dL 115 (H) 54 (L) 122 (H) 246 (H) 294 (H)   Diabetes history:  DM2 Outpatient Diabetes medications:  Levemir 56 units daily Metfromin 1000 mg bid Current orders for Inpatient glycemic control:  Novolog 0-20 units tid Metformin 1000 mg bid  Inpatient Diabetes Program Recommendations:     levemir 45 units daily (80% of home dose)  Hypoglycemia yesterday at 1626 54mg /dL likely due to NPO status

## 2020-02-12 LAB — GLUCOSE, CAPILLARY
Glucose-Capillary: 241 mg/dL — ABNORMAL HIGH (ref 70–99)
Glucose-Capillary: 215 mg/dL — ABNORMAL HIGH (ref 70–99)

## 2020-02-12 MED ORDER — TAMSULOSIN HCL 0.4 MG PO CAPS: 0.4000 mg | ORAL_CAPSULE | Freq: Every day | ORAL | 1 refills | Status: AC

## 2020-02-12 MED ORDER — CYCLOBENZAPRINE HCL 10 MG PO TABS: 10.0000 mg | ORAL_TABLET | Freq: Three times a day (TID) | ORAL | 1 refills | Status: DC | PRN

## 2020-02-12 MED ORDER — OXYCODONE-ACETAMINOPHEN 5-325 MG PO TABS
1.0000 | ORAL_TABLET | ORAL | 0 refills | Status: DC | PRN
Start: 2020-02-12 — End: 2020-12-19

## 2020-02-12 MED ORDER — DOCUSATE SODIUM 100 MG PO CAPS
100.0000 mg | ORAL_CAPSULE | Freq: Two times a day (BID) | ORAL | 0 refills | Status: DC
Start: 2020-02-12 — End: 2020-12-19

## 2020-02-12 MED ORDER — OXYCODONE-ACETAMINOPHEN 5-325 MG PO TABS
1.0000 | ORAL_TABLET | ORAL | Status: DC | PRN
  Administered 2020-02-12: 2 via ORAL
  Filled 2020-02-12: qty 2

## 2020-02-12 MED ORDER — CHLORHEXIDINE GLUCONATE CLOTH 2 % EX PADS
6.0000 | MEDICATED_PAD | Freq: Every day | CUTANEOUS | Status: DC
  Administered 2020-02-12: 6 via TOPICAL

## 2020-02-12 MED FILL — Heparin Sodium (Porcine) Inj 1000 Unit/ML: INTRAMUSCULAR | Qty: 30 | Status: AC

## 2020-02-12 MED FILL — Sodium Chloride IV Soln 0.9%: INTRAVENOUS | Qty: 1000 | Status: AC

## 2020-02-12 NOTE — Progress Notes (Signed)
Patient is discharged from room 3C02 at this time. Alert and in stable condition. IV site d/c'd and instructions read to patient and spouse with understanding verbalized. Called Dr. Elmyra Ricks office and spoke with Jackelyn Poling for follow up for foley cath, she will call patient after speaking with Dr. Milford Cage. Patient left unit via wheelchair with all belongings at side.

## 2020-02-12 NOTE — Progress Notes (Signed)
Inpatient Diabetes Program Recommendations  AACE/ADA: New Consensus Statement on Inpatient Glycemic Control (2015)  Target Ranges:  Prepandial:   less than 140 mg/dL      Peak postprandial:   less than 180 mg/dL (1-2 hours)      Critically ill patients:  140 - 180 mg/dL   Lab Results  Component Value Date   GLUCAP 241 (H) 02/12/2020   HGBA1C 7.6 (H) 02/11/2020    Review of Glycemic Control Results for Calvin Roth, Calvin Roth" (MRN [id number]) as of 02/12/2020 10:57  Ref. Range 02/11/2020 17:08 02/11/2020 21:04 02/12/2020 06:25  Glucose-Capillary Latest Ref Range: 70 - 99 mg/dL 123 (H) 250 (H) 241 (H)    Inpatient Diabetes Program Recommendations:     Novolog 0-5 units qhs  Will continue to follow while inpatient.  Thank you, Reche Dixon, RN, BSN Diabetes Coordinator Inpatient Diabetes Program [phone] (team pager from 8a-5p)

## 2020-02-12 NOTE — Progress Notes (Signed)
Physical Therapy Treatment Patient Details Name: Calvin Roth MRN: [id number] DOB: November 19, 1945 Today's Date: 02/12/2020    History of Present Illness Pt is a 74 y.o. male s/p lumbar fusion. PMH consists of DM and previous back sx.    PT Comments    Pt seated edge of bed on arrival this session.  He has difficulty placing brace and recalling spinal precautions.  He was only able to recall 2/3 and required cues to avoid placing brace backwards.  Pt continues to be appropriate to return home with support of his wife.  Increased time spent on educating patient on correct fit on back brace and maintain spinal precautions.  Pt seated edge of bed at end of session awaiting wife's arrival.     Follow Up Recommendations  No PT follow up     Equipment Recommendations  None recommended by PT    Recommendations for Other Services       Precautions / Restrictions Precautions Precautions: Back Precaution Booklet Issued: Yes (comment) Precaution Comments: Educated on 3/3 back precautions. Despite previous surgery he required re-education on his precautions. Required Braces or Orthoses: Spinal Brace Spinal Brace: Lumbar corset (required assistance to donn as he was attempting to put on backwards.) Restrictions Weight Bearing Restrictions: No    Mobility  Bed Mobility               General bed mobility comments: Pt seated edge of bed on arrival this session.  Transfers Overall transfer level: Modified independent Equipment used: Rolling walker (2 wheeled) Transfers: Sit to/from Stand Sit to Stand: Min guard         General transfer comment: Cues for hand placement to and from seated surface  Ambulation/Gait Ambulation/Gait assistance: Supervision;Min guard Gait Distance (Feet): 350 Feet Assistive device: Rolling walker (2 wheeled)   Gait velocity: [facility]   General Gait Details: More unsteady with gt, veering to R and required assistance of min guard to cue body position in  RW and improve safety.   Stairs Stairs: Yes Stairs assistance: Min guard Stair Management: One rail Left;Sideways;Step to pattern   General stair comments: cues for sequencing and foot placement to allow enough room for both feet on each stair.   Wheelchair Mobility    Modified Rankin (Stroke Patients Only)       Balance Overall balance assessment: Mild deficits observed, not formally tested                                          Cognition Arousal/Alertness: Awake/alert Behavior During Therapy: WFL for tasks assessed/performed Overall Cognitive Status: Within Functional Limits for tasks assessed                                        Exercises      General Comments General comments (skin integrity, edema, etc.): Pt has one minor impairment to the R but with min guard able to self correct.      Pertinent Vitals/Pain Pain Assessment: No/denies pain    Home Living                      Prior Function            PT Goals (current goals can now be found in the care plan section)  Acute Rehab PT Goals Patient Stated Goal: Returning home Potential to Achieve Goals: Good Progress towards PT goals: Progressing toward goals    Frequency    Min 5X/week      PT Plan Current plan remains appropriate    Co-evaluation              AM-PAC PT "6 Clicks" Mobility   Outcome Measure  Help needed turning from your back to your side while in a flat bed without using bedrails?: None Help needed moving from lying on your back to sitting on the side of a flat bed without using bedrails?: None Help needed moving to and from a bed to a chair (including a wheelchair)?: A Little Help needed standing up from a chair using your arms (e.g., wheelchair or bedside chair)?: A Little Help needed to walk in hospital room?: A Little Help needed climbing 3-5 steps with a railing? : A Little 6 Click Score: 20    End of Session  Equipment Utilized During Treatment: Back brace Activity Tolerance: Patient tolerated treatment well Patient left: in bed;with call bell/phone within reach Nurse Communication: Mobility status PT Visit Diagnosis: Difficulty in walking, not elsewhere classified (R26.2)     Time: 9030-1499 PT Time Calculation (min) (ACUTE ONLY): 18 min  Charges:  $Gait Training: 8-22 mins                     Erasmo Leventhal , PTA Acute Rehabilitation Services Pager [phone] Office [phone]     Jamarea Selner Eli Hose 02/12/2020, 12:37 PM

## 2020-02-12 NOTE — Discharge Summary (Signed)
Physician Discharge Summary  Patient ID: Calvin Roth MRN: [id number] DOB/AGE: [id number] y.o.  Admit date: 02/10/2020 Discharge date: 02/12/2020  Admission Diagnoses: Lumbar adjacent disease with spondylolisthesis, lumbar spinal stenosis with neurogenic claudication, lumbago, lumbar radiculopathy, urinary retention  Discharge Diagnoses: The same Active Problems:   Spinal stenosis   Discharged Condition: good  Hospital Course: I performed an exploration of patient's lumbar fusion with an extension to L2-3 on 02/10/2020.  The surgery went well.  The patient's postoperative course was unremarkable for urinary retention.  He was started on Flomax.  Dr. Milford Cage, urology saw the patient and replace his Foley.  He instructed him to follow-up in his office next week.  On postoperative day #2 the patient requested discharge to home.  He was given written and oral discharge instructions.  All his questions were answered.  Consults: PT, OT, care management, urology Significant Diagnostic Studies: None Treatments: L2-3 decompression, instrumentation and fusion, exploration of lumbar fusion, placement of Foley catheter Discharge Exam: Blood pressure 104/63, pulse 89, temperature 98.4 F (36.9 C), temperature source Oral, resp. rate 18, height 5\' 11"  (1.803 m), weight 113.6 kg, SpO2 90 %. The patient is alert and pleasant.  His lower extremity strength is normal.  He looks well.  His dressing is clean and dry.  Disposition: Home  Discharge Instructions    Call MD for:  difficulty breathing, headache or visual disturbances   Complete by: As directed    Call MD for:  extreme fatigue   Complete by: As directed    Call MD for:  hives   Complete by: As directed    Call MD for:  persistant dizziness or light-headedness   Complete by: As directed    Call MD for:  persistant nausea and vomiting   Complete by: As directed    Call MD for:  redness, tenderness, or signs of infection (pain,  swelling, redness, odor or green/yellow discharge around incision site)   Complete by: As directed    Call MD for:  severe uncontrolled pain   Complete by: As directed    Call MD for:  temperature >100.4   Complete by: As directed    Diet - low sodium heart healthy   Complete by: As directed    Discharge instructions   Complete by: As directed    Call [phone] for a followup appointment. Take a stool softener while you are using pain medications.   Driving Restrictions   Complete by: As directed    Do not drive for 2 weeks.   Increase activity slowly   Complete by: As directed    Lifting restrictions   Complete by: As directed    Do not lift more than 5 pounds. No excessive bending or twisting.   May shower / Bathe   Complete by: As directed    Remove the dressing for 3 days after surgery.  You may shower, but leave the incision alone.   Remove dressing in 24 hours   Complete by: As directed      Allergies as of 02/12/2020   No Known Allergies     Medication List    TAKE these medications   amLODipine 10 MG tablet Commonly known as: NORVASC Take 10 mg by mouth daily.   atorvastatin 10 MG tablet Commonly known as: LIPITOR Take 10 mg by mouth daily.   carvedilol 6.25 MG tablet Commonly known as: COREG Take 6.25 mg by mouth 2 (two) times daily.   cyclobenzaprine 10 MG tablet  Commonly known as: FLEXERIL Take 1 tablet (10 mg total) by mouth 3 (three) times daily as needed for muscle spasms.   docusate sodium 100 MG capsule Commonly known as: COLACE Take 1 capsule (100 mg total) by mouth 2 (two) times daily.   dorzolamide-timolol 22.3-6.8 MG/ML ophthalmic solution Commonly known as: COSOPT Place 1 drop into the right eye 2 (two) times daily.   gabapentin 400 MG capsule Commonly known as: NEURONTIN Take 1,200 mg by mouth at bedtime.   Levemir FlexTouch 100 UNIT/ML FlexPen Generic drug: insulin detemir Inject 56 Units into the skin daily.    losartan-hydrochlorothiazide 100-12.5 MG tablet Commonly known as: HYZAAR Take 1 tablet by mouth daily.   metFORMIN 500 MG 24 hr tablet Commonly known as: GLUCOPHAGE-XR Take 1,000 mg by mouth 2 (two) times daily.   omeprazole 20 MG capsule Commonly known as: PRILOSEC Take 20 mg by mouth daily.   oxyCODONE-acetaminophen 5-325 MG tablet Commonly known as: PERCOCET/ROXICET Take 1-2 tablets by mouth every 4 (four) hours as needed for moderate pain.   tamsulosin 0.4 MG Caps capsule Commonly known as: FLOMAX Take 1 capsule (0.4 mg total) by mouth daily.        Signed: Ophelia Charter 02/12/2020, 7:57 AM

## 2020-02-12 NOTE — Discharge Instructions (Signed)

## 2020-05-03 DIAGNOSIS — E11311 Type 2 diabetes mellitus with unspecified diabetic retinopathy with macular edema: Secondary | ICD-10-CM

## 2020-05-03 HISTORY — DX: Type 2 diabetes mellitus with unspecified diabetic retinopathy with macular edema: E11.311

## 2020-05-10 ENCOUNTER — Other Ambulatory Visit: Payer: Self-pay | Admitting: Internal Medicine

## 2020-05-10 DIAGNOSIS — I10 Essential (primary) hypertension: Secondary | ICD-10-CM

## 2020-05-10 DIAGNOSIS — I712 Thoracic aortic aneurysm, without rupture, unspecified: Secondary | ICD-10-CM

## 2020-05-17 ENCOUNTER — Ambulatory Visit
Admission: RE | Admit: 2020-05-17 | Discharge: 2020-05-17 | Disposition: A | Discharge status: Home / Self Care | Payer: Medicare Other | Source: Ambulatory Visit | Attending: Internal Medicine | Admitting: Internal Medicine

## 2020-05-17 ENCOUNTER — Other Ambulatory Visit: Payer: Self-pay

## 2020-05-17 DIAGNOSIS — I712 Thoracic aortic aneurysm, without rupture, unspecified: Secondary | ICD-10-CM

## 2020-05-17 DIAGNOSIS — I10 Essential (primary) hypertension: Secondary | ICD-10-CM | POA: Insufficient documentation

## 2020-05-17 MED ORDER — IOHEXOL 350 MG/ML SOLN
100.0000 mL | Freq: Once | INTRAVENOUS | Status: AC | PRN
  Administered 2020-05-17: 80 mL via INTRAVENOUS

## 2020-05-20 ENCOUNTER — Other Ambulatory Visit: Payer: Self-pay | Admitting: Internal Medicine

## 2020-05-20 DIAGNOSIS — I712 Thoracic aortic aneurysm, without rupture, unspecified: Secondary | ICD-10-CM

## 2020-05-23 ENCOUNTER — Other Ambulatory Visit: Payer: Self-pay | Admitting: Internal Medicine

## 2020-05-23 DIAGNOSIS — I712 Thoracic aortic aneurysm, without rupture, unspecified: Secondary | ICD-10-CM

## 2020-05-25 ENCOUNTER — Encounter: Payer: Self-pay | Admitting: Dermatology

## 2020-05-25 ENCOUNTER — Encounter: Payer: Medicare Other | Admitting: Dermatology

## 2020-05-25 ENCOUNTER — Ambulatory Visit (INDEPENDENT_AMBULATORY_CARE_PROVIDER_SITE_OTHER): Payer: Medicare Other | Admitting: Dermatology

## 2020-05-25 ENCOUNTER — Other Ambulatory Visit: Payer: Self-pay

## 2020-05-25 DIAGNOSIS — D1800 Hemangioma unspecified site: Secondary | ICD-10-CM

## 2020-05-25 DIAGNOSIS — L821 Other seborrheic keratosis: Secondary | ICD-10-CM

## 2020-05-25 DIAGNOSIS — Z1283 Encounter for screening for malignant neoplasm of skin: Secondary | ICD-10-CM | POA: Diagnosis not present

## 2020-05-25 DIAGNOSIS — L814 Other melanin hyperpigmentation: Secondary | ICD-10-CM

## 2020-05-25 DIAGNOSIS — Z86018 Personal history of other benign neoplasm: Secondary | ICD-10-CM | POA: Diagnosis not present

## 2020-05-25 DIAGNOSIS — D229 Melanocytic nevi, unspecified: Secondary | ICD-10-CM

## 2020-05-25 DIAGNOSIS — L578 Other skin changes due to chronic exposure to nonionizing radiation: Secondary | ICD-10-CM

## 2020-05-25 DIAGNOSIS — L570 Actinic keratosis: Secondary | ICD-10-CM | POA: Diagnosis not present

## 2020-05-25 DIAGNOSIS — B353 Tinea pedis: Secondary | ICD-10-CM

## 2020-05-25 MED ORDER — KETOCONAZOLE 2 % EX CREA: 1.0000 "application " | TOPICAL_CREAM | Freq: Two times a day (BID) | CUTANEOUS | 11 refills | Status: AC

## 2020-05-25 NOTE — Patient Instructions (Signed)
Cryotherapy Aftercare  . Wash gently with soap and water everyday.   . Apply Vaseline and Band-Aid daily until healed.  

## 2020-05-25 NOTE — Progress Notes (Signed)
   Follow-Up Visit   Subjective  Calvin Roth is a 75 y.o. male who presents for the following: Annual Exam (Mole check ). 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 The patient presents for Total-Body Skin Exam (TBSE) for skin cancer screening and mole check.  The following portions of the chart were reviewed this encounter and updated as appropriate:   Tobacco  Allergies  Meds  Problems  Med Hx  Surg Hx  Fam Hx     Review of Systems:  No other skin or systemic complaints except as noted in HPI or Assessment and Plan.  Objective  Well appearing patient in no apparent distress; mood and affect are within normal limits.  A full examination was performed including scalp, head, eyes, ears, nose, lips, neck, chest, axillae, abdomen, back, buttocks, bilateral upper extremities, bilateral lower extremities, hands, feet, fingers, toes, fingernails, and toenails. All findings within normal limits unless otherwise noted below.  Objective  multiple see history: Scar with no evidence of recurrence.   Objective  Left Foot - Anterior: Scaling and maceration web spaces and over distal and lateral soles.   Objective  scalp,face (6): Erythematous thin papules/macules with gritty scale.    Assessment & Plan  History of dysplastic nevus multiple see history Clear. Observe for recurrence. Call clinic for new or changing lesions.  Recommend regular skin exams, daily broad-spectrum spf 30+ sunscreen use, and photoprotection.     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  Start Ketoconazole 2% cream apply to feet bid   Ordered Medications: ketoconazole (NIZORAL) 2 % cream  AK (actinic keratosis) (6) scalp,face  Destruction of lesion - scalp,face Complexity: simple   Destruction method: cryotherapy   Informed consent: discussed and consent obtained   Timeout:  patient name, date of birth, surgical site, and procedure  verified Lesion destroyed using liquid nitrogen: Yes   Region frozen until ice ball extended beyond lesion: Yes   Outcome: patient tolerated procedure well with no complications   Post-procedure details: wound care instructions given    Skin cancer screening   Lentigines - Scattered tan macules - Due to sun exposure - Benign-appering, observe - Recommend daily broad spectrum sunscreen SPF 30+ to sun-exposed areas, reapply every 2 hours as needed. - Call for any changes  Seborrheic Keratoses - Stuck-on, waxy, tan-brown papules and plaques  - Discussed benign etiology and prognosis. - Observe - Call for any changes  Melanocytic Nevi - Tan-brown and/or pink-flesh-colored symmetric macules and papules - Benign appearing on exam today - Observation - Call clinic for new or changing moles - Recommend daily use of broad spectrum spf 30+ sunscreen to sun-exposed areas.   Hemangiomas - Red papules - Discussed benign nature - Observe - Call for any changes  Actinic Damage - Chronic, secondary to cumulative UV/sun exposure - diffuse scaly erythematous macules with underlying dyspigmentation - Recommend daily broad spectrum sunscreen SPF 30+ to sun-exposed areas, reapply every 2 hours as needed.  - Call for new or changing lesions.  Skin cancer screening performed today.  Return in about 1 year (around 05/25/2021) for TBSE.  IMarye Round, CMA, am acting as scribe for Sarina Ser, MD .  Documentation: I have reviewed the above documentation for accuracy and completeness, and I agree with the above.  Sarina Ser, MD

## 2020-05-28 ENCOUNTER — Encounter: Payer: Self-pay | Admitting: Dermatology

## 2020-12-12 ENCOUNTER — Other Ambulatory Visit: Payer: Self-pay | Admitting: Surgery

## 2020-12-22 ENCOUNTER — Other Ambulatory Visit: Payer: Medicare Other

## 2020-12-22 ENCOUNTER — Encounter: Payer: Self-pay | Admitting: Surgery

## 2020-12-22 ENCOUNTER — Encounter
Admission: RE | Admit: 2020-12-22 | Discharge: 2020-12-22 | Disposition: A | Discharge status: Home / Self Care | Payer: Medicare Other | Source: Ambulatory Visit | Attending: Surgery | Admitting: Surgery

## 2020-12-22 ENCOUNTER — Other Ambulatory Visit: Payer: Self-pay

## 2020-12-22 HISTORY — DX: Hyperlipidemia, unspecified: E78.5

## 2020-12-22 HISTORY — DX: Gastro-esophageal reflux disease without esophagitis: K21.9

## 2020-12-22 HISTORY — DX: Atherosclerotic heart disease of native coronary artery without angina pectoris: I25.10

## 2020-12-22 NOTE — Patient Instructions (Addendum)
Your procedure is scheduled on:12-28-20 Wednesday Report to the Registration Desk on the 1st floor of the [facility].Then proceed to 2nd floor Surgery Desk in the [facility] To find out your arrival time, please call [phone] between 1PM - 3PM on:12-27-20 Tuesday  REMEMBER: Instructions that are not followed completely may result in serious medical risk, up to and including death; or upon the discretion of your surgeon and anesthesiologist your surgery may need to be rescheduled.  Do not eat food after midnight the night before surgery.  No gum chewing, lozengers or hard candies.  You may however, drink Water up to 2 hours before you are scheduled to arrive for your surgery. Do not drink anything within 2 hours of your scheduled arrival time.  Type 1 and Type 2 diabetics should only drink water.  TAKE THESE MEDICATIONS THE MORNING OF SURGERY WITH A SIP OF WATER: -atorvastatin (LIPITOR) 10 MG tablet -carvedilol (COREG) 6.25 MG tablet -omeprazole (PRILOSEC) 20 MG capsule-(take one the night before and one on the morning of surgery - helps to prevent nausea after surgery.)  Stop Metformin 2 days prior to surgery-Last dose on 12-25-20 Sunday  NO Insulin the Morning of surgery  One week prior to surgery: Stop Anti-inflammatories (NSAIDS) such as Advil, Aleve, Ibuprofen, Motrin, Naproxen, Naprosyn and Aspirin based products such as Excedrin, Goodys Powder, BC Powder.You may however, continue to take Tylenol if needed for pain up until the day of surgery.  Stop ANY OVER THE COUNTER supplements/vitamins NOW (12-22-20) until after surgery (Multivitamin)  No Alcohol for 24 hours before or after surgery.  No Smoking including e-cigarettes for 24 hours prior to surgery.  No chewable tobacco products for at least 6 hours prior to surgery.  No nicotine patches on the day of surgery.  Do not use any "recreational" drugs for at least a week prior to your surgery.  Please be advised that the  combination of cocaine and anesthesia may have negative outcomes, up to and including death. If you test positive for cocaine, your surgery will be cancelled.  On the morning of surgery brush your teeth with toothpaste and water, you may rinse your mouth with mouthwash if you wish. Do not swallow any toothpaste or mouthwash.  Use CHG Soap as directed on instruction sheet.  Do not wear jewelry, make-up, hairpins, clips or nail polish.  Do not wear lotions, powders, or perfumes.   Do not shave body from the neck down 48 hours prior to surgery just in case you cut yourself which could leave a site for infection.  Also, freshly shaved skin may become irritated if using the CHG soap.  Contact lenses, hearing aids and dentures may not be worn into surgery.  Do not bring valuables to the hospital. [facility] is not responsible for any missing/lost belongings or valuables.  Notify your doctor if there is any change in your medical condition (cold, fever, infection).  Wear comfortable clothing (specific to your surgery type) to the hospital.  After surgery, you can help prevent lung complications by doing breathing exercises.  Take deep breaths and cough every 1-2 hours. Your doctor may order a device called an Incentive Spirometer to help you take deep breaths. When coughing or sneezing, hold a pillow firmly against your incision with both hands. This is called "splinting." Doing this helps protect your incision. It also decreases belly discomfort.  If you are being admitted to the hospital overnight, leave your suitcase in the car. After surgery it may  be brought to your room.  If you are being discharged the day of surgery, you will not be allowed to drive home. You will need a responsible adult (18 years or older) to drive you home and stay with you that night.   If you are taking public transportation, you will need to have a responsible adult (18 years or older) with you. Please  confirm with your physician that it is acceptable to use public transportation.   Please call the [facility] at [phone] if you have any questions about these instructions.  Surgery Visitation Policy:  Patients undergoing a surgery or procedure may have one family member or support person with them as long as that person is not COVID-19 positive or experiencing its symptoms.  That person may remain in the waiting area during the procedure and may rotate out with other people.  Inpatient Visitation:    Visiting hours are 7 a.m. to 8 p.m. Up to two visitors ages 16+ are allowed at one time in a patient room. The visitors may rotate out with other people during the day. Visitors must check out when they leave, or other visitors will not be allowed. One designated support person may remain overnight. The visitor must pass COVID-19 screenings, use hand sanitizer when entering and exiting the patient's room and wear a mask at all times, including in the patient's room. Patients must also wear a mask when staff or their visitor are in the room. Masking is required regardless of vaccination status.

## 2020-12-23 ENCOUNTER — Encounter: Payer: Self-pay | Admitting: Surgery

## 2020-12-23 NOTE — Progress Notes (Signed)
Perioperative Services  Pre-Admission/Anesthesia Testing Clinical Review  Date: 12/27/20  Patient Demographics:  Name: Calvin Roth DOB:   27-Mar-1946 MRN:   [id number]  Planned Surgical Procedure(s):    Case: [id number] Date/Time: 12/28/20 1110   Procedure: LEFT THUMB ULNAR COLLATERAL LIGAMENT REPAIR/RECONSTRUCTION (Left: Thumb)   Anesthesia type: Choice   Pre-op diagnosis: Complete tear of ulnar collateral ligament of interphalangeal joint of thumb S63.629A   Location: [place] 02 / August [facility]S FOR ANESTHESIA GROUP   Surgeons: Corky Mull, MD   NOTE: Available PAT nursing documentation and vital signs have been reviewed. Clinical nursing staff has updated patient's PMH/PSHx, current medication list, and drug allergies/intolerances to ensure comprehensive history available to assist in medical decision making as it pertains to the aforementioned surgical procedure and anticipated anesthetic course. Extensive review of available clinical information performed. [place] PMH and PSHx updated with any diagnoses/procedures that  may have been inadvertently omitted during his intake with the pre-admission testing department's nursing staff.  Clinical Discussion:  Calvin Roth is a 75 y.o. male who is submitted for pre-surgical anesthesia review and clearance prior to him undergoing the above procedure. Patient has never been a smoker. Pertinent PMH includes: CAD, AAA, aortic atherosclerosis, aortic root dilatation, HTN, HLD, T2DM, CKD-III, GERD (on daily PPI), OA, lumbar DDD.  Patient has a known history of AAA.  He is under the care of Dr. Frazier Richards, MD (internal medicine); does not see cardiology.  Patient was last seen in the internal medicine clinic on 11/29/2020; notes reviewed.  At the time of his clinic visit, patient denied any episodes of chest pain, short of breath, PND, orthopnea, palpitations, significant peripheral edema, vertiginous symptoms, or presyncope/syncope.  Last  imaging patient's AAA was performed in 05/2020 revealing a 4.4 cm fusiform ascending aortic aneurysm; stability documented.  Patient undergoes serial CT monitoring of his aneurysmal defect on an annual basis.  Blood pressure reasonably controlled at 130/80 on currently prescribed beta-blocker, ARB, and diuretic therapies.  Patient is on a statin for his HLD.  T2DM suboptimally controlled on currently prescribed regimen; last Hgb A1c was 8.1% when checked on 09/02/2020. Functional capacity, as defined by DASI, is documented as being >/= 4 METS.  No changes were made to patient's medication regimen.  Patient to continue routine and as needed follow-ups with internal medicine.  Calvin Roth is scheduled for an elective LEFT THUMB ULNAR COLLATERAL LIGAMENT REPAIR/RECONSTRUCTION  on 12/28/2020 with Dr. Milagros Evener, MD.  Given patient's past medical history significant for cardiovascular diagnoses, and the fact that patient is not currently followed by cardiology, presurgical medical clearance was sought from the patient's primary care provider by the PAT team. Per Dr. Ouida Sills, "there is minimal cardiopulmonary risk for thumb surgery. Patient may proceed at an overall MODERATE risk of significant perioperative complications". Patient is not on any type of anticoagulation or antiplatelets therapies that would need to be held perioperatively.   Patient denies previous perioperative complications with anesthesia in the past. In review of the available records, it is noted that patient underwent a general anesthetic course at [facility] (ASA III) in 02/2020 without documented complications.   Vitals with BMI 02/12/2020 02/12/2020 02/12/2020  Height - - -  Weight - - -  BMI - - -  Systolic [phone]  Diastolic 84 63 62  Pulse 92 89 94    Providers/Specialists:   NOTE: Primary physician provider listed below. Patient may have been seen by APP or  partner within same practice.   PROVIDER ROLE /  SPECIALTY LAST OV  Poggi, Marshall Cork, MD Orthopedics (Surgeon) 12/08/2020  Kirk Ruths, MD Primary Care Provider 11/29/2020   Allergies:  Patient has no known allergies.  Current Home Medications:   No current facility-administered medications for this encounter.    atorvastatin (LIPITOR) 10 MG tablet   carvedilol (COREG) 6.25 MG tablet   dorzolamide-timolol (COSOPT) 22.3-6.8 MG/ML ophthalmic solution   gabapentin (NEURONTIN) 400 MG capsule   LEVEMIR FLEXTOUCH 100 UNIT/ML Pen   losartan-hydrochlorothiazide (HYZAAR) 100-12.5 MG tablet   metFORMIN (GLUCOPHAGE-XR) 500 MG 24 hr tablet   Multiple Vitamin (MULTIVITAMIN WITH MINERALS) TABS tablet   omeprazole (PRILOSEC) 20 MG capsule   oxymetazoline (AFRIN) 0.05 % nasal spray   tamsulosin (FLOMAX) 0.4 MG CAPS capsule   cyclobenzaprine (FLEXERIL) 10 MG tablet   History:   Past Medical History:  Diagnosis Date   Actinic keratosis    Aortic atherosclerosis (HCC)    Aortic root dilatation ([place]) 03/12/2019   a.) CTA 03/22/2019 --> 4.5 cm   Ascending aortic aneurysm ([place]) 10/31/2017   a.) fusiform; measured 4.4 cm   BPH (benign prostatic hyperplasia)    CKD (chronic kidney disease), stage III (HCC)    Coronary artery disease    DDD (degenerative disc disease), lumbar    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 Moderate to marked atypia, edge involved. Excised 09/12/2006, margins free.   Dysplastic nevus 11/19/2007   Left costal margin/infrapectoral. Slight to moderate atypia, margin focally involved.   Dysplastic nevus 11/19/2007   Right lateral flank near waistline. Moderate atypia, extends to one edge.    Dysplastic nevus 05/25/2019   Left lateral tricep. Moderate atypia, deep margin involved.    GERD (gastroesophageal reflux disease)    Hepatic steatosis    Hyperlipidemia    Hypertension    Intermittent alternating exotropia    Neurogenic bladder    a.)  self catheriterizes FOUR times daily   Osteoarthritis    Serrated adenoma of colon 07/17/2016   Sliding hiatal hernia    T2DM (type 2 diabetes mellitus) ([place])    Past Surgical History:  Procedure Laterality Date   CATARACT EXTRACTION Left 05/15/2018   CATARACT EXTRACTION Right 04/03/2018   CHOLECYSTECTOMY     COLONOSCOPY WITH PROPOFOL N/A 07/17/2016   Procedure: COLONOSCOPY WITH PROPOFOL;  Surgeon: Lollie Sails, MD;  Location: [facility] ENDOSCOPY;  Service: Endoscopy;  Laterality: N/A;   COLONOSCOPY WITH PROPOFOL N/A 02/04/2018   Procedure: COLONOSCOPY WITH PROPOFOL;  Surgeon: Lollie Sails, MD;  Location: [facility] ENDOSCOPY;  Service: Endoscopy;  Laterality: N/A;   EYE SURGERY Left 2000   Strabismus correction   FOREIGN BODY REMOVAL Left 10/23/2019   Procedure: REMOVAL FOREIGN BODY LEFT HEEL;  Surgeon: Samara Deist, DPM;  Location: [facility] ORS;  Service: Podiatry;  Laterality: Left;   GASTRIC RESTRICTION SURGERY N/A 1982   "stomach stapled for weight loss"   LUMBAR FUSION N/A 02/10/2020   Procedure: PLIF,IP,PSI L2- L3;EXPL FUSION; Location: [facility]; Surgeon: Newman Pies, MD   POSTERIOR LUMBAR FUSION N/A 05/04/2015   Procedure: LUMBAR THREE-FOUR, LUMBAR FOUR-FIVE POSTERIOR LUMBAR FUSION; Location: [facility]; Surgeon: Newman Pies, MD   TONSILLECTOMY Bilateral 1954   TRANSANAL EXCISION OF RECTAL MASS N/A 09/18/2016   Procedure: TRANSANAL EXCISION OF RECTAL TUMOR;  Surgeon: Christene Lye, MD;  Location: [facility] ORS;  Service: General;  Laterality: N/A;   Family  History  Problem Relation Age of Onset   Colon cancer Neg Hx    Breast cancer Neg Hx    Social History   Tobacco Use   Smoking status: Never   Smokeless tobacco: Never  Vaping Use   Vaping Use: Never used  Substance Use Topics   Alcohol use: Yes    Comment: 6 - 12 beers a week   Drug use: No    Pertinent Clinical Results:  LABS: Labs reviewed: Acceptable for surgery.  Hospital  Outpatient Visit on 12/26/2020  Component Date Value Ref Range Status   WBC 12/26/2020 6.8  4.0 - 10.5 K/uL Final   RBC 12/26/2020 5.05  4.22 - 5.81 MIL/uL Final   Hemoglobin 12/26/2020 15.7  13.0 - 17.0 g/dL Final   HCT 12/26/2020 46.6  39.0 - 52.0 % Final   MCV 12/26/2020 92.3  80.0 - 100.0 fL Final   MCH 12/26/2020 31.1  26.0 - 34.0 pg Final   MCHC 12/26/2020 33.7  30.0 - 36.0 g/dL Final   RDW 12/26/2020 12.3  11.5 - 15.5 % Final   Platelets 12/26/2020 172  150 - 400 K/uL Final   nRBC 12/26/2020 0.0  0.0 - 0.2 % Final   Performed at [facility], [address] [place], [place] [address]   Sodium 12/26/2020 130 (A) 135 - 145 mmol/L Final   Potassium 12/26/2020 4.9  3.5 - 5.1 mmol/L Final   Chloride 12/26/2020 95 (A) 98 - 111 mmol/L Final   CO2 12/26/2020 26  22 - 32 mmol/L Final   Glucose, Bld 12/26/2020 252 (A) 70 - 99 mg/dL Final   Glucose reference range applies only to samples taken after fasting for at least 8 hours.   BUN 12/26/2020 24 (A) 8 - 23 mg/dL Final   Creatinine, Ser 12/26/2020 1.25 (A) 0.61 - 1.24 mg/dL Final   Calcium 12/26/2020 8.8 (A) 8.9 - 10.3 mg/dL Final   GFR, Estimated 12/26/2020 >60  >60 mL/min Final   Comment: (NOTE) Calculated using the CKD-EPI Creatinine Equation (2021)    Anion gap 12/26/2020 9  5 - 15 Final   Performed at [facility], [address] [place], [place] [address]    ECG: Date: 12/26/2020 Time ECG obtained: 1052 AM Rate: 86 bpm Rhythm: normal sinus Axis (leads I and aVF): Normal Intervals: PR 162 ms. QRS 96 ms. QTc 452 ms. ST segment and T wave changes: No evidence of acute ST segment elevation or depression Comparison: Similar to previous tracing obtained on 10/23/2019   IMAGING / PROCEDURES: DIAGNOSTIC RADIOGRAPHS FINGER LEFT MINIMUM 2 VIEWS performed on 0 02/19/2021 Moderate degenerative changes to the LEFT [facility] joint Mild degenerative changes to the MCP joint No fractures visualized No lytic lesions  noted No evidence of acute fracture or dislocation There is not appear to be any evidence of acute injury from recent fall  CTA CHEST WITH/WITHOUT CONTRAST performed on 05/17/2020 Unchanged fusiform ascending aortic aneurysm measuring up to 44 mm.  Recommend annual imaging follow-up by CTA or MRA Severe coronary atherosclerotic calcifications Mild hepatic steatosis Similar-appearing sequela of prior granulomatous disease  Impression and Plan:  Calvin Roth has been referred for pre-anesthesia review and clearance prior to him undergoing the planned anesthetic and procedural courses. Available labs, pertinent testing, and imaging results were personally reviewed by me. This patient has been appropriately cleared by cardiology with an overall MODERATE risk of significant perioperative complications.  Based on clinical review performed today (12/27/20), barring any significant acute  changes in the patient's overall condition, it is anticipated that he will be able to proceed with the planned surgical intervention. Any acute changes in clinical condition may necessitate his procedure being postponed and/or cancelled. Patient will meet with anesthesia team (MD and/or CRNA) on the day of his procedure for preoperative evaluation/assessment. Questions regarding anesthetic course will be fielded at that time.   Pre-surgical instructions were reviewed with the patient during his PAT appointment and questions were fielded by PAT clinical staff. Patient was advised that if any questions or concerns arise prior to his procedure then he should return a call to PAT and/or his surgeon's office to discuss.  Honor Loh, MSN, APRN, FNP-C, CEN [facility]  Peri-operative Services Nurse Practitioner Phone: [phone] Fax: [phone] 12/27/20 8:36 AM  NOTE: This note has been prepared using Dragon dictation software. Despite my best ability to proofread, there is always the potential  that unintentional transcriptional errors may still occur from this process.

## 2020-12-26 ENCOUNTER — Encounter
Admission: RE | Admit: 2020-12-26 | Discharge: 2020-12-26 | Disposition: A | Discharge status: Home / Self Care | Payer: Medicare Other | Source: Ambulatory Visit | Attending: Surgery | Admitting: Surgery

## 2020-12-26 ENCOUNTER — Other Ambulatory Visit: Payer: Self-pay

## 2020-12-26 DIAGNOSIS — Z01818 Encounter for other preprocedural examination: Secondary | ICD-10-CM | POA: Insufficient documentation

## 2020-12-26 DIAGNOSIS — I10 Essential (primary) hypertension: Secondary | ICD-10-CM | POA: Diagnosis not present

## 2020-12-26 DIAGNOSIS — E118 Type 2 diabetes mellitus with unspecified complications: Secondary | ICD-10-CM | POA: Insufficient documentation

## 2020-12-26 LAB — CBC
HCT: 46.6 % (ref 39.0–52.0)
Hemoglobin: 15.7 g/dL (ref 13.0–17.0)
MCH: 31.1 pg (ref 26.0–34.0)
MCHC: 33.7 g/dL (ref 30.0–36.0)
MCV: 92.3 fL (ref 80.0–100.0)
Platelets: 172 10*3/uL (ref 150–400)
RBC: 5.05 MIL/uL (ref 4.22–5.81)
RDW: 12.3 % (ref 11.5–15.5)
WBC: 6.8 10*3/uL (ref 4.0–10.5)
nRBC: 0 % (ref 0.0–0.2)

## 2020-12-26 LAB — BASIC METABOLIC PANEL
Anion gap: 9 (ref 5–15)
BUN: 24 mg/dL — ABNORMAL HIGH (ref 8–23)
CO2: 26 mmol/L (ref 22–32)
Calcium: 8.8 mg/dL — ABNORMAL LOW (ref 8.9–10.3)
Chloride: 95 mmol/L — ABNORMAL LOW (ref 98–111)
Creatinine, Ser: 1.25 mg/dL — ABNORMAL HIGH (ref 0.61–1.24)
GFR, Estimated: >60 mL/min (ref >60)
Glucose, Bld: 252 mg/dL — ABNORMAL HIGH (ref 70–99)
Potassium: 4.9 mmol/L (ref 3.5–5.1)
Sodium: 130 mmol/L — ABNORMAL LOW (ref 135–145)

## 2020-12-27 MED ORDER — CEFAZOLIN SODIUM-DEXTROSE 2-4 GM/100ML-% IV SOLN
2.0000 g | INTRAVENOUS | Status: AC
  Administered 2020-12-28: 2 g via INTRAVENOUS

## 2020-12-27 MED ORDER — CHLORHEXIDINE GLUCONATE 0.12 % MT SOLN
15.0000 mL | Freq: Once | OROMUCOSAL | Status: AC
Start: 2020-12-27 — End: 2020-12-28

## 2020-12-27 MED ORDER — ORAL CARE MOUTH RINSE: 15.0000 mL | Freq: Once | OROMUCOSAL | Status: AC

## 2020-12-27 MED ORDER — SODIUM CHLORIDE 0.9 % IV SOLN: INTRAVENOUS | Status: DC

## 2020-12-28 ENCOUNTER — Ambulatory Visit: Payer: Medicare Other | Admitting: Urgent Care

## 2020-12-28 ENCOUNTER — Encounter
Admission: RE | Disposition: A | Discharge status: Home / Self Care | Payer: Self-pay | Source: Home / Self Care | Attending: Surgery

## 2020-12-28 ENCOUNTER — Encounter: Payer: Self-pay | Admitting: Surgery

## 2020-12-28 ENCOUNTER — Ambulatory Visit
Admission: RE | Admit: 2020-12-28 | Discharge: 2020-12-28 | Disposition: A | Discharge status: Home / Self Care | Payer: Medicare Other | Attending: Surgery | Admitting: Surgery

## 2020-12-28 ENCOUNTER — Other Ambulatory Visit: Payer: Self-pay

## 2020-12-28 DIAGNOSIS — I2510 Atherosclerotic heart disease of native coronary artery without angina pectoris: Secondary | ICD-10-CM | POA: Diagnosis not present

## 2020-12-28 DIAGNOSIS — Z79899 Other long term (current) drug therapy: Secondary | ICD-10-CM | POA: Diagnosis not present

## 2020-12-28 DIAGNOSIS — Z794 Long term (current) use of insulin: Secondary | ICD-10-CM | POA: Diagnosis not present

## 2020-12-28 DIAGNOSIS — S5332XA Traumatic rupture of left ulnar collateral ligament, initial encounter: Secondary | ICD-10-CM | POA: Diagnosis present

## 2020-12-28 DIAGNOSIS — I129 Hypertensive chronic kidney disease with stage 1 through stage 4 chronic kidney disease, or unspecified chronic kidney disease: Secondary | ICD-10-CM | POA: Insufficient documentation

## 2020-12-28 DIAGNOSIS — I712 Thoracic aortic aneurysm, without rupture: Secondary | ICD-10-CM | POA: Diagnosis not present

## 2020-12-28 DIAGNOSIS — E1122 Type 2 diabetes mellitus with diabetic chronic kidney disease: Secondary | ICD-10-CM | POA: Diagnosis not present

## 2020-12-28 DIAGNOSIS — N183 Chronic kidney disease, stage 3 unspecified: Secondary | ICD-10-CM | POA: Insufficient documentation

## 2020-12-28 DIAGNOSIS — M5136 Other intervertebral disc degeneration, lumbar region: Secondary | ICD-10-CM | POA: Insufficient documentation

## 2020-12-28 DIAGNOSIS — Z7984 Long term (current) use of oral hypoglycemic drugs: Secondary | ICD-10-CM | POA: Insufficient documentation

## 2020-12-28 DIAGNOSIS — X58XXXA Exposure to other specified factors, initial encounter: Secondary | ICD-10-CM | POA: Diagnosis not present

## 2020-12-28 DIAGNOSIS — Z9884 Bariatric surgery status: Secondary | ICD-10-CM | POA: Insufficient documentation

## 2020-12-28 DIAGNOSIS — E785 Hyperlipidemia, unspecified: Secondary | ICD-10-CM | POA: Insufficient documentation

## 2020-12-28 DIAGNOSIS — I700 Atherosclerosis of aorta: Secondary | ICD-10-CM | POA: Insufficient documentation

## 2020-12-28 DIAGNOSIS — K219 Gastro-esophageal reflux disease without esophagitis: Secondary | ICD-10-CM | POA: Insufficient documentation

## 2020-12-28 HISTORY — DX: Unspecified osteoarthritis, unspecified site: M19.90

## 2020-12-28 HISTORY — DX: Type 2 diabetes mellitus without complications: E11.9

## 2020-12-28 HISTORY — DX: Other intervertebral disc degeneration, lumbar region: M51.36

## 2020-12-28 HISTORY — DX: Neuromuscular dysfunction of bladder, unspecified: N31.9

## 2020-12-28 HISTORY — DX: Atherosclerosis of aorta: I70.0

## 2020-12-28 HISTORY — DX: Diplopia: H53.2

## 2020-12-28 HISTORY — DX: Fatty (change of) liver, not elsewhere classified: K76.0

## 2020-12-28 HISTORY — DX: Intermittent alternating exotropia: H50.34

## 2020-12-28 HISTORY — DX: Benign prostatic hyperplasia without lower urinary tract symptoms: N40.0

## 2020-12-28 HISTORY — DX: Diaphragmatic hernia without obstruction or gangrene: K44.9

## 2020-12-28 HISTORY — DX: Other intervertebral disc degeneration, lumbar region without mention of lumbar back pain or lower extremity pain: M51.369

## 2020-12-28 HISTORY — DX: Chronic kidney disease, stage 3 unspecified: N18.30

## 2020-12-28 HISTORY — PX: ULNAR COLLATERAL LIGAMENT REPAIR: SHX6159

## 2020-12-28 LAB — GLUCOSE, CAPILLARY
Glucose-Capillary: 213 mg/dL — ABNORMAL HIGH (ref 70–99)
Glucose-Capillary: 224 mg/dL — ABNORMAL HIGH (ref 70–99)

## 2020-12-28 SURGERY — REPAIR, LIGAMENT, ULNAR COLLATERAL
Anesthesia: General | Site: Thumb | Laterality: Left

## 2020-12-28 MED ORDER — LIDOCAINE HCL 1 % IJ SOLN: INTRAMUSCULAR | Status: DC | PRN

## 2020-12-28 MED ORDER — BUPIVACAINE HCL (PF) 0.5 % IJ SOLN
INTRAMUSCULAR | Status: AC
  Filled 2020-12-28: qty 30

## 2020-12-28 MED ORDER — FENTANYL CITRATE (PF) 100 MCG/2ML IJ SOLN
INTRAMUSCULAR | Status: DC | PRN
  Administered 2020-12-28 (×2): 25 ug via INTRAVENOUS

## 2020-12-28 MED ORDER — FENTANYL CITRATE (PF) 100 MCG/2ML IJ SOLN
INTRAMUSCULAR | Status: AC
  Filled 2020-12-28: qty 2

## 2020-12-28 MED ORDER — EPHEDRINE 5 MG/ML INJ
INTRAVENOUS | Status: AC
  Filled 2020-12-28: qty 5

## 2020-12-28 MED ORDER — ONDANSETRON HCL 4 MG PO TABS: 4.0000 mg | ORAL_TABLET | Freq: Four times a day (QID) | ORAL | Status: DC | PRN

## 2020-12-28 MED ORDER — PHENYLEPHRINE HCL (PRESSORS) 10 MG/ML IV SOLN
INTRAVENOUS | Status: DC | PRN
Start: 2020-12-28 — End: 2020-12-28
  Administered 2020-12-28: 200 ug via INTRAVENOUS
  Administered 2020-12-28 (×2): 100 ug via INTRAVENOUS
  Administered 2020-12-28 (×4): 200 ug via INTRAVENOUS
  Administered 2020-12-28: 100 ug via INTRAVENOUS
  Administered 2020-12-28 (×4): 200 ug via INTRAVENOUS

## 2020-12-28 MED ORDER — LIDOCAINE HCL (PF) 1 % IJ SOLN
INTRAMUSCULAR | Status: AC
  Filled 2020-12-28: qty 30

## 2020-12-28 MED ORDER — ONDANSETRON HCL 4 MG/2ML IJ SOLN: 4.0000 mg | Freq: Four times a day (QID) | INTRAMUSCULAR | Status: DC | PRN

## 2020-12-28 MED ORDER — HYDROCODONE-ACETAMINOPHEN 5-325 MG PO TABS: 1.0000 | ORAL_TABLET | Freq: Four times a day (QID) | ORAL | 0 refills | Status: DC | PRN

## 2020-12-28 MED ORDER — POTASSIUM CHLORIDE IN NACL 20-0.9 MEQ/L-% IV SOLN: INTRAVENOUS | Status: DC

## 2020-12-28 MED ORDER — EPHEDRINE SULFATE 50 MG/ML IJ SOLN
INTRAMUSCULAR | Status: DC | PRN
  Administered 2020-12-28: 10 mg via INTRAVENOUS
  Administered 2020-12-28 (×3): 5 mg via INTRAVENOUS

## 2020-12-28 MED ORDER — MIDAZOLAM HCL 2 MG/2ML IJ SOLN
INTRAMUSCULAR | Status: DC | PRN
Start: 2020-12-28 — End: 2020-12-28
  Administered 2020-12-28: 1 mg via INTRAVENOUS

## 2020-12-28 MED ORDER — CEFAZOLIN SODIUM-DEXTROSE 2-4 GM/100ML-% IV SOLN
INTRAVENOUS | Status: AC
  Filled 2020-12-28: qty 100

## 2020-12-28 MED ORDER — FENTANYL CITRATE (PF) 100 MCG/2ML IJ SOLN: 25.0000 ug | INTRAMUSCULAR | Status: DC | PRN

## 2020-12-28 MED ORDER — CHLORHEXIDINE GLUCONATE 0.12 % MT SOLN
OROMUCOSAL | Status: AC
  Administered 2020-12-28: 15 mL via OROMUCOSAL
  Filled 2020-12-28: qty 15

## 2020-12-28 MED ORDER — METOCLOPRAMIDE HCL 5 MG/ML IJ SOLN: 5.0000 mg | Freq: Three times a day (TID) | INTRAMUSCULAR | Status: DC | PRN

## 2020-12-28 MED ORDER — BUPIVACAINE HCL (PF) 0.5 % IJ SOLN
INTRAMUSCULAR | Status: DC | PRN
  Administered 2020-12-28: 10 mL

## 2020-12-28 MED ORDER — OXYCODONE HCL 5 MG PO TABS
ORAL_TABLET | ORAL | Status: AC
  Filled 2020-12-28: qty 1

## 2020-12-28 MED ORDER — PROPOFOL 10 MG/ML IV BOLUS
INTRAVENOUS | Status: AC
  Filled 2020-12-28: qty 20

## 2020-12-28 MED ORDER — MIDAZOLAM HCL 2 MG/2ML IJ SOLN
INTRAMUSCULAR | Status: AC
  Filled 2020-12-28: qty 2

## 2020-12-28 MED ORDER — DEXAMETHASONE SODIUM PHOSPHATE 10 MG/ML IJ SOLN
INTRAMUSCULAR | Status: DC | PRN
  Administered 2020-12-28: 5 mg via INTRAVENOUS

## 2020-12-28 MED ORDER — LIDOCAINE HCL (CARDIAC) PF 100 MG/5ML IV SOSY
PREFILLED_SYRINGE | INTRAVENOUS | Status: DC | PRN
  Administered 2020-12-28: 50 mg via INTRAVENOUS

## 2020-12-28 MED ORDER — METOCLOPRAMIDE HCL 10 MG PO TABS: 5.0000 mg | ORAL_TABLET | Freq: Three times a day (TID) | ORAL | Status: DC | PRN

## 2020-12-28 MED ORDER — OXYCODONE HCL 5 MG PO TABS
5.0000 mg | ORAL_TABLET | Freq: Once | ORAL | Status: AC | PRN
  Administered 2020-12-28: 5 mg via ORAL

## 2020-12-28 MED ORDER — HYDROCODONE-ACETAMINOPHEN 5-325 MG PO TABS: 1.0000 | ORAL_TABLET | ORAL | Status: DC | PRN

## 2020-12-28 MED ORDER — ONDANSETRON HCL 4 MG/2ML IJ SOLN
INTRAMUSCULAR | Status: DC | PRN
  Administered 2020-12-28: 4 mg via INTRAVENOUS

## 2020-12-28 MED ORDER — OXYCODONE HCL 5 MG/5ML PO SOLN
5.0000 mg | Freq: Once | ORAL | Status: AC | PRN
Start: 2020-12-28 — End: 2020-12-28

## 2020-12-28 MED ORDER — PROPOFOL 10 MG/ML IV BOLUS
INTRAVENOUS | Status: DC | PRN
  Administered 2020-12-28: 30 mg via INTRAVENOUS
  Administered 2020-12-28: 150 mg via INTRAVENOUS
  Administered 2020-12-28: 20 mg via INTRAVENOUS

## 2020-12-28 SURGICAL SUPPLY — 52 items
ANCHOR REPAIR HAND WRIST (Orthopedic Implant) ×1 IMPLANT
APL PRP STRL LF DISP 70% ISPRP (MISCELLANEOUS) ×1
BLADE OSC/SAGITTAL 5.5X25 (BLADE) ×2 IMPLANT
BNDG COHESIVE 4X5 TAN ST LF (GAUZE/BANDAGES/DRESSINGS) ×2 IMPLANT
BNDG CONFORM 3 STRL LF (GAUZE/BANDAGES/DRESSINGS) ×2 IMPLANT
BNDG ELASTIC 3X5.8 VLCR STR LF (GAUZE/BANDAGES/DRESSINGS) ×1 IMPLANT
BNDG ESMARK 4X12 TAN STRL LF (GAUZE/BANDAGES/DRESSINGS) ×2 IMPLANT
BUR 4X55 1 (BURR) ×2 IMPLANT
BUR SURG RND 3 LG (BURR) ×2 IMPLANT
CAST PADDING 2X4YD ST 30245 (MISCELLANEOUS) ×1
CHLORAPREP W/TINT 26 (MISCELLANEOUS) ×2 IMPLANT
CORD BIP STRL DISP 12FT (MISCELLANEOUS) IMPLANT
CUFF TOURN SGL QUICK 18X4 (TOURNIQUET CUFF) IMPLANT
DRAPE FLUOR MINI C-ARM 54X84 (DRAPES) ×2 IMPLANT
FORCEPS JEWEL BIP 4-3/4 STR (INSTRUMENTS) IMPLANT
GAUZE 4X4 16PLY ~~LOC~~+RFID DBL (SPONGE) ×2 IMPLANT
GAUZE SPONGE 4X4 12PLY STRL (GAUZE/BANDAGES/DRESSINGS) ×2 IMPLANT
GAUZE XEROFORM 1X8 LF (GAUZE/BANDAGES/DRESSINGS) ×2 IMPLANT
GLOVE SURG ENC MOIS LTX SZ8 (GLOVE) ×4 IMPLANT
GLOVE SURG UNDER LTX SZ8 (GLOVE) ×2 IMPLANT
GOWN STRL REUS W/ TWL LRG LVL3 (GOWN DISPOSABLE) ×1 IMPLANT
GOWN STRL REUS W/ TWL XL LVL3 (GOWN DISPOSABLE) ×1 IMPLANT
GOWN STRL REUS W/TWL LRG LVL3 (GOWN DISPOSABLE) ×2
GOWN STRL REUS W/TWL XL LVL3 (GOWN DISPOSABLE) ×2
KIT TURNOVER KIT A (KITS) ×2 IMPLANT
MANIFOLD NEPTUNE II (INSTRUMENTS) ×2 IMPLANT
NDL HYPO 25X1 1.5 SAFETY (NEEDLE) ×1 IMPLANT
NEEDLE HYPO 25X1 1.5 SAFETY (NEEDLE) ×2 IMPLANT
NS IRRIG 500ML POUR BTL (IV SOLUTION) ×2 IMPLANT
PACK EXTREMITY ARMC (MISCELLANEOUS) ×2 IMPLANT
PADDING CAST COTTON 2X4 ST (MISCELLANEOUS) IMPLANT
PASSER SUT SWANSON 36MM LOOP (INSTRUMENTS) ×2 IMPLANT
SPLINT WRIST LG RT TX900304 (SOFTGOODS) IMPLANT
SPLINT WRIST M LT TX990308 (SOFTGOODS) ×2 IMPLANT
STOCKINETTE 48X4 2 PLY STRL (GAUZE/BANDAGES/DRESSINGS) ×1 IMPLANT
STOCKINETTE IMPERV 14X48 (MISCELLANEOUS) ×2 IMPLANT
STOCKINETTE STRL 4IN 9604848 (GAUZE/BANDAGES/DRESSINGS) ×2 IMPLANT
STRIP CLOSURE SKIN 1/4X4 (GAUZE/BANDAGES/DRESSINGS) ×1 IMPLANT
SUT ETHIBOND 3-0  EXTR (SUTURE)
SUT ETHIBOND 3-0 EXTR (SUTURE) IMPLANT
SUT PROLENE 1 CT (SUTURE) IMPLANT
SUT PROLENE 4 0 PS 2 18 (SUTURE) IMPLANT
SUT VIC AB 0 CT2 27 (SUTURE) IMPLANT
SUT VIC AB 2-0 SH 27 (SUTURE)
SUT VIC AB 2-0 SH 27XBRD (SUTURE) IMPLANT
SUT VICRYL 2-0 SH 8X27 (SUTURE) ×1 IMPLANT
SUT VICRYL 5-0 (SUTURE) IMPLANT
SUT VICRYL+ 3-0 27IN RB-1 (SUTURE) IMPLANT
SWABSTK COMLB BENZOIN TINCTURE (MISCELLANEOUS) ×1 IMPLANT
SYR 10ML LL (SYRINGE) ×2 IMPLANT
WATER STERILE IRR 500ML POUR (IV SOLUTION) ×2 IMPLANT
WIRE Z .062 C-WIRE SPADE TIP (WIRE) ×2 IMPLANT

## 2020-12-28 NOTE — Transfer of Care (Signed)
Immediate Anesthesia Transfer of Care Note  Patient: FARZAD TIBBETTS  Procedure(s) Performed: LEFT THUMB ULNAR COLLATERAL LIGAMENT REPAIR/RECONSTRUCTION (Left: Thumb)  Patient Location: PACU  Anesthesia Type:General  Level of Consciousness: awake  Airway & Oxygen Therapy: Patient Spontanous Breathing  Post-op Assessment: Report given to RN and Post -op Vital signs reviewed and stable  Post vital signs: Reviewed and stable  Last Vitals:  Vitals Value Taken Time  BP 118/79 12/28/20 1248  Temp    Pulse 83 12/28/20 1250  Resp 15 12/28/20 1250  SpO2 95 % 12/28/20 1250  Vitals shown include unvalidated device data.  Last Pain:  Vitals:   12/28/20 0958  TempSrc: Tympanic  PainSc: 0-No pain      Patients Stated Pain Goal: 0 (82/57/49 3552)  Complications: No notable events documented.

## 2020-12-28 NOTE — Op Note (Signed)
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  Post-Op Diagnosis:   Same  Procedure:   Primary repair of ulnar collateral ligament with internal brace augmentation, left thumb.  Surgeon:   Pascal Lux, MD  Assistant:   Rennis Chris, PA-S  Anesthesia:   General LMA  Findings:   As above.  Complications:   None  EBL:   0 cc  Fluids:   1000 cc crystalloid  TT:   50 minutes at 250 mmHg  Drains:   None  Closure:   3-0 Vicryl subcuticular sutures.  Implants:   3.5 mm Arthrex SwiveLocks 2  Brief Clinical Note:   The patient is a 75 year old male who sustained above-noted injury 1 year ago when he hurt his thumb while reaching out to try to stop himself from falling after missing the last step of a staircase. He continues to note pain and weakness of the thumb, especially with grasping activities. His symptoms have persisted despite medications, activity modification, etc. The patient's history and examination are consistent with a chronic disruption of the ulnar collateral ligament of the left thumb MCP joint. The patient presents at this time for repair of the ulnar collateral ligament of the left thumb.  Procedure:   The patient was brought into the operating room and lain in the supine position. After adequate general laryngeal mask anesthesia was obtained, the patient's left hand and upper extremity were prepped with ChloraPrep solution before being draped sterilely. Preoperative antibiotics were administered. A timeout was performed to verify the appropriate surgical site before the limb was exsanguinated with an Esmarch and the tourniquet inflated to 250 mmHg.   An approximately 2.5-3 cm incision was made over the ulnar aspect of the thumb MCP joint. The incision was carried down through the subcutaneous tissues with care taken to protect any neurovascular structures. The adductor hood  was released from proximal to distal sufficiently to permit visualization of the ulnar aspect of the thumb MCP joint. Careful dissection was carried out along the ulnar side of the base of the proximal phalanx as well as over the ulnar side of the metacarpal head just proximal to the ulnar collateral ligament origin.  The ulnar collateral ligament remnant was identified.  The proper ligament appeared to have avulsed from its attachment on the base of the thumb proximal phalanx, but the oblique portion of the ligament appear to still be intact.   A 1.35 mm guidewire was inserted in the dorsal ulnar aspect of the metacarpal head at the origin of the UCL insertion site. A second guidewire was placed in the volar ulnar aspect of the base of the proximal phalanx. Each of these guidewires were overreamed with a 3.2 mm cannulated drill. A 3.5 mm Arthrex SwiveLock anchor was inserted distally after loading a 2-0 Ethibond suture and a 1.5 mm FiberTape. The Ethibond suture was passed through the ulnar collateral ligament and tied securely to effect the ulnar collateral ligament repair. A second 3.5 mm Arthrex SwiveLock anchor was placed proximally after loading both ends of the 1.5 mm FiberTape. With the thumb MCP joint flexed to 30, the anchor was inserted and tightened securely to reinforce the primary repair of the ulnar collateral ligament. Gentle stretching of the repair demonstrated excellent stability. A 2-0 Vicryl suture was used to repair the proximal fibers of the adductor fluid.   The wound was thoroughly irrigated with sterile  saline solution using bulb irrigation before the skin was closed using 2-0 Vicryl subcuticular sutures. Benzoin and Steri-Strips are applied to the skin before a thumb spica splint was applied to the thumb and forearm, maintaining the wrist in neutral position and the thumb in slight abduction. The patient was then awakened, extubated, and returned to the recovery room in satisfactory  condition after tolerating the procedure well.

## 2020-12-28 NOTE — H&P (Signed)
History of Present Illness: Calvin Roth is a 75 y.o. male who presents today for his history and physical for his upcoming left thumb UCL repair/reconstruction. Surgery scheduled with Dr. Roland Rack on 12/28/2020. Overall the patient feels that he is doing well. He denies any recent trauma or injury affecting the left hand. He denies any significant pain in the left hand however he does still have difficulty when trying to grab and lift objects with his left hand due to weakness on the thumb. He denies any catching or locking symptoms in the left upper extremity. Denies any numbness or tingling. He denies any personal history of heart attack, stroke, asthma or COPD. The patient denies any history of blood clots.  Past Medical History:  Diabetes mellitus type 2, uncomplicated (CMS-HCC)   Hyperlipidemia   Hypertension   Serrated adenoma of colon, unspecified 07/17/2016   Past Surgical History:  back surgery 02/10/2020 - fused L2 and L3   CATARACT EXTRACTION Left 05/2018   CATARACT EXTRACTION W/ INTRAOCULAR LENS IMPLANT Right 04/2018   CHOLECYSTECTOMY   COLONOSCOPY 04/15/2004, 07/13/2009   COLONOSCOPY 07/17/2016 (Serrated adenoma/Repeat 88yr/MUS)   COLONOSCOPY 02/04/2018 (Tubular adenoma of the colon/Hyperplastic colon polyp/Repeat 5yrs/MUS)   Correction of strabismus, left eye   EXPLORATION OF SPINAL FUSION - spinal fusion 05/2015   Gastric stapling   LENS EYE SURGERY Right 04/03/2018 (standard monofocal)   LENS EYE SURGERY Left 05/15/2018   TONSILLECTOMY   Past Family History:  Stroke Mother   High blood pressure (Hypertension) Father   Colon polyps Brother   Diabetes type II Other   Medications:  atorvastatin (LIPITOR) 10 MG tablet Take 1 tablet (10 mg total) by mouth once daily 90 tablet 3   carvediloL (COREG) 6.25 MG tablet Take 1 tablet (6.25 mg total) by mouth 2 (two) times daily with meals 60 tablet 11   dorzolamide-timoloL (COSOPT) 22.3-6.8 mg/mL ophthalmic solution INSTILL 1  DROP IN RIGHT EYE TWICE DAILY 10 mL 6   gabapentin (NEURONTIN) 400 MG capsule TAKE 1 CAPSULE(400 MG) BY MOUTH THREE TIMES DAILY 270 capsule 3   insulin DETEMIR (LEVEMIR FLEXTOUCH) pen injector (concentration 100 units/mL) Inject 56 Units subcutaneously once daily 51 mL 3   losartan-hydrochlorothiazide (HYZAAR) 100-12.5 mg tablet Take 1 tablet by mouth once daily 90 tablet 3   metFORMIN (GLUCOPHAGE-XR) 500 MG XR tablet TAKE 4 TABLETS(2000 MG) BY MOUTH DAILY WITH DINNER 360 tablet 1   omeprazole (PRILOSEC) 20 MG DR capsule TAKE 1 CAPSULE(20 MG) BY MOUTH EVERY DAY 90 capsule 1   pen needle, diabetic 31 gauge x 5/16" needle Use as directed 100 each 12   sulfamethoxazole-trimethoprim (BACTRIM DS) 800-160 mg tablet Take 1 tablet (160 mg of trimethoprim total) by mouth 2 (two) times daily for 10 days 20 tablet 0   tamsulosin (FLOMAX) 0.4 mg capsule Take 1 capsule (0.4 mg total) by mouth once daily Take 30 minutes after same meal each day. 30 capsule 11   Allergies: No Known Allergies   Review of Systems:  A comprehensive 14 point ROS was performed, reviewed by me today, and the pertinent orthopaedic findings are documented in the HPI.  Physical Exam: BP 122/82  Ht 180.3 cm (5\' 11" )  Wt (!) 117.8 kg (259 lb 12.8 oz)  BMI 36.23 kg/m  General/Constitutional: The patient appears to be well-nourished, well-developed, and in no acute distress. Neuro/Psych: Normal mood and affect, oriented to person, place and time. Eyes: Non-icteric. Pupils are equal, round, and reactive to light, and exhibit synchronous movement.  ENT: Unremarkable. Lymphatic: No palpable adenopathy. Respiratory: Lungs clear to auscultation, Normal chest excursion, No wheezes and Non-labored breathing Cardiovascular: Regular rate and rhythm. No murmurs. and No edema, swelling or tenderness, except as noted in detailed exam. Integumentary: No impressive skin lesions present, except as noted in detailed exam. Musculoskeletal:  Unremarkable, except as noted in detailed exam.  General: Well developed, well nourished 76 y.o. male in no apparent distress. Normal affect. Normal communication. Patient answers questions appropriately. The patient has a normal gait. There is no antalgic component. There is no hip lurch.   Left Upper Extremity: Examination of the left hand revealed no bony abnormality, no ecchymosis, and no edema. The patient does have full left wrist extension, flexion 60 degrees. The patient has no pain with gentle wrist flexion or extension exercises. No pain with supination or pronation of the left wrist. No pain with radial or ulnar deviation of the left hand. The patient is nontender palpation over the distal radius or ulna. Nontender palpation over the [facility] joint, the patient has a negative grind test. He is tender to palpation over the ulnar aspect of the MCP joint. Noticeable laxity with ulnar stress testing of the left thumb in comparison to the right thumb at today's visit. A endpoint is visualized however. There was no instability of the wrist or hand. There is no nodularity or cysts noted. There was no triggering of the digits. The patient had a negative Finkelstein test. The patient had no snuffbox tenderness. The patient had full composite fist. There was no angulation or rotation of the digits.   Neurologic: The patient had sensation that was intact to light touch. The patient had full motor strength. There was no tremor or clonus noted.   Vascular: The patient had good skin warmth. The radial and ulnar pulse were normal and intact. The patient had less than 2 second capillary refill.   Imaging: AP, lateral oblique images of the left thumb were obtained today in the office and reviewed by me. These x-rays do demonstrate moderate degenerative changes to the left [facility] joint. Mild degenerative changes to the MCP joint. No acute fractures visualized. No lytic lesions noted. No evidence of acute fracture or  dislocation.  AP, lateral and oblique images of the left wrist in addition to scaphoid views were obtained today in the office and reviewed by me. These x-rays demonstrate arthritic changes noted to the left wrist with decreased space between the lunate and distal radius. Increased CMC joint arthritic changes. The DRUJ does not appear to have any significant loss of space. There is no acute scaphoid fracture visualized. No evidence of avascular necrosis. No evidence of dislocation.  Impression: Complete tear of ulnar collateral ligament of interphalangeal joint of Left thumb.  Plan:  1. Treatment options were discussed today with the patient. 2. The patient is scheduled for a left thumb UCL repair/reconstruction with Dr. Roland Rack on 12/28/2020. 3. The patient was instructed on the risk and benefits of surgery at this time. 4. This document will serve as a surgical history and physical for the patient. 5. He will follow-up per standard postop protocol.  The procedure was discussed with the patient, as were the potential risks (including bleeding, infection, nerve and/or blood vessel injury, persistent or recurrent pain, failure of the repair, progression of arthritis, need for further surgery, blood clots, strokes, heart attacks and/or arhythmias, pneumonia, etc.) and benefits. The patient states his understanding and wishes to proceed.    H&P reviewed and patient re-examined.  No changes.

## 2020-12-28 NOTE — Anesthesia Postprocedure Evaluation (Signed)
Anesthesia Post Note  Patient: Calvin Roth  Procedure(s) Performed: LEFT THUMB ULNAR COLLATERAL LIGAMENT REPAIR/RECONSTRUCTION (Left: Thumb)  Patient location during evaluation: PACU Anesthesia Type: General Level of consciousness: awake and alert Pain management: pain level controlled Vital Signs Assessment: post-procedure vital signs reviewed and stable Respiratory status: spontaneous breathing, nonlabored ventilation, respiratory function stable and patient connected to nasal cannula oxygen Cardiovascular status: blood pressure returned to baseline and stable Postop Assessment: no apparent nausea or vomiting Anesthetic complications: no   No notable events documented.   Last Vitals:  Vitals:   12/28/20 1329 12/28/20 1339  BP: 129/79 (!) 155/89  Pulse: 82 83  Resp: 15 16  Temp: (!) 36 C (!) 36.1 C  SpO2: 94% 96%    Last Pain:  Vitals:   12/28/20 1339  TempSrc: Axillary  PainSc: 3                  Precious Haws Grabiel Schmutz

## 2020-12-28 NOTE — Anesthesia Procedure Notes (Signed)
Procedure Name: LMA Insertion Date/Time: 12/28/2020 11:35 AM Performed by: Biagio Borg, CRNA Pre-anesthesia Checklist: Patient identified, Emergency Drugs available, Suction available and Patient being monitored Patient Re-evaluated:Patient Re-evaluated prior to induction Oxygen Delivery Method: Circle system utilized Preoxygenation: Pre-oxygenation with 100% oxygen Induction Type: IV induction Ventilation: Mask ventilation without difficulty LMA: LMA inserted LMA Size: 5.0 Tube type: Oral Number of attempts: 1 Placement Confirmation: ETT inserted through vocal cords under direct vision, positive ETCO2 and breath sounds checked- equal and bilateral Tube secured with: Tape Dental Injury: Teeth and Oropharynx as per pre-operative assessment

## 2020-12-28 NOTE — Discharge Instructions (Addendum)
Orthopedic discharge instructions: Keep splint dry and intact. Keep hand elevated above heart level. Apply ice to affected area frequently. Take ibuprofen 600-800 mg TID with meals for 5-7 days, then as necessary. Take pain medication as prescribed or ES Tylenol when needed.  Return for follow-up in 10-14 days or as scheduled.AMBULATORY SURGERY  DISCHARGE INSTRUCTIONS   The drugs that you were given will stay in your system until tomorrow so for the next 24 hours you should not:  Drive an automobile Make any legal decisions Drink any alcoholic beverage   You may resume regular meals tomorrow.  Today it is better to start with liquids and gradually work up to solid foods.  You may eat anything you prefer, but it is better to start with liquids, then soup and crackers, and gradually work up to solid foods.   Please notify your doctor immediately if you have any unusual bleeding, trouble breathing, redness and pain at the surgery site, drainage, fever, or pain not relieved by medication.    Your post-operative visit with Dr.                                       is: Date:                        Time:    Please call to schedule your post-operative visit.  Additional Instructions: 

## 2020-12-28 NOTE — Anesthesia Preprocedure Evaluation (Signed)
Anesthesia Evaluation  Patient identified by MRN, date of birth, ID band Patient awake    Reviewed: Allergy & Precautions, NPO status , Patient's Chart, lab work & pertinent test results  History of Anesthesia Complications Negative for: history of anesthetic complications  Airway Mallampati: III  TM Distance: >3 FB Neck ROM: full    Dental  (+) Chipped   Pulmonary neg pulmonary ROS, neg shortness of breath,    Pulmonary exam normal        Cardiovascular hypertension, (-) angina+ CAD  (-) DOE Normal cardiovascular exam     Neuro/Psych negative neurological ROS  negative psych ROS   GI/Hepatic Neg liver ROS, hiatal hernia, GERD  Medicated and Controlled,  Endo/Other  diabetes, Type 2  Renal/GU Renal disease     Musculoskeletal  (+) Arthritis ,   Abdominal   Peds  Hematology negative hematology ROS (+)   Anesthesia Other Findings Past Medical History: No date: Actinic keratosis No date: Aortic atherosclerosis ([place]) 03/12/2019: Aortic root dilatation (HCC)     Comment:  a.) CTA 03/22/2019 --> 4.5 cm 10/31/2017: Ascending aortic aneurysm (HCC)     Comment:  a.) fusiform; measured 4.4 cm No date: BPH (benign prostatic hyperplasia) No date: CKD (chronic kidney disease), stage III (HCC) No date: Coronary artery disease No date: DDD (degenerative disc disease), lumbar 05/2020: Diabetic macular edema (HCC) No date: Diplopia 08/01/2006: Dysplastic nevus     Comment:  Right medial pectoral. Moderate to marked atypia, edge               involved. Excised 09/12/2006, margins free. 11/19/2007: Dysplastic nevus     Comment:  Left costal margin/infrapectoral. Slight to moderate               atypia, margin focally involved. 11/19/2007: Dysplastic nevus     Comment:  Right lateral flank near waistline. Moderate atypia,               extends to one edge.  05/25/2019: Dysplastic nevus     Comment:  Left lateral tricep.  Moderate atypia, deep margin               involved.  No date: GERD (gastroesophageal reflux disease) No date: Hepatic steatosis No date: Hyperlipidemia No date: Hypertension No date: Intermittent alternating exotropia No date: Neurogenic bladder     Comment:  a.) self catheriterizes FOUR times daily No date: Osteoarthritis 07/17/2016: Serrated adenoma of colon No date: Sliding hiatal hernia No date: T2DM (type 2 diabetes mellitus) ([place])  Past Surgical History: 05/15/2018: CATARACT EXTRACTION; Left 04/03/2018: CATARACT EXTRACTION; Right No date: CHOLECYSTECTOMY 07/17/2016: COLONOSCOPY WITH PROPOFOL; N/A     Comment:  Procedure: COLONOSCOPY WITH PROPOFOL;  Surgeon: Lollie Sails, MD;  Location: [facility] ENDOSCOPY;  Service:               Endoscopy;  Laterality: N/A; 02/04/2018: COLONOSCOPY WITH PROPOFOL; N/A     Comment:  Procedure: COLONOSCOPY WITH PROPOFOL;  Surgeon:               Lollie Sails, MD;  Location: [facility] ENDOSCOPY;                Service: Endoscopy;  Laterality: N/A; 2000: EYE SURGERY; Left     Comment:  Strabismus correction 10/23/2019: FOREIGN BODY REMOVAL; Left     Comment:  Procedure: REMOVAL FOREIGN BODY LEFT HEEL;  Surgeon:  Samara Deist, DPM;  Location: [facility] ORS;  Service:               Podiatry;  Laterality: Left; 1982: GASTRIC RESTRICTION SURGERY; N/A     Comment:  "stomach stapled for weight loss" 02/10/2020: LUMBAR FUSION; N/A     Comment:  Procedure: PLIF,IP,PSI L2- L3;EXPL FUSION; Location:               [facility]; Surgeon: Newman Pies, MD 05/04/2015: POSTERIOR LUMBAR FUSION; N/A     Comment:  Procedure: LUMBAR THREE-FOUR, LUMBAR FOUR-FIVE POSTERIOR              LUMBAR FUSION; Location: [facility]; Surgeon:               Newman Pies, MD 1954: TONSILLECTOMY; Bilateral 09/18/2016: TRANSANAL EXCISION OF RECTAL MASS; N/A     Comment:  Procedure: TRANSANAL EXCISION OF RECTAL TUMOR;  Surgeon:               Christene Lye, MD;  Location: [facility] ORS;                Service: General;  Laterality: N/A;  BMI    Body Mass Index: 36.26 kg/m      Reproductive/Obstetrics negative OB ROS                             Anesthesia Physical Anesthesia Plan  ASA: 3  Anesthesia Plan: General LMA   Post-op Pain Management:    Induction: Intravenous  PONV Risk Score and Plan: Dexamethasone, Ondansetron, Midazolam and Treatment may vary due to age or medical condition  Airway Management Planned: LMA  Additional Equipment:   Intra-op Plan:   Post-operative Plan: Extubation in OR  Informed Consent: I have reviewed the patients History and Physical, chart, labs and discussed the procedure including the risks, benefits and alternatives for the proposed anesthesia with the patient or authorized representative who has indicated his/her understanding and acceptance.     Dental Advisory Given  Plan Discussed with: Anesthesiologist, CRNA and Surgeon  Anesthesia Plan Comments: (Patient consented for risks of anesthesia including but not limited to:  - adverse reactions to medications - damage to eyes, teeth, lips or other oral mucosa - nerve damage due to positioning  - sore throat or hoarseness - Damage to heart, brain, nerves, lungs, other parts of body or loss of life  Patient voiced understanding.)        Anesthesia Quick Evaluation

## 2021-01-05 ENCOUNTER — Encounter: Payer: Self-pay | Admitting: Surgery

## 2021-01-05 MED ORDER — 0.9 % SODIUM CHLORIDE (POUR BTL) OPTIME
TOPICAL | Status: DC | PRN
  Administered 2020-12-28: 350 mL

## 2021-02-27 ENCOUNTER — Ambulatory Visit: Payer: Medicare Other | Admitting: Occupational Therapy

## 2021-03-03 ENCOUNTER — Other Ambulatory Visit: Payer: Self-pay

## 2021-03-03 ENCOUNTER — Encounter: Payer: Self-pay | Admitting: Occupational Therapy

## 2021-03-03 ENCOUNTER — Ambulatory Visit: Payer: Medicare Other | Attending: Surgery | Admitting: Occupational Therapy

## 2021-03-03 DIAGNOSIS — M6281 Muscle weakness (generalized): Secondary | ICD-10-CM | POA: Insufficient documentation

## 2021-03-03 DIAGNOSIS — M25642 Stiffness of left hand, not elsewhere classified: Secondary | ICD-10-CM | POA: Insufficient documentation

## 2021-03-03 DIAGNOSIS — M79642 Pain in left hand: Secondary | ICD-10-CM | POA: Diagnosis not present

## 2021-03-03 NOTE — Therapy (Signed)
[facility] [address], [place], [address] Phone: [phone]   Fax:  [phone]  Occupational Therapy Evaluation  Patient Details  Name: Calvin Roth MRN: [id number] Date of Birth: 1945-06-09 Referring Provider (OT): Dr Roland Rack   Encounter Date: 03/03/2021   OT End of Session - 03/03/21 1348     Visit Number 1    Number of Visits 6    Date for OT Re-Evaluation 04/14/21    OT Start Time 1145    OT Stop Time 1233    OT Time Calculation (min) 48 min    Activity Tolerance Patient tolerated treatment well    Behavior During Therapy [facility] for tasks assessed/performed             Past Medical History:  Diagnosis Date   Actinic keratosis    Aortic atherosclerosis ([place])    Aortic root dilatation ([place]) 03/12/2019   a.) CTA 03/22/2019 --> 4.5 cm   Ascending aortic aneurysm 10/31/2017   a.) fusiform; measured 4.4 cm   BPH (benign prostatic hyperplasia)    CKD (chronic kidney disease), stage III ([place])    Coronary artery disease    DDD (degenerative disc disease), lumbar    Diabetic macular edema ([place]) 05/2020   Diplopia    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 Moderate to marked atypia, edge involved. Excised 09/12/2006, margins free.   Dysplastic nevus 11/19/2007   Left costal margin/infrapectoral. Slight to moderate atypia, margin focally involved.   Dysplastic nevus 11/19/2007   Right lateral flank near waistline. Moderate atypia, extends to one edge.    Dysplastic nevus 05/25/2019   Left lateral tricep. Moderate atypia, deep margin involved.    GERD (gastroesophageal reflux disease)    Hepatic steatosis    Hyperlipidemia    Hypertension    Intermittent alternating exotropia    Neurogenic bladder    a.) self catheriterizes FOUR times daily   Osteoarthritis    Serrated adenoma of colon 07/17/2016   Sliding hiatal hernia    T2DM (type 2 diabetes mellitus) ([place])     Past  Surgical History:  Procedure Laterality Date   CATARACT EXTRACTION Left 05/15/2018   CATARACT EXTRACTION Right 04/03/2018   CHOLECYSTECTOMY     COLONOSCOPY WITH PROPOFOL N/A 07/17/2016   Procedure: COLONOSCOPY WITH PROPOFOL;  Surgeon: Lollie Sails, MD;  Location: [facility] ENDOSCOPY;  Service: Endoscopy;  Laterality: N/A;   COLONOSCOPY WITH PROPOFOL N/A 02/04/2018   Procedure: COLONOSCOPY WITH PROPOFOL;  Surgeon: Lollie Sails, MD;  Location: [facility] ENDOSCOPY;  Service: Endoscopy;  Laterality: N/A;   EYE SURGERY Left 2000   Strabismus correction   FOREIGN BODY REMOVAL Left 10/23/2019   Procedure: REMOVAL FOREIGN BODY LEFT HEEL;  Surgeon: Samara Deist, DPM;  Location: [facility] ORS;  Service: Podiatry;  Laterality: Left;   GASTRIC RESTRICTION SURGERY N/A 1982   "stomach stapled for weight loss"   LUMBAR FUSION N/A 02/10/2020   Procedure: PLIF,IP,PSI L2- L3;EXPL FUSION; Location: [facility]; Surgeon: Newman Pies, MD   POSTERIOR LUMBAR FUSION N/A 05/04/2015   Procedure: LUMBAR THREE-FOUR, LUMBAR FOUR-FIVE POSTERIOR LUMBAR FUSION; Location: [facility]; Surgeon: Newman Pies, MD   TONSILLECTOMY Bilateral 1954   TRANSANAL EXCISION OF RECTAL MASS N/A 09/18/2016   Procedure: TRANSANAL EXCISION OF RECTAL TUMOR;  Surgeon: Christene Lye, MD;  Location: [facility] ORS;  Service: General;  Laterality: N/A;   ULNAR COLLATERAL LIGAMENT REPAIR Left 12/28/2020   Procedure: LEFT THUMB ULNAR COLLATERAL  LIGAMENT REPAIR/RECONSTRUCTION;  Surgeon: Corky Mull, MD;  Location: [facility] ORS;  Service: Orthopedics;  Laterality: Left;    There were no vitals filed for this visit.   Subjective Assessment - 03/03/21 1325     Subjective  I keep my splint on for about 90% of time- pain with some motion of thumb - and wrist sometimes hurt - not really using my thumb    Pertinent History On 02/10/21 seen Dr Roland Rack  - was 6 weeks status post an ulnar collateral ligament repair of his left thumb.  Surgery on 9/28  Overall, the patient feels that he is doing well. He denies any pain on today's visit, but does have some discomfort when he tries to move his thumb in certain ways. He is not taking any medications for discomfort at this time. He has been in a cast since his sutures were removed- was place in thumb spica to wear for another 4 wks - refer to OT    Patient Stated Goals Want to be able to use my thumb and hand to setup my RV, woodwork, yardwork and take care of my power tools    Currently in Pain? Yes    Pain Score 3     Pain Location Hand    Pain Orientation Left    Pain Descriptors / Indicators Aching;Tightness    Pain Type Surgical pain    Pain Onset More than a month ago    Pain Frequency Intermittent               [facility] OT Assessment - 03/03/21 0001       Assessment   Medical Diagnosis L thumb ulnar collateral lig repair    Referring Provider (OT) Dr Roland Rack    Onset Date/Surgical Date 12/28/20    Hand Dominance Right    Next MD Visit 86th Of Dec      Home  Environment   Lives With Spouse      Prior Function   Vocation Retired    Leisure RV camp with, yard work, wood work, power tools take care of      Left Hand AROM   L Thumb MCP 0-60 50 Degrees    L Thumb IP 0-80 50 Degrees    L Thumb Radial ADduction/ABduction 0-55 50    L Thumb Palmar ADduction/ABduction 0-45 52    L Thumb Opposition to Index --   Opposition to distal fold of 5th- pull over thumb                     OT Treatments/Exercises (OP) - 03/03/21 0001       LUE Fluidotherapy   Number Minutes Fluidotherapy 8 Minutes    LUE Fluidotherapy Location Hand;Wrist    Comments AROM for wrist and thumb prior to review of HEP - increase AROM             Review HEP - moist heat -2-3 x day  Pain free or slight stretch with HEP   Wrist flexion, ext, RD, UD      Thumb PA and RA  Blocked IP and MC thumb AROM   Circular AROM of thumb  10 reps All   Thumb spica still on  most of  the time   Isometric strengthening for thumb PA and RA at 45 degrees PA  2 x 15 reps pain free  2-3 x day        OT Education - 03/03/21 1347  Education Details Findings of eval and HEP    Person(s) Educated Patient    Methods Explanation;Demonstration;Tactile cues;Verbal cues;Handout    Comprehension Verbal cues required;Returned demonstration;Verbalized understanding              OT Short Term Goals - 03/03/21 1354       OT SHORT TERM GOAL #1   Title Pt to be  independent in HEP  to increase ROM and strength to wean out of thumb spica    Baseline thumb spica 90% of time-  pain with AROM of thumb -  per surgeon order splint until 10 wis    Time 2    Period Weeks    Status New    Target Date 03/17/21               OT Long Term Goals - 03/03/21 1355       OT LONG TERM GOAL #1   Title L thumb AROM improve to WNl and symptom free to hold empty glass, do buttons, retrieve screws out of  palm    Baseline pain 3/10 -  thumb AROM - decrease opposition and thumb flexion, RA and PA    Time 4    Period Weeks    Status New    Target Date 03/31/21      OT LONG TERM GOAL #2   Title L thumb strength  in PA and RA for pt to hold glass, do buttons, turn key , push buttons    Baseline no strength- thumb spica 90% of time - pain with AROM of thumb    Time 4    Period Weeks    Status New    Target Date 03/31/21      OT LONG TERM GOAL #3   Title L  grip and prehension increase to more than 60% compare to R to initiate some woodwork, turn nuts and bolts , do woodwork without symptoms    Baseline NT - 9 wks s/p - thumb spica 90% of time -  pain with AROM of thumb 3/10    Time 6    Period Weeks    Status New    Target Date 04/14/21                   Plan - 03/03/21 1349     Clinical Impression Statement Pt present at OT eval 9 1/2 wks s/p L thumb ulnar collateral ligametn repair  -pt still in thumb spica about 90 % of time and with increase stiffness and  pain in L thumb with AROM in all planes- decrease strength and functional use - pt ed on HEP for AROM in all planes and isometric strengthening for thumb PA and RA pain free- pt to   still wear splint most of the time - will initiate weaning out of splint in the next week or 2 as strengthening is initiated - pt cand benefit from skilled OT services    OT Occupational Profile and History Problem Focused Assessment - Including review of records relating to presenting problem    Occupational performance deficits (Please refer to evaluation for details): ADL's;IADL's;Play;Leisure;Social Participation    Body Structure / Function / Physical Skills ADL;Strength;Dexterity;Pain;UE functional use;ROM;IADL;Flexibility;Decreased knowledge of precautions;Coordination    Rehab Potential Good    Clinical Decision Making Limited treatment options, no task modification necessary    Comorbidities Affecting Occupational Performance: None    Modification or Assistance to Complete Evaluation  No modification of tasks or  assist necessary to complete eval    OT Frequency 1x / week    OT Duration 6 weeks    OT Treatment/Interventions Self-care/ADL training;Fluidtherapy;DME and/or AE instruction;Splinting;Therapeutic activities;Therapeutic exercise;Scar mobilization;Passive range of motion;Paraffin;Manual Therapy;Patient/family education    Consulted and Agree with Plan of Care Patient             Patient will benefit from skilled therapeutic intervention in order to improve the following deficits and impairments:   Body Structure / Function / Physical Skills: ADL, Strength, Dexterity, Pain, UE functional use, ROM, IADL, Flexibility, Decreased knowledge of precautions, Coordination       Visit Diagnosis: Pain in left hand - Plan: Ot plan of care cert/re-cert  Stiffness of left hand, not elsewhere classified - Plan: Ot plan of care cert/re-cert  Muscle weakness (generalized) - Plan: Ot plan of care  cert/re-cert    Problem List Patient Active Problem List   Diagnosis Date Noted   Spondylolisthesis of lumbar region 05/04/2015   Degeneration of intervertebral disc of lumbar region 10/19/2014   Neuritis or radiculitis due to rupture of lumbar intervertebral disc 10/19/2014   Lumbar canal stenosis 10/19/2014   Morbid obesity ([place]) 01/23/2014   Benign prostatic hyperplasia with urinary obstruction 10/01/2013   Type 2 diabetes mellitus ([place]) 10/01/2013   Benign hypertension 10/01/2013   Spinal stenosis 10/01/2013    Rosalyn Gess, OTR/L,CLT 03/03/2021, 2:03 PM  [facility] [address], [place], [address] Phone: [phone]   Fax:  [phone]  Name: Calvin Roth MRN: [id number] Date of Birth: 1945-08-19

## 2021-03-10 ENCOUNTER — Ambulatory Visit: Payer: Medicare Other | Admitting: Occupational Therapy

## 2021-03-13 ENCOUNTER — Ambulatory Visit: Payer: Medicare Other | Admitting: Occupational Therapy

## 2021-03-13 DIAGNOSIS — M6281 Muscle weakness (generalized): Secondary | ICD-10-CM

## 2021-03-13 DIAGNOSIS — M79642 Pain in left hand: Secondary | ICD-10-CM | POA: Diagnosis not present

## 2021-03-13 DIAGNOSIS — M25642 Stiffness of left hand, not elsewhere classified: Secondary | ICD-10-CM

## 2021-03-13 NOTE — Therapy (Signed)
[place] [facility] [address], [place], [address] Phone: [phone]   Fax:  [phone]  Occupational Therapy Treatment  Patient Details  Name: Calvin Roth MRN: [id number] Date of Birth: 10-26-1945 Referring Provider (OT): Dr Roland Rack   Encounter Date: 03/13/2021   OT End of Session - 03/13/21 1835     Visit Number 2    Number of Visits 6    Date for OT Re-Evaluation 04/14/21    OT Start Time 1572    OT Stop Time 1733    OT Time Calculation (min) 38 min    Activity Tolerance Patient tolerated treatment well    Behavior During Therapy [facility] for tasks assessed/performed             Past Medical History:  Diagnosis Date   Actinic keratosis    Aortic atherosclerosis ([place])    Aortic root dilatation ([place]) 03/12/2019   a.) CTA 03/22/2019 --> 4.5 cm   Ascending aortic aneurysm 10/31/2017   a.) fusiform; measured 4.4 cm   BPH (benign prostatic hyperplasia)    CKD (chronic kidney disease), stage III ([place])    Coronary artery disease    DDD (degenerative disc disease), lumbar    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 Moderate to marked atypia, edge involved. Excised 09/12/2006, margins free.   Dysplastic nevus 11/19/2007   Left costal margin/infrapectoral. Slight to moderate atypia, margin focally involved.   Dysplastic nevus 11/19/2007   Right lateral flank near waistline. Moderate atypia, extends to one edge.    Dysplastic nevus 05/25/2019   Left lateral tricep. Moderate atypia, deep margin involved.    GERD (gastroesophageal reflux disease)    Hepatic steatosis    Hyperlipidemia    Hypertension    Intermittent alternating exotropia    Neurogenic bladder    a.) self catheriterizes FOUR times daily   Osteoarthritis    Serrated adenoma of colon 07/17/2016   Sliding hiatal hernia    T2DM (type 2 diabetes mellitus) ([place])     Past  Surgical History:  Procedure Laterality Date   CATARACT EXTRACTION Left 05/15/2018   CATARACT EXTRACTION Right 04/03/2018   CHOLECYSTECTOMY     COLONOSCOPY WITH PROPOFOL N/A 07/17/2016   Procedure: COLONOSCOPY WITH PROPOFOL;  Surgeon: Lollie Sails, MD;  Location: [facility] ENDOSCOPY;  Service: Endoscopy;  Laterality: N/A;   COLONOSCOPY WITH PROPOFOL N/A 02/04/2018   Procedure: COLONOSCOPY WITH PROPOFOL;  Surgeon: Lollie Sails, MD;  [facility] ENDOSCOPY;  Service: Endoscopy;  Laterality: N/A;   EYE SURGERY Left 2000   Strabismus correction   FOREIGN BODY REMOVAL Left 10/23/2019   Procedure: REMOVAL FOREIGN BODY LEFT HEEL;  Surgeon: Samara Deist, DPM;  Location: [facility] ORS;  Service: Podiatry;  Laterality: Left;   GASTRIC RESTRICTION SURGERY N/A 1982   "stomach stapled for weight loss"   LUMBAR FUSION N/A 02/10/2020   Procedure: PLIF,IP,PSI L2- L3;EXPL FUSION; Location: [facility]; Surgeon: Newman Pies, MD   POSTERIOR LUMBAR FUSION N/A 05/04/2015   Procedure: LUMBAR THREE-FOUR, LUMBAR FOUR-FIVE POSTERIOR LUMBAR FUSION; Location: [facility]; Surgeon: Newman Pies, MD   TONSILLECTOMY Bilateral 1954   TRANSANAL EXCISION OF RECTAL MASS N/A 09/18/2016   Procedure: TRANSANAL EXCISION OF RECTAL TUMOR;  Surgeon: Christene Lye, MD;  Location: [facility] ORS;  Service: General;  Laterality: N/A;   ULNAR COLLATERAL LIGAMENT REPAIR Left 12/28/2020   Procedure: LEFT THUMB ULNAR COLLATERAL  LIGAMENT REPAIR/RECONSTRUCTION;  Surgeon: Corky Mull, MD;  Location: [facility] ORS;  Service: Orthopedics;  Laterality: Left;    There were no vitals filed for this visit.   Subjective Assessment - 03/13/21 1834     Subjective  Did okay - had some pain in the beginning when I did my exercises but then got bette- wearing my splint about 75% of the time    Pertinent History On 02/10/21 seen Dr Roland Rack  - was 6 weeks status post an ulnar collateral ligament repair of his left thumb.  Surgery on 9/28  Overall, the patient feels that he is doing well. He denies any pain on today's visit, but does have some discomfort when he tries to move his thumb in certain ways. He is not taking any medications for discomfort at this time. He has been in a cast since his sutures were removed- was place in thumb spica to wear for another 4 wks - refer to OT    Patient Stated Goals Want to be able to use my thumb and hand to setup my RV, woodwork, yardwork and take care of my power tools    Currently in Pain? No/denies                [facility] OT Assessment - 03/13/21 0001       Left Hand AROM   L Thumb MCP 0-60 50 Degrees    L Thumb IP 0-80 50 Degrees    L Thumb Radial ADduction/ABduction 0-55 55    L Thumb Palmar ADduction/ABduction 0-45 60    L Thumb Opposition to Index --   to base of 5th with light pull               Pt show increase AROM in thumb and less pain or stiffness- do have OA in thumb IP and MC  Wrist strength in all planes 4+/5 And AROM appear to be [facility]  Review changes to HEP -pt report he did not do isometric strengthening to thumb  Review HEP - moist heat -2-3 x day  Pain free or slight stretch with HEP        Thumb PA and RA  Blocked IP and MC thumb AROM   Circular AROM of thumb  10 reps All     Isometric strengthening for thumb PA and RA at 45 degrees PA  1 x 15 reps pain free  2 x day  2 lbs weight for wrist in  all planes- 15 reps  2 x day And rubber band for PA and RA 15 reps  2 x day Increase to 2nd set and then 3rd set if pain free over the next 3 and 5-6 days               OT Education - 03/13/21 1835     Education Details progress and HEP changes    Person(s) Educated Patient    Methods Explanation;Demonstration;Tactile cues;Verbal cues;Handout    Comprehension Verbal cues required;Returned demonstration;Verbalized understanding              OT Short Term Goals - 03/03/21 1354       OT SHORT TERM GOAL #1   Title  Pt to be  independent in HEP  to increase ROM and strength to wean out of thumb spica    Baseline thumb spica 90% of time-  pain with AROM of thumb -  per surgeon order splint until 10 wis    Time 2  Period Weeks    Status New    Target Date 03/17/21               OT Long Term Goals - 03/03/21 1355       OT LONG TERM GOAL #1   Title L thumb AROM improve to WNl and symptom free to hold empty glass, do buttons, retrieve screws out of  palm    Baseline pain 3/10 -  thumb AROM - decrease opposition and thumb flexion, RA and PA    Time 4    Period Weeks    Status New    Target Date 03/31/21      OT LONG TERM GOAL #2   Title L thumb strength  in PA and RA for pt to hold glass, do buttons, turn key , push buttons    Baseline no strength- thumb spica 90% of time - pain with AROM of thumb    Time 4    Period Weeks    Status New    Target Date 03/31/21      OT LONG TERM GOAL #3   Title L  grip and prehension increase to more than 60% compare to R to initiate some woodwork, turn nuts and bolts , do woodwork without symptoms    Baseline NT - 9 wks s/p - thumb spica 90% of time -  pain with AROM of thumb 3/10    Time 6    Period Weeks    Status New    Target Date 04/14/21                   Plan - 03/13/21 1836     Clinical Impression Statement Pt present at OT eval 10  1/2 wks s/p L thumb ulnar collateral ligametn repair  -pt still in thumb spica about 75 % of time  but show increase AROM in thumb and wrist - increase wrist strength -but decrease strength in thumb and functional use - pt did not do isometric strengthening for thumb PA and RA- review again with pt and Add thumb PA and RA with rubber band to HEP -and 2 lbs weight for wrist in all planes- had no pain -do have some IP and MC thumb OA -WIll reassess strenght next session , pain and wean out of splint -pt can start at home wean 2 hrs on and off - if pain free - pt can benefit from skilled OT services    OT  Occupational Profile and History Problem Focused Assessment - Including review of records relating to presenting problem    Occupational performance deficits (Please refer to evaluation for details): ADL's;IADL's;Play;Leisure;Social Participation    Body Structure / Function / Physical Skills ADL;Strength;Dexterity;Pain;UE functional use;ROM;IADL;Flexibility;Decreased knowledge of precautions;Coordination    Rehab Potential Good    Clinical Decision Making Limited treatment options, no task modification necessary    Comorbidities Affecting Occupational Performance: None    Modification or Assistance to Complete Evaluation  No modification of tasks or assist necessary to complete eval    OT Frequency 1x / week    OT Duration 6 weeks    OT Treatment/Interventions Self-care/ADL training;Fluidtherapy;DME and/or AE instruction;Splinting;Therapeutic activities;Therapeutic exercise;Scar mobilization;Passive range of motion;Paraffin;Manual Therapy;Patient/family education    Consulted and Agree with Plan of Care Patient             Patient will benefit from skilled therapeutic intervention in order to improve the following deficits and impairments:   Body Structure / Function / Physical  Skills: ADL, Strength, Dexterity, Pain, UE functional use, ROM, IADL, Flexibility, Decreased knowledge of precautions, Coordination       Visit Diagnosis: Pain in left hand  Stiffness of left hand, not elsewhere classified  Muscle weakness (generalized)    Problem List Patient Active Problem List   Diagnosis Date Noted   Spondylolisthesis of lumbar region 05/04/2015   Degeneration of intervertebral disc of lumbar region 10/19/2014   Neuritis or radiculitis due to rupture of lumbar intervertebral disc 10/19/2014   Lumbar canal stenosis 10/19/2014   Morbid obesity ([place]) 01/23/2014   Benign prostatic hyperplasia with urinary obstruction 10/01/2013   Type 2 diabetes mellitus ([place]) 10/01/2013   Benign  hypertension 10/01/2013   Spinal stenosis 10/01/2013    Rosalyn Gess, OTR/L,CLT 03/13/2021, 6:40 PM  [place] [facility] [address] [place], [address] Phone: [phone]   Fax:  [phone]  Name: Calvin Roth MRN: [id number] Date of Birth: 13-Mar-1946

## 2021-03-16 ENCOUNTER — Ambulatory Visit: Payer: Medicare Other | Admitting: Occupational Therapy

## 2021-03-21 ENCOUNTER — Ambulatory Visit: Payer: Medicare Other | Admitting: Occupational Therapy

## 2021-03-21 DIAGNOSIS — M6281 Muscle weakness (generalized): Secondary | ICD-10-CM

## 2021-03-21 DIAGNOSIS — M79642 Pain in left hand: Secondary | ICD-10-CM

## 2021-03-21 DIAGNOSIS — M25642 Stiffness of left hand, not elsewhere classified: Secondary | ICD-10-CM

## 2021-03-29 ENCOUNTER — Encounter: Payer: Self-pay | Admitting: Occupational Therapy

## 2021-03-29 ENCOUNTER — Ambulatory Visit: Payer: Medicare Other | Admitting: Occupational Therapy

## 2021-03-29 DIAGNOSIS — M6281 Muscle weakness (generalized): Secondary | ICD-10-CM

## 2021-03-29 DIAGNOSIS — M79642 Pain in left hand: Secondary | ICD-10-CM | POA: Diagnosis not present

## 2021-03-29 DIAGNOSIS — M25642 Stiffness of left hand, not elsewhere classified: Secondary | ICD-10-CM

## 2021-03-29 NOTE — Therapy (Signed)
[place] [facility] [address], [place], [address] Phone: [phone]   Fax:  [phone]  Occupational Therapy Treatment  Patient Details  Name: Calvin Roth MRN: [id number] Date of Birth: Oct 03, 1945 Referring Provider (OT): Dr Roland Rack   Encounter Date: 03/29/2021   OT End of Session - 03/29/21 1749     Visit Number 4    Number of Visits 6    Date for OT Re-Evaluation 04/14/21    OT Start Time 1345    OT Stop Time 1447    OT Time Calculation (min) 62 min    Activity Tolerance Patient tolerated treatment well    Behavior During Therapy [facility] for tasks assessed/performed             Past Medical History:  Diagnosis Date   Actinic keratosis    Aortic atherosclerosis ([place])    Aortic root dilatation ([place]) 03/12/2019   a.) CTA 03/22/2019 --> 4.5 cm   Ascending aortic aneurysm 10/31/2017   a.) fusiform; measured 4.4 cm   BPH (benign prostatic hyperplasia)    CKD (chronic kidney disease), stage III ([place])    Coronary artery disease    DDD (degenerative disc disease), lumbar    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 Moderate to marked atypia, edge involved. Excised 09/12/2006, margins free.   Dysplastic nevus 11/19/2007   Left costal margin/infrapectoral. Slight to moderate atypia, margin focally involved.   Dysplastic nevus 11/19/2007   Right lateral flank near waistline. Moderate atypia, extends to one edge.    Dysplastic nevus 05/25/2019   Left lateral tricep. Moderate atypia, deep margin involved.    GERD (gastroesophageal reflux disease)    Hepatic steatosis    Hyperlipidemia    Hypertension    Intermittent alternating exotropia    Neurogenic bladder    a.) self catheriterizes FOUR times daily   Osteoarthritis    Serrated adenoma of colon 07/17/2016   Sliding hiatal hernia    T2DM (type 2 diabetes mellitus) ([place])     Past  Surgical History:  Procedure Laterality Date   CATARACT EXTRACTION Left 05/15/2018   CATARACT EXTRACTION Right 04/03/2018   CHOLECYSTECTOMY     COLONOSCOPY WITH PROPOFOL N/A 07/17/2016   Procedure: COLONOSCOPY WITH PROPOFOL;  Surgeon: Lollie Sails, MD;  Location: [facility] ENDOSCOPY;  Service: Endoscopy;  Laterality: N/A;   COLONOSCOPY WITH PROPOFOL N/A 02/04/2018   Procedure: COLONOSCOPY WITH PROPOFOL;  Surgeon: Lollie Sails, MD;  Location: [facility] ENDOSCOPY;  Service: Endoscopy;  Laterality: N/A;   EYE SURGERY Left 2000   Strabismus correction   FOREIGN BODY REMOVAL Left 10/23/2019   Procedure: REMOVAL FOREIGN BODY LEFT HEEL;  Surgeon: Samara Deist, DPM;  Location: [facility] ORS;  Service: Podiatry;  Laterality: Left;   GASTRIC RESTRICTION SURGERY N/A 1982   "stomach stapled for weight loss"   LUMBAR FUSION N/A 02/10/2020   Procedure: PLIF,IP,PSI L2- L3;EXPL FUSION; Location: [facility]; Surgeon: Newman Pies, MD   POSTERIOR LUMBAR FUSION N/A 05/04/2015   Procedure: LUMBAR THREE-FOUR, LUMBAR FOUR-FIVE POSTERIOR LUMBAR FUSION; Location: [facility]; Surgeon: Newman Pies, MD   TONSILLECTOMY Bilateral 1954   TRANSANAL EXCISION OF RECTAL MASS N/A 09/18/2016   Procedure: TRANSANAL EXCISION OF RECTAL TUMOR;  Surgeon: Christene Lye, MD;  Location: [facility] ORS;  Service: General;  Laterality: N/A;   ULNAR COLLATERAL LIGAMENT REPAIR Left 12/28/2020   Procedure: LEFT THUMB ULNAR COLLATERAL  LIGAMENT REPAIR/RECONSTRUCTION;  Surgeon: Corky Mull, MD;  Location: [facility] ORS;  Service: Orthopedics;  Laterality: Left;    There were no vitals filed for this visit.   Subjective Assessment - 03/29/21 1748     Subjective  Pt reports he had a good Christmas, it was quiet, his grandkids were away this Christmas.  No pain at rest today.  Reports some mild pain with holding items.    Pertinent History On 02/10/21 seen Dr Roland Rack  - was 6 weeks status post an ulnar collateral  ligament repair of his left thumb. Surgery on 9/28  Overall, the patient feels that he is doing well. He denies any pain on today's visit, but does have some discomfort when he tries to move his thumb in certain ways. He is not taking any medications for discomfort at this time. He has been in a cast since his sutures were removed- was place in thumb spica to wear for another 4 wks - refer to OT    Patient Stated Goals Want to be able to use my thumb and hand to setup my RV, woodwork, yardwork and take care of my power tools    Currently in Pain? No/denies    Pain Score 0-No pain            Pt reports he was not able to perform exercises consistently over the holiday weekend with family there are busy with activities.  He progressed to 2 sets of exercises but started 3 sets since Monday.    Fluidotherapy to left hand and wrist for 10 mins to increase tissue mobility, ROM and decrease pain, temp at 105 degrees, pt instructed to perform AROM of wrist and digits while in fluido.     Isometric strengthening to the thumb in all directions Thumb palmar abduction (at 45 degrees) and radial abduction 3 sets of 15 reps for 2 times a day Circular AROM of thumb  Wrist: 2# for wrist supination/pronation, wrist flexion/extension, ulnar and radial deviation,  3 sets for 15 reps each. Resistive rubber band for palmar and radial ABD for 3 sets of 15 reps.     Pt to continue with 3 sets of 15 reps 2 times a day for exercises.   Added light blue putty for gross gripping, lateral pinch and 3 point pinch for 12-15 reps for one set each day.  He can increase to 2 sets after the next 4 days if remains pain free.     Measurements taken see below flowsheet  for details.       [facility] OT Assessment - 03/29/21 1820       AROM   Left Wrist Extension 72 Degrees    Left Wrist Flexion 58 Degrees    Left Wrist Radial Deviation 32 Degrees    Left Wrist Ulnar Deviation 38 Degrees      Strength   Right Hand Grip  (lbs) 75    Right Hand Lateral Pinch 14 lbs    Right Hand 3 Point Pinch 8 lbs    Left Hand Grip (lbs) 55    Left Hand Lateral Pinch 20 lbs    Left Hand 3 Point Pinch 15 lbs      Left Hand AROM   L Thumb MCP 0-60 52 Degrees    L Thumb IP 0-80 55 Degrees    L Thumb Radial ADduction/ABduction 0-55 55    L Thumb Palmar ADduction/ABduction 0-45 60    L Thumb Opposition to Index --  full opposition to small finger, mild pull at base of SF            Response to tx: Pt s/p ulnar collateral ligament surgery on 12/28/2020, now 13 weeks post surgery.  Pt was not able to progress consistently to 3 sets of each exercise but was able to add the 3rd set in the last 2 days.  He continues to progress, decreased pain, increased motion.  Able to tolerate 3 sets of all exercises well, added light blue putty this date for grip strength, lateral and 3 point pinch with written instructions for 1 set, 12-15 reps.  Recommend pt focus while doing exercises and not over do repetitions and pay attention to any increase in pain over the next couple days.  If pain increases then pt to decrease reps.  He demonstrates understanding.  Continue to work towards goals in plan of care to improve left UE ROM, strength and functional use for daily tasks.                   OT Education - 03/29/21 1749     Education Details HEP, putty exercises    Person(s) Educated Patient    Methods Explanation;Demonstration;Tactile cues;Verbal cues;Handout    Comprehension Verbal cues required;Returned demonstration;Verbalized understanding              OT Short Term Goals - 03/03/21 1354       OT SHORT TERM GOAL #1   Title Pt to be  independent in HEP  to increase ROM and strength to wean out of thumb spica    Baseline thumb spica 90% of time-  pain with AROM of thumb -  per surgeon order splint until 10 wis    Time 2    Period Weeks    Status New    Target Date 03/17/21               OT Long Term  Goals - 03/03/21 1355       OT LONG TERM GOAL #1   Title L thumb AROM improve to WNl and symptom free to hold empty glass, do buttons, retrieve screws out of  palm    Baseline pain 3/10 -  thumb AROM - decrease opposition and thumb flexion, RA and PA    Time 4    Period Weeks    Status New    Target Date 03/31/21      OT LONG TERM GOAL #2   Title L thumb strength  in PA and RA for pt to hold glass, do buttons, turn key , push buttons    Baseline no strength- thumb spica 90% of time - pain with AROM of thumb    Time 4    Period Weeks    Status New    Target Date 03/31/21      OT LONG TERM GOAL #3   Title L  grip and prehension increase to more than 60% compare to R to initiate some woodwork, turn nuts and bolts , do woodwork without symptoms    Baseline NT - 9 wks s/p - thumb spica 90% of time -  pain with AROM of thumb 3/10    Time 6    Period Weeks    Status New    Target Date 04/14/21                   Plan - 03/29/21 1750     Clinical Impression Statement Pt s/p  ulnar collateral ligament surgery on 12/28/2020, now 13 weeks post surgery.  Pt was not able to progress consistently to 3 sets of each exercise but was able to add the 3rd set in the last 2 days.  He continues to progress, decreased pain, increased motion.  Able to tolerate 3 sets of all exercises well, added light blue putty this date for grip strength, lateral and 3 point pinch with written instructions for 1 set, 12-15 reps.  Recommend pt focus while doing exercises and not over do repetitions and pay attention to any increase in pain over the next couple days.  If pain increases then pt to decrease reps.  He demonstrates understanding.  Continue to work towards goals in plan of care to improve left UE ROM, strength and functional use for daily tasks.    OT Occupational Profile and History Problem Focused Assessment - Including review of records relating to presenting problem    Occupational performance  deficits (Please refer to evaluation for details): ADL's;IADL's;Play;Leisure;Social Participation    Body Structure / Function / Physical Skills ADL;Strength;Dexterity;Pain;UE functional use;ROM;IADL;Flexibility;Decreased knowledge of precautions;Coordination    Rehab Potential Good    Clinical Decision Making Limited treatment options, no task modification necessary    Comorbidities Affecting Occupational Performance: None    Modification or Assistance to Complete Evaluation  No modification of tasks or assist necessary to complete eval    OT Frequency 1x / week    OT Duration 6 weeks    OT Treatment/Interventions Self-care/ADL training;Fluidtherapy;DME and/or AE instruction;Splinting;Therapeutic activities;Therapeutic exercise;Scar mobilization;Passive range of motion;Paraffin;Manual Therapy;Patient/family education    Consulted and Agree with Plan of Care Patient             Patient will benefit from skilled therapeutic intervention in order to improve the following deficits and impairments:   Body Structure / Function / Physical Skills: ADL, Strength, Dexterity, Pain, UE functional use, ROM, IADL, Flexibility, Decreased knowledge of precautions, Coordination       Visit Diagnosis: Pain in left hand  Stiffness of left hand, not elsewhere classified  Muscle weakness (generalized)    Problem List Patient Active Problem List   Diagnosis Date Noted   Spondylolisthesis of lumbar region 05/04/2015   Degeneration of intervertebral disc of lumbar region 10/19/2014   Neuritis or radiculitis due to rupture of lumbar intervertebral disc 10/19/2014   Lumbar canal stenosis 10/19/2014   Morbid obesity ([place]) 01/23/2014   Benign prostatic hyperplasia with urinary obstruction 10/01/2013   Type 2 diabetes mellitus ([place]) 10/01/2013   Benign hypertension 10/01/2013   Spinal stenosis 10/01/2013   Antonyo Hinderer T Vertis Bauder, OTR/L, CLT  Chinmayi Rumer, OT 03/29/2021, 6:28 PM  [place] [facility] [address], [place], [address] Phone: [phone]   Fax:  [phone]  Name: Calvin Roth MRN: [id number] Date of Birth: 02-Jun-1945

## 2021-03-29 NOTE — Therapy (Signed)
[place] [facility] [address], [place], [address] Phone: [phone]   Fax:  [phone]  Occupational Therapy Treatment  Patient Details  Name: Calvin Roth MRN: [id number] Date of Birth: 02-03-1946 Referring Provider (OT): Dr Roland Rack   Encounter Date: 03/21/2021   OT End of Session - 03/28/21 1543     Visit Number 3    Number of Visits 6    Date for OT Re-Evaluation 04/14/21    OT Start Time 1430    OT Stop Time 1521    OT Time Calculation (min) 51 min    Activity Tolerance Patient tolerated treatment well    Behavior During Therapy [facility] for tasks assessed/performed             Past Medical History:  Diagnosis Date   Actinic keratosis    Aortic atherosclerosis ([place])    Aortic root dilatation ([place]) 03/12/2019   a.) CTA 03/22/2019 --> 4.5 cm   Ascending aortic aneurysm 10/31/2017   a.) fusiform; measured 4.4 cm   BPH (benign prostatic hyperplasia)    CKD (chronic kidney disease), stage III ([place])    Coronary artery disease    DDD (degenerative disc disease), lumbar    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 Moderate to marked atypia, edge involved. Excised 09/12/2006, margins free.   Dysplastic nevus 11/19/2007   Left costal margin/infrapectoral. Slight to moderate atypia, margin focally involved.   Dysplastic nevus 11/19/2007   Right lateral flank near waistline. Moderate atypia, extends to one edge.    Dysplastic nevus 05/25/2019   Left lateral tricep. Moderate atypia, deep margin involved.    GERD (gastroesophageal reflux disease)    Hepatic steatosis    Hyperlipidemia    Hypertension    Intermittent alternating exotropia    Neurogenic bladder    a.) self catheriterizes FOUR times daily   Osteoarthritis    Serrated adenoma of colon 07/17/2016   Sliding hiatal hernia    T2DM (type 2 diabetes mellitus) ([place])     Past  Surgical History:  Procedure Laterality Date   CATARACT EXTRACTION Left 05/15/2018   CATARACT EXTRACTION Right 04/03/2018   CHOLECYSTECTOMY     COLONOSCOPY WITH PROPOFOL N/A 07/17/2016   Procedure: COLONOSCOPY WITH PROPOFOL;  Surgeon: Lollie Sails, MD;  Location: [facility] ENDOSCOPY;  Service: Endoscopy;  Laterality: N/A;   COLONOSCOPY WITH PROPOFOL N/A 02/04/2018   Procedure: COLONOSCOPY WITH PROPOFOL;  Surgeon: Lollie Sails, MD;  Location: [facility] ENDOSCOPY;  Service: Endoscopy;  Laterality: N/A;   EYE SURGERY Left 2000   Strabismus correction   FOREIGN BODY REMOVAL Left 10/23/2019   Procedure: REMOVAL FOREIGN BODY LEFT HEEL;  Surgeon: Samara Deist, DPM;  Location: [facility] ORS;  Service: Podiatry;  Laterality: Left;   GASTRIC RESTRICTION SURGERY N/A 1982   "stomach stapled for weight loss"   LUMBAR FUSION N/A 02/10/2020   Procedure: PLIF,IP,PSI L2- L3;EXPL FUSION; Location: [facility]; Surgeon: Newman Pies, MD   POSTERIOR LUMBAR FUSION N/A 05/04/2015   Procedure: LUMBAR THREE-FOUR, LUMBAR FOUR-FIVE POSTERIOR LUMBAR FUSION; Location: [facility]; Surgeon: Newman Pies, MD   TONSILLECTOMY Bilateral 1954   TRANSANAL EXCISION OF RECTAL MASS N/A 09/18/2016   Procedure: TRANSANAL EXCISION OF RECTAL TUMOR;  Surgeon: Christene Lye, MD;  Location: [facility] ORS;  Service: General;  Laterality: N/A;   ULNAR COLLATERAL LIGAMENT REPAIR Left 12/28/2020   Procedure: LEFT THUMB ULNAR COLLATERAL  LIGAMENT REPAIR/RECONSTRUCTION;  Surgeon: Corky Mull, MD;  Location: [facility] ORS;  Service: Orthopedics;  Laterality: Left;    There were no vitals filed for this visit.   Subjective Assessment - 03/29/21 1535     Subjective  Pt states he is slowly getting better each week.  Trying to get ready for Christmas and still needing some ideas for presents for grandkids.    Pertinent History On 02/10/21 seen Dr Roland Rack  - was 6 weeks status post an ulnar collateral ligament repair of his  left thumb. Surgery on 9/28  Overall, the patient feels that he is doing well. He denies any pain on today's visit, but does have some discomfort when he tries to move his thumb in certain ways. He is not taking any medications for discomfort at this time. He has been in a cast since his sutures were removed- was place in thumb spica to wear for another 4 wks - refer to OT    Patient Stated Goals Want to be able to use my thumb and hand to setup my RV, woodwork, yardwork and take care of my power tools    Currently in Pain? Yes    Pain Score 2     Pain Location Hand    Pain Orientation Left    Pain Descriptors / Indicators Aching    Pain Type Surgical pain    Pain Onset More than a month ago    Pain Frequency Intermittent            Pt reports he performed one set of 15 reps of exercises daily since his last session and added a second set within the last day.    Fluidotherapy to left hand and wrist for 10 mins to increase tissue mobility, ROM and decrease pain, temp at 110 degrees, pt instructed to perform AROM of wrist and digits while in fluido.    Isometric strengthening to the thumb in all directions Thumb palmar abduction (at 45 degrees) and radial abduction 2 sets of 15 reps for 2 times a day Circular AROM of thumb  Wrist: 2# for wrist supination/pronation, wrist flexion/extension, ulnar and radial deviation, 2 sets for 15 reps each. Resistive rubber band for palmar and radial ABD for 2 sets of 15 reps.    Pt may increase to 3 sets if he remains pain free after 3 days.    Discussed weaning out of splint, 2 hours on, 2 hours off.  Please wear when performing more complex tasks.     Response to tx: Pt s/p ulnar collateral ligament surgery on 12/28/2020, now 12 weeks post surgery. Pt's report of pain decreased to mild 2/10 or less with hand use, none at rest.  Pt progressing with strengthening exercises and working towards increasing number of reps and sets.  Pt at 2 sets of 15 for  each exercise and working towards increasing to 3 sets this week.  Will plan to issue resistive putty exercises next session if he is able to progress to 3 sets of exercises with rubber bands and pain free.                      OT Education - 03/29/21 1542     Education Details HEP upgrades    Person(s) Educated Patient    Methods Explanation;Demonstration;Tactile cues;Verbal cues;Handout    Comprehension Verbal cues required;Returned demonstration;Verbalized understanding              OT Short Term Goals -  03/03/21 1354       OT SHORT TERM GOAL #1   Title Pt to be  independent in HEP  to increase ROM and strength to wean out of thumb spica    Baseline thumb spica 90% of time-  pain with AROM of thumb -  per surgeon order splint until 10 wis    Time 2    Period Weeks    Status New    Target Date 03/17/21               OT Long Term Goals - 03/03/21 1355       OT LONG TERM GOAL #1   Title L thumb AROM improve to WNl and symptom free to hold empty glass, do buttons, retrieve screws out of  palm    Baseline pain 3/10 -  thumb AROM - decrease opposition and thumb flexion, RA and PA    Time 4    Period Weeks    Status New    Target Date 03/31/21      OT LONG TERM GOAL #2   Title L thumb strength  in PA and RA for pt to hold glass, do buttons, turn key , push buttons    Baseline no strength- thumb spica 90% of time - pain with AROM of thumb    Time 4    Period Weeks    Status New    Target Date 03/31/21      OT LONG TERM GOAL #3   Title L  grip and prehension increase to more than 60% compare to R to initiate some woodwork, turn nuts and bolts , do woodwork without symptoms    Baseline NT - 9 wks s/p - thumb spica 90% of time -  pain with AROM of thumb 3/10    Time 6    Period Weeks    Status New    Target Date 04/14/21                   Plan - 03/29/21 1544     Clinical Impression Statement Pt s/p ulnar collateral ligment surgery on  12/28/2020, now 12 weeks post surgery. Pt's report of pain decreased to mild 2/10 or less with hand use, none at rest.  Pt progressing with strengthening exercises and working towards increasing number of reps and sets.  Pt at 2 sets of 15 for each exercise and working towards increasing to 3 sets this week.  Will plan to issue resistive putty exercises next session if he is able to progress to 3 sets of exercises with rubber bands and pain free.    OT Occupational Profile and History Problem Focused Assessment - Including review of records relating to presenting problem    Occupational performance deficits (Please refer to evaluation for details): ADL's;IADL's;Play;Leisure;Social Participation    Body Structure / Function / Physical Skills ADL;Strength;Dexterity;Pain;UE functional use;ROM;IADL;Flexibility;Decreased knowledge of precautions;Coordination    Rehab Potential Good    Clinical Decision Making Limited treatment options, no task modification necessary    Comorbidities Affecting Occupational Performance: None    Modification or Assistance to Complete Evaluation  No modification of tasks or assist necessary to complete eval    OT Frequency 1x / week    OT Duration 6 weeks    OT Treatment/Interventions Self-care/ADL training;Fluidtherapy;DME and/or AE instruction;Splinting;Therapeutic activities;Therapeutic exercise;Scar mobilization;Passive range of motion;Paraffin;Manual Therapy;Patient/family education    Consulted and Agree with Plan of Care Patient  Patient will benefit from skilled therapeutic intervention in order to improve the following deficits and impairments:   Body Structure / Function / Physical Skills: ADL, Strength, Dexterity, Pain, UE functional use, ROM, IADL, Flexibility, Decreased knowledge of precautions, Coordination       Visit Diagnosis: Pain in left hand  Stiffness of left hand, not elsewhere classified  Muscle weakness  (generalized)    Problem List Patient Active Problem List   Diagnosis Date Noted   Spondylolisthesis of lumbar region 05/04/2015   Degeneration of intervertebral disc of lumbar region 10/19/2014   Neuritis or radiculitis due to rupture of lumbar intervertebral disc 10/19/2014   Lumbar canal stenosis 10/19/2014   Morbid obesity ([place]) 01/23/2014   Benign prostatic hyperplasia with urinary obstruction 10/01/2013   Type 2 diabetes mellitus ([place]) 10/01/2013   Benign hypertension 10/01/2013   Spinal stenosis 10/01/2013   Leotta Weingarten T Berenise Hunton, OTR/L, CLT  Arayna Illescas, OT 03/29/2021, 5:40 PM  [place] [facility] [address] [place], [address] Phone: [phone]   Fax:  [phone]  Name: Calvin Roth MRN: [id number] Date of Birth: [birth year]

## 2021-04-04 ENCOUNTER — Encounter: Payer: Self-pay | Admitting: Occupational Therapy

## 2021-04-04 ENCOUNTER — Ambulatory Visit: Payer: Medicare Other | Attending: Surgery | Admitting: Occupational Therapy

## 2021-04-04 DIAGNOSIS — M6281 Muscle weakness (generalized): Secondary | ICD-10-CM | POA: Insufficient documentation

## 2021-04-04 DIAGNOSIS — M79642 Pain in left hand: Secondary | ICD-10-CM | POA: Diagnosis present

## 2021-04-04 DIAGNOSIS — M25642 Stiffness of left hand, not elsewhere classified: Secondary | ICD-10-CM | POA: Diagnosis present

## 2021-04-07 NOTE — Therapy (Signed)
[place] [facility] [address], [place], [address] Phone: [phone]   Fax:  [phone]  Occupational Therapy Treatment  Patient Details  Name: Calvin Roth MRN: [id number] Date of Birth: [birth year] Referring Provider (OT): Dr Roland Rack   Encounter Date: 04/04/2021   OT End of Session - 04/06/21 0756     Visit Number 5    Number of Visits 6    Date for OT Re-Evaluation 04/14/21    OT Start Time 9892    OT Stop Time 1602    OT Time Calculation (min) 47 min    Activity Tolerance Patient tolerated treatment well    Behavior During Therapy [facility] for tasks assessed/performed             Past Medical History:  Diagnosis Date   Actinic keratosis    Aortic atherosclerosis ([place])    Aortic root dilatation ([place]) 03/12/2019   a.) CTA 03/22/2019 --> 4.5 cm   Ascending aortic aneurysm 10/31/2017   a.) fusiform; measured 4.4 cm   BPH (benign prostatic hyperplasia)    CKD (chronic kidney disease), stage III ([place])    Coronary artery disease    DDD (degenerative disc disease), lumbar    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 Moderate to marked atypia, edge involved. Excised 09/12/2006, margins free.   Dysplastic nevus 11/19/2007   Left costal margin/infrapectoral. Slight to moderate atypia, margin focally involved.   Dysplastic nevus 11/19/2007   Right lateral flank near waistline. Moderate atypia, extends to one edge.    Dysplastic nevus 05/25/2019   Left lateral tricep. Moderate atypia, deep margin involved.    GERD (gastroesophageal reflux disease)    Hepatic steatosis    Hyperlipidemia    Hypertension    Intermittent alternating exotropia    Neurogenic bladder    a.) self catheriterizes FOUR times daily   Osteoarthritis    Serrated adenoma of colon 07/17/2016   Sliding hiatal hernia    T2DM (type 2 diabetes mellitus) ([place])     Past Surgical  History:  Procedure Laterality Date   CATARACT EXTRACTION Left 05/15/2018   CATARACT EXTRACTION Right 04/03/2018   CHOLECYSTECTOMY     COLONOSCOPY WITH PROPOFOL N/A 07/17/2016   Procedure: COLONOSCOPY WITH PROPOFOL;  Surgeon: Lollie Sails, MD;  Location: [facility] ENDOSCOPY;  Service: Endoscopy;  Laterality: N/A;   COLONOSCOPY WITH PROPOFOL N/A 02/04/2018   Procedure: COLONOSCOPY WITH PROPOFOL;  Surgeon: Lollie Sails, MD;  Location: [facility] ENDOSCOPY;  Service: Endoscopy;  Laterality: N/A;   EYE SURGERY Left 2000   Strabismus correction   FOREIGN BODY REMOVAL Left 10/23/2019   Procedure: REMOVAL FOREIGN BODY LEFT HEEL;  Surgeon: Samara Deist, DPM;  Location: [facility] ORS;  Service: Podiatry;  Laterality: Left;   GASTRIC RESTRICTION SURGERY N/A 1982   "stomach stapled for weight loss"   LUMBAR FUSION N/A 02/10/2020   Procedure: PLIF,IP,PSI L2- L3;EXPL FUSION; Location: [facility]; Surgeon: Newman Pies, MD   POSTERIOR LUMBAR FUSION N/A 05/04/2015   Procedure: LUMBAR THREE-FOUR, LUMBAR FOUR-FIVE POSTERIOR LUMBAR FUSION; Location: [facility]; Surgeon: Newman Pies, MD   TONSILLECTOMY Bilateral 1954   TRANSANAL EXCISION OF RECTAL MASS N/A 09/18/2016   Procedure: TRANSANAL EXCISION OF RECTAL TUMOR;  Surgeon: Christene Lye, MD;  Location: [facility] ORS;  Service: General;  Laterality: N/A;   ULNAR COLLATERAL LIGAMENT REPAIR Left 12/28/2020   Procedure: LEFT THUMB ULNAR COLLATERAL  LIGAMENT REPAIR/RECONSTRUCTION;  Surgeon: Corky Mull, MD;  Location: [facility] ORS;  Service: Orthopedics;  Laterality: Left;    There were no vitals filed for this visit.   Subjective Assessment - 04/06/21 0756     Subjective  Celebrated with his kids and grandkids on New Years for the Christmas holiday.  He made lasagna and scraped his arm on the pan and didn't realize it so he has a small abrasion.  Left hand he did exercises all days except on New Years.  Went to MD on Friday, doing well  and he was told he no longer had to wear the brace.    Pertinent History On 02/10/21 seen Dr Roland Rack  - was 6 weeks status post an ulnar collateral ligament repair of his left thumb. Surgery on 9/28  Overall, the patient feels that he is doing well. He denies any pain on today's visit, but does have some discomfort when he tries to move his thumb in certain ways. He is not taking any medications for discomfort at this time. He has been in a cast since his sutures were removed- was place in thumb spica to wear for another 4 wks - refer to OT    Patient Stated Goals Want to be able to use my thumb and hand to setup my RV, woodwork, yardwork and take care of my power tools    Currently in Pain? Yes    Pain Score 0-No pain               Fluidotherapy to left hand and wrist for 10 mins to increase tissue mobility, ROM and decrease, pt instructed to perform AROM of wrist and digits while in fluido.     Isometric strengthening to the thumb in all directions Thumb palmar abduction (at 45 degrees) and radial abduction 3 sets of 15 reps for 2-3 times a day Circular AROM of thumb  Wrist: 2# for wrist supination/pronation, wrist flexion/extension, ulnar and radial deviation,  3 sets for 15 reps each, 2-3 times a day Resistive rubber band for palmar and radial ABD for 3 sets of 15 reps, 2-3 times a day Occasional verbal and/or tactile cues for proper form and technique with exercises.    Pt to continue with 3 sets of 15 reps 2-3 times a day for exercises.   Review of light blue putty for gross gripping, lateral pinch and 3 point pinch for 12-15 reps for one set each day.  Pt had some discomfort with putty initially and did not progress to 2 sets this past week.  Instructed on performing putty exercises with light gripping, slow motion since he was using a bit more force when performing.  Recommended he roll putty out to do pinches to start with rather than large block of putty.  Pt demonstrates  understanding.     Response to tx:   Pt continues to progress well, denies any pain today but did report some discomfort with putty exercises with attempts this past week.  Reinstructed on light blue putty, he appeared to be gripping with more force, recommend he lighten his grip and slow down with reps to work on movement with light resistance.  Also recommended he roll putty out long before pinches (he forgot this from last session).  Improved performance with these techniques. Pt to continue with thumb and wrist exercises for 3 sets of 15 reps 2-3 times a day, continue with putty exercises 1 time a day and progress to 2 times in  the next 3-4 days.  Reassess exercises and progress next session, continue to work towards goals in plan of care to improve strength, ROM and control pain for functional use of hands in daily tasks.                            OT Short Term Goals - 04/07/21 0817       OT SHORT TERM GOAL #1   Title Pt to be  independent in HEP  to increase ROM and strength to wean out of thumb spica    Baseline thumb spica 90% of time-  pain with AROM of thumb -  per surgeon order splint until 10 wis    Time 2    Period Weeks    Status Achieved    Target Date 03/17/21               OT Long Term Goals - 04/07/21 0817       OT LONG TERM GOAL #1   Title L thumb AROM improve to WNl and symptom free to hold empty glass, do buttons, retrieve screws out of  palm    Baseline pain 3/10 -  thumb AROM - decrease opposition and thumb flexion, RA and PA    Time 4    Period Weeks    Status On-going    Target Date 03/31/21      OT LONG TERM GOAL #2   Title L thumb strength  in PA and RA for pt to hold glass, do buttons, turn key , push buttons    Baseline no strength- thumb spica 90% of time - pain with AROM of thumb    Time 4    Period Weeks    Status On-going    Target Date 03/31/21      OT LONG TERM GOAL #3   Title L  grip and prehension increase to more  than 60% compare to R to initiate some woodwork, turn nuts and bolts , do woodwork without symptoms    Baseline NT - 9 wks s/p - thumb spica 90% of time -  pain with AROM of thumb 3/10    Time 6    Period Weeks    Status On-going    Target Date 04/14/21                   Plan - 04/07/21 0756     Clinical Impression Statement Pt s/p ulnar collateral ligament surgery on 12/28/2020, now 14 weeks post surgery. Pt continues to progress well, denies any pain today but did report some discomfort with putty exercises with attempts this past week.  Reinstructed on light blue putty, he appeared to be gripping with more force, recommend he lighten his grip and slow down with reps to work on movement with light resistance.  Also recommended he roll putty out long before pinches (he forgot this from last session).  Improved performance with these techniques. Pt to continue with thumb and wrist exercises for 3 sets of 15 reps 2-3 times a day, continue with putty exercises 1 time a day and progress to 2 times in the next 3-4 days.  Reassess exercises and progress next session, continue to work towards goals in plan of care to improve strength, ROM and control pain for functional use of hands in daily tasks.    OT Occupational Profile and History Problem Focused Assessment - Including review of records relating to  presenting problem    Occupational performance deficits (Please refer to evaluation for details): ADL's;IADL's;Play;Leisure;Social Participation    Body Structure / Function / Physical Skills ADL;Strength;Dexterity;Pain;UE functional use;ROM;IADL;Flexibility;Decreased knowledge of precautions;Coordination    Rehab Potential Good    Clinical Decision Making Limited treatment options, no task modification necessary    Comorbidities Affecting Occupational Performance: None    Modification or Assistance to Complete Evaluation  No modification of tasks or assist necessary to complete eval    OT  Frequency 1x / week    OT Duration 6 weeks    OT Treatment/Interventions Self-care/ADL training;Fluidtherapy;DME and/or AE instruction;Splinting;Therapeutic activities;Therapeutic exercise;Scar mobilization;Passive range of motion;Paraffin;Manual Therapy;Patient/family education    Consulted and Agree with Plan of Care Patient             Patient will benefit from skilled therapeutic intervention in order to improve the following deficits and impairments:   Body Structure / Function / Physical Skills: ADL, Strength, Dexterity, Pain, UE functional use, ROM, IADL, Flexibility, Decreased knowledge of precautions, Coordination       Visit Diagnosis: Pain in left hand  Stiffness of left hand, not elsewhere classified  Muscle weakness (generalized)    Problem List Patient Active Problem List   Diagnosis Date Noted   Spondylolisthesis of lumbar region 05/04/2015   Degeneration of intervertebral disc of lumbar region 10/19/2014   Neuritis or radiculitis due to rupture of lumbar intervertebral disc 10/19/2014   Lumbar canal stenosis 10/19/2014   Morbid obesity ([place]) 01/23/2014   Benign prostatic hyperplasia with urinary obstruction 10/01/2013   Type 2 diabetes mellitus ([place]) 10/01/2013   Benign hypertension 10/01/2013   Spinal stenosis 10/01/2013   Caitlin Ainley T Tionne Carelli, OTR/L, CLT  Ciel Yanes, OT 04/07/2021, 8:18 AM  [place] [facility] [address] [place], [address] Phone: [phone]   Fax:  [phone]  Name: Calvin Roth MRN: [id number] Date of Birth: [birth year]

## 2021-04-12 ENCOUNTER — Ambulatory Visit: Payer: Medicare Other | Admitting: Occupational Therapy

## 2021-04-12 DIAGNOSIS — M79642 Pain in left hand: Secondary | ICD-10-CM | POA: Diagnosis not present

## 2021-04-12 DIAGNOSIS — M6281 Muscle weakness (generalized): Secondary | ICD-10-CM

## 2021-04-12 DIAGNOSIS — M25642 Stiffness of left hand, not elsewhere classified: Secondary | ICD-10-CM

## 2021-04-12 NOTE — Therapy (Signed)
[facility] [address] [place], [place], [address] Phone: [phone]   Fax:  [phone]  Occupational Therapy Treatment  Patient Details  Name: Calvin Roth MRN: [id number] Date of Birth: Aug 10, 1945 Referring Provider (OT): Dr Roland Rack   Encounter Date: 04/12/2021   OT End of Session - 04/12/21 1659     Visit Number 6    Number of Visits 10    Date for OT Re-Evaluation 05/24/21    OT Start Time 4235    OT Stop Time 1650    OT Time Calculation (min) 42 min    Activity Tolerance Patient tolerated treatment well    Behavior During Therapy [facility] for tasks assessed/performed             Past Medical History:  Diagnosis Date   Actinic keratosis    Aortic atherosclerosis ([place])    Aortic root dilatation ([place]) 03/12/2019   a.) CTA 03/22/2019 --> 4.5 cm   Ascending aortic aneurysm 10/31/2017   a.) fusiform; measured 4.4 cm   BPH (benign prostatic hyperplasia)    CKD (chronic kidney disease), stage III ([place])    Coronary artery disease    DDD (degenerative disc disease), lumbar    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 Moderate to marked atypia, edge involved. Excised 09/12/2006, margins free.   Dysplastic nevus 11/19/2007   Left costal margin/infrapectoral. Slight to moderate atypia, margin focally involved.   Dysplastic nevus 11/19/2007   Right lateral flank near waistline. Moderate atypia, extends to one edge.    Dysplastic nevus 05/25/2019   Left lateral tricep. Moderate atypia, deep margin involved.    GERD (gastroesophageal reflux disease)    Hepatic steatosis    Hyperlipidemia    Hypertension    Intermittent alternating exotropia    Neurogenic bladder    a.) self catheriterizes FOUR times daily   Osteoarthritis    Serrated adenoma of colon 07/17/2016   Sliding hiatal hernia    T2DM (type 2 diabetes mellitus) ([place])     Past  Surgical History:  Procedure Laterality Date   CATARACT EXTRACTION Left 05/15/2018   CATARACT EXTRACTION Right 04/03/2018   CHOLECYSTECTOMY     COLONOSCOPY WITH PROPOFOL N/A 07/17/2016   Procedure: COLONOSCOPY WITH PROPOFOL;  Surgeon: Lollie Sails, MD;  Location: [facility] ENDOSCOPY;  Service: Endoscopy;  Laterality: N/A;   COLONOSCOPY WITH PROPOFOL N/A 02/04/2018   Procedure: COLONOSCOPY WITH PROPOFOL;  Surgeon: Lollie Sails, MD;  Location: [facility] ENDOSCOPY;  Service: Endoscopy;  Laterality: N/A;   EYE SURGERY Left 2000   Strabismus correction   FOREIGN BODY REMOVAL Left 10/23/2019   Procedure: REMOVAL FOREIGN BODY LEFT HEEL;  Surgeon: Samara Deist, DPM;  Location: [facility] ORS;  Service: Podiatry;  Laterality: Left;   GASTRIC RESTRICTION SURGERY N/A 1982   "stomach stapled for weight loss"   LUMBAR FUSION N/A 02/10/2020   Procedure: PLIF,IP,PSI L2- L3;EXPL FUSION; Location: [facility]; Surgeon: Newman Pies, MD   POSTERIOR LUMBAR FUSION N/A 05/04/2015   Procedure: LUMBAR THREE-FOUR, LUMBAR FOUR-FIVE POSTERIOR LUMBAR FUSION; Location: [facility]; Surgeon: Newman Pies, MD   TONSILLECTOMY Bilateral 1954   TRANSANAL EXCISION OF RECTAL MASS N/A 09/18/2016   Procedure: TRANSANAL EXCISION OF RECTAL TUMOR;  Surgeon: Christene Lye, MD;  Location: [facility] ORS;  Service: General;  Laterality: N/A;   ULNAR COLLATERAL LIGAMENT REPAIR Left 12/28/2020   Procedure: LEFT THUMB ULNAR COLLATERAL  LIGAMENT REPAIR/RECONSTRUCTION;  Surgeon: Corky Mull, MD;  Location: [facility] ORS;  Service: Orthopedics;  Laterality: Left;    There were no vitals filed for this visit.   Subjective Assessment - 04/12/21 1653     Subjective  Doing okay- using it more -did something the other day reaching over head at the gym -and could tell I was not ready for it -but I am going with out my splint more    Pertinent History On 02/10/21 seen Dr Roland Rack  - was 6 weeks status post an ulnar collateral  ligament repair of his left thumb. Surgery on 9/28  Overall, the patient feels that he is doing well. He denies any pain on today's visit, but does have some discomfort when he tries to move his thumb in certain ways. He is not taking any medications for discomfort at this time. He has been in a cast since his sutures were removed- was place in thumb spica to wear for another 4 wks - refer to OT    Patient Stated Goals Want to be able to use my thumb and hand to setup my RV, woodwork, yardwork and take care of my power tools    Currently in Pain? Yes    Pain Score 2     Pain Location Hand    Pain Orientation Left    Pain Descriptors / Indicators Aching                [facility] OT Assessment - 04/12/21 0001       Strength   Right Hand Grip (lbs) 75    Right Hand Lateral Pinch 20 lbs    Right Hand 3 Point Pinch 15 lbs    Left Hand Grip (lbs) 55    Left Hand Lateral Pinch 16 lbs    Left Hand 3 Point Pinch 9 lbs             AROM WFL for PA and RA - opposition pull to base of 5th  Pt to focus on end range PA and RA -  And doing Rubberband for PA and RA - 12 reps 3 sets - 2 x day   Stop doing 2 lbs weight and isometric strengthening     Upgrade to med teal putty gripping, lateral pinch and 3 point pinch for 12-15 reps But short sausage -and stop when feeling pull - about 3 sec  Quality more than quantity  12 reps - 15 reps 2 x day  Increase 2nd set in 3 days if pain free and 6 days 3 sets  2 x day if pain free    Measurements taken               OT Education - 04/12/21 1659     Education Details HEP, putty exercises    Person(s) Educated Patient    Methods Explanation;Demonstration;Tactile cues;Verbal cues;Handout    Comprehension Verbal cues required;Returned demonstration;Verbalized understanding              OT Short Term Goals - 04/12/21 1704       OT SHORT TERM GOAL #1   Title Pt to be  independent in HEP  to increase ROM and strength to wean  out of thumb spica    Status Achieved               OT Long Term Goals - 04/12/21 1704       OT LONG TERM GOAL #1   Title L thumb  AROM improve to WNl and symptom free to hold empty glass, do buttons, retrieve screws out of  palm    Baseline pain 3/10 -  thumb AROM - decrease opposition and thumb flexion, RA and PA - now still some discomfort with 3 point pinch loaded,    Time 4    Period Weeks    Status On-going    Target Date 05/10/21      OT LONG TERM GOAL #2   Title L thumb strength  in PA and RA for pt to hold glass, do buttons, turn key , push buttons    Status Achieved      OT LONG TERM GOAL #3   Title L  grip and prehension increase to more than 60% compare to R to initiate some woodwork, turn nuts and bolts , do woodwork without symptoms    Baseline NT - 9 wks s/p - thumb spica 90% of time -  pain with AROM of thumb 3/10   NOW - pain with pinching lat and 3 point - grip - stop when feeling pull -    Time 6    Period Weeks    Status On-going    Target Date 05/24/21                   Plan - 04/12/21 1700     Clinical Impression Statement Pt s/p ulnar collateral ligament surgery on 12/28/2020, now 15 weeks post surgery. Pt show increase stability and strength in L hand and thumb but pain with grip and prehension doing putty - and when turning large top of bottle. Review with pt putty HEP and upgrade to teal med putty - but pt to focus more than quality than quantity - and not lateral torque on thumb. Same with rubberband for thumb PA and RA - quality more than quantity - keeping pain at less than 1-2/10 pull or stretch. Cont to work towards goals in plan of care to improve strength without pain for functional use of hands in daily tasks.    OT Occupational Profile and History Problem Focused Assessment - Including review of records relating to presenting problem    Occupational performance deficits (Please refer to evaluation for details):  ADL's;IADL's;Play;Leisure;Social Participation    Body Structure / Function / Physical Skills ADL;Strength;Dexterity;Pain;UE functional use;ROM;IADL;Flexibility;Decreased knowledge of precautions;Coordination    Rehab Potential Good    Clinical Decision Making Limited treatment options, no task modification necessary    Comorbidities Affecting Occupational Performance: None    Modification or Assistance to Complete Evaluation  No modification of tasks or assist necessary to complete eval    OT Frequency 1x / week    OT Duration 6 weeks    OT Treatment/Interventions Self-care/ADL training;Fluidtherapy;DME and/or AE instruction;Splinting;Therapeutic activities;Therapeutic exercise;Scar mobilization;Passive range of motion;Paraffin;Manual Therapy;Patient/family education    Consulted and Agree with Plan of Care Patient             Patient will benefit from skilled therapeutic intervention in order to improve the following deficits and impairments:   Body Structure / Function / Physical Skills: ADL, Strength, Dexterity, Pain, UE functional use, ROM, IADL, Flexibility, Decreased knowledge of precautions, Coordination       Visit Diagnosis: Pain in left hand  Stiffness of left hand, not elsewhere classified  Muscle weakness (generalized)    Problem List Patient Active Problem List   Diagnosis Date Noted   Spondylolisthesis of lumbar region 05/04/2015   Degeneration of intervertebral disc of lumbar region 10/19/2014  Neuritis or radiculitis due to rupture of lumbar intervertebral disc 10/19/2014   Lumbar canal stenosis 10/19/2014   Morbid obesity ([place]) 01/23/2014   Benign prostatic hyperplasia with urinary obstruction 10/01/2013   Type 2 diabetes mellitus ([place]) 10/01/2013   Benign hypertension 10/01/2013   Spinal stenosis 10/01/2013    Rosalyn Gess, OTR/L,CLT 04/12/2021, 5:07 PM  [facility] [address], [place], [address] Phone: [phone]   Fax:  [phone]  Name: Calvin Roth MRN: [id number] Date of Birth: [birth year]

## 2021-04-21 ENCOUNTER — Ambulatory Visit: Payer: Medicare Other | Admitting: Occupational Therapy

## 2021-04-21 DIAGNOSIS — M25642 Stiffness of left hand, not elsewhere classified: Secondary | ICD-10-CM

## 2021-04-21 DIAGNOSIS — M79642 Pain in left hand: Secondary | ICD-10-CM

## 2021-04-21 DIAGNOSIS — M6281 Muscle weakness (generalized): Secondary | ICD-10-CM

## 2021-04-21 NOTE — Therapy (Signed)
[facility] [address], [place], [address] Phone: [phone]   Fax:  [phone]  Occupational Therapy Treatment  Patient Details  Name: Calvin Roth MRN: [id number] Date of Birth: September 24, 1945 Referring Provider (OT): Dr Roland Rack   Encounter Date: 04/21/2021   OT End of Session - 04/21/21 1132     Visit Number 7    Number of Visits 10    Date for OT Re-Evaluation 05/24/21    OT Start Time 6195    OT Stop Time 1105    OT Time Calculation (min) 18 min    Activity Tolerance Patient tolerated treatment well    Behavior During Therapy [facility] for tasks assessed/performed             Past Medical History:  Diagnosis Date   Actinic keratosis    Aortic atherosclerosis ([place])    Aortic root dilatation ([place]) 03/12/2019   a.) CTA 03/22/2019 --> 4.5 cm   Ascending aortic aneurysm 10/31/2017   a.) fusiform; measured 4.4 cm   BPH (benign prostatic hyperplasia)    CKD (chronic kidney disease), stage III ([place])    Coronary artery disease    DDD (degenerative disc disease), lumbar    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 Moderate to marked atypia, edge involved. Excised 09/12/2006, margins free.   Dysplastic nevus 11/19/2007   Left costal margin/infrapectoral. Slight to moderate atypia, margin focally involved.   Dysplastic nevus 11/19/2007   Right lateral flank near waistline. Moderate atypia, extends to one edge.    Dysplastic nevus 05/25/2019   Left lateral tricep. Moderate atypia, deep margin involved.    GERD (gastroesophageal reflux disease)    Hepatic steatosis    Hyperlipidemia    Hypertension    Intermittent alternating exotropia    Neurogenic bladder    a.) self catheriterizes FOUR times daily   Osteoarthritis    Serrated adenoma of colon 07/17/2016   Sliding hiatal hernia    T2DM (type 2 diabetes mellitus) ([place])     Past  Surgical History:  Procedure Laterality Date   CATARACT EXTRACTION Left 05/15/2018   CATARACT EXTRACTION Right 04/03/2018   CHOLECYSTECTOMY     COLONOSCOPY WITH PROPOFOL N/A 07/17/2016   Procedure: COLONOSCOPY WITH PROPOFOL;  Surgeon: Lollie Sails, MD;  Location: [facility] ENDOSCOPY;  Service: Endoscopy;  Laterality: N/A;   COLONOSCOPY WITH PROPOFOL N/A 02/04/2018   Procedure: COLONOSCOPY WITH PROPOFOL;  Surgeon: Lollie Sails, MD;  Location: [facility] ENDOSCOPY;  Service: Endoscopy;  Laterality: N/A;   EYE SURGERY Left 2000   Strabismus correction   FOREIGN BODY REMOVAL Left 10/23/2019   Procedure: REMOVAL FOREIGN BODY LEFT HEEL;  Surgeon: Samara Deist, DPM;  Location: [facility] ORS;  Service: Podiatry;  Laterality: Left;   GASTRIC RESTRICTION SURGERY N/A 1982   "stomach stapled for weight loss"   LUMBAR FUSION N/A 02/10/2020   Procedure: PLIF,IP,PSI L2- L3;EXPL FUSION; Location[facility]; Surgeon: Newman Pies, MD   POSTERIOR LUMBAR FUSION N/A 05/04/2015   Procedure: LUMBAR THREE-FOUR, LUMBAR FOUR-FIVE POSTERIOR LUMBAR FUSION; Location: [facility]; Surgeon: Newman Pies, MD   TONSILLECTOMY Bilateral 1954   TRANSANAL EXCISION OF RECTAL MASS N/A 09/18/2016   Procedure: TRANSANAL EXCISION OF RECTAL TUMOR;  Surgeon: Christene Lye, MD;  Location: [facility] ORS;  Service: General;  Laterality: N/A;   ULNAR COLLATERAL LIGAMENT REPAIR Left 12/28/2020   Procedure: LEFT THUMB ULNAR COLLATERAL  LIGAMENT REPAIR/RECONSTRUCTION;  Surgeon: Corky Mull, MD;  Location: [facility] ORS;  Service: Orthopedics;  Laterality: Left;    There were no vitals filed for this visit.   Subjective Assessment - 04/21/21 1131     Subjective  Done the putty - easier and no pain - using my hand more - can use my thumb now to cut my nails- leaving next week for my roadtrip    Pertinent History On 02/10/21 seen Dr Roland Rack  - was 6 weeks status post an ulnar collateral ligament repair of his left  thumb. Surgery on 9/28  Overall, the patient feels that he is doing well. He denies any pain on today's visit, but does have some discomfort when he tries to move his thumb in certain ways. He is not taking any medications for discomfort at this time. He has been in a cast since his sutures were removed- was place in thumb spica to wear for another 4 wks - refer to OT    Patient Stated Goals Want to be able to use my thumb and hand to setup my RV, woodwork, yardwork and take care of my power tools    Currently in Pain? No/denies                [facility] OT Assessment - 04/21/21 0001       Strength   Right Hand Grip (lbs) 75    Right Hand Lateral Pinch 20 lbs    Right Hand 3 Point Pinch 16 lbs    Left Hand Grip (lbs) 62    Left Hand Lateral Pinch 18 lbs    Left Hand 3 Point Pinch 15 lbs                   AROM WFL for PA and RA - opposition to base of 5th  pain free PA and RA  of thumb strength 5/5 and no pain this date  Pt to cont with  Rubberband for PA and RA - 12 reps 3 sets - 2 x day          Upgrade to green med firm  putty gripping, lateral pinch and 3 point pinch for 12-15 reps short sausage -and stop when feeling pull - about 3 sec  Quality more than quantity  12 reps - 15 reps 2 x day  Increase 2nd set in 3 days if pain free and 6 days 3 sets  2 x day if pain free  And then can add 1/2 dark firm blue putty to green   Measurements taken  - grip and prehension strength increase greatly                OT Education - 04/21/21 1132     Education Details HEP, putty exercises    Person(s) Educated Patient    Methods Explanation;Demonstration;Tactile cues;Verbal cues;Handout    Comprehension Verbal cues required;Returned demonstration;Verbalized understanding              OT Short Term Goals - 04/12/21 1704       OT SHORT TERM GOAL #1   Title Pt to be  independent in HEP  to increase ROM and strength to wean out of thumb spica    Status  Achieved               OT Long Term Goals - 04/12/21 1704       OT LONG TERM GOAL #1   Title L thumb AROM improve to  WNl and symptom free to hold empty glass, do buttons, retrieve screws out of  palm    Baseline pain 3/10 -  thumb AROM - decrease opposition and thumb flexion, RA and PA - now still some discomfort with 3 point pinch loaded,    Time 4    Period Weeks    Status On-going    Target Date 05/10/21      OT LONG TERM GOAL #2   Title L thumb strength  in PA and RA for pt to hold glass, do buttons, turn key , push buttons    Status Achieved      OT LONG TERM GOAL #3   Title L  grip and prehension increase to more than 60% compare to R to initiate some woodwork, turn nuts and bolts , do woodwork without symptoms    Baseline NT - 9 wks s/p - thumb spica 90% of time -  pain with AROM of thumb 3/10   NOW - pain with pinching lat and 3 point - grip - stop when feeling pull -    Time 6    Period Weeks    Status On-going    Target Date 05/24/21                   Plan - 04/21/21 1132     Clinical Impression Statement Pt s/p ulnar collateral ligament surgery on 12/28/2020, now 16 weeks post surgery. Pt show increase stability and strength in L hand and thumb with increase grip and prehension this date compare to last time. No pain with putty or thumb RA and PA -  upgrade putty to med green -and provided some firm dark blue to upgrade to in 10 day while on vacation.   Review with pt putty HEP can to cont to focus on quality more  than quantity - and not lateral torque on thumb. Same with rubberband for thumb PA and RA - quality more than quantity - keeping pain free and call if need follow up in month.    OT Occupational Profile and History Problem Focused Assessment - Including review of records relating to presenting problem    Occupational performance deficits (Please refer to evaluation for details): ADL's;IADL's;Play;Leisure;Social Participation    Body Structure /  Function / Physical Skills ADL;Strength;Dexterity;Pain;UE functional use;ROM;IADL;Flexibility;Decreased knowledge of precautions;Coordination    Rehab Potential Good    Clinical Decision Making Limited treatment options, no task modification necessary    Comorbidities Affecting Occupational Performance: None    Modification or Assistance to Complete Evaluation  No modification of tasks or assist necessary to complete eval    OT Frequency Monthly    OT Duration 4 weeks    OT Treatment/Interventions Self-care/ADL training;Fluidtherapy;DME and/or AE instruction;Splinting;Therapeutic activities;Therapeutic exercise;Scar mobilization;Passive range of motion;Paraffin;Manual Therapy;Patient/family education    Consulted and Agree with Plan of Care Patient             Patient will benefit from skilled therapeutic intervention in order to improve the following deficits and impairments:   Body Structure / Function / Physical Skills: ADL, Strength, Dexterity, Pain, UE functional use, ROM, IADL, Flexibility, Decreased knowledge of precautions, Coordination       Visit Diagnosis: Pain in left hand  Stiffness of left hand, not elsewhere classified  Muscle weakness (generalized)    Problem List Patient Active Problem List   Diagnosis Date Noted   Spondylolisthesis of lumbar region 05/04/2015   Degeneration of intervertebral disc of lumbar region 10/19/2014  Neuritis or radiculitis due to rupture of lumbar intervertebral disc 10/19/2014   Lumbar canal stenosis 10/19/2014   Morbid obesity ([place]) 01/23/2014   Benign prostatic hyperplasia with urinary obstruction 10/01/2013   Type 2 diabetes mellitus ([place]) 10/01/2013   Benign hypertension 10/01/2013   Spinal stenosis 10/01/2013    Rosalyn Gess, OTR/L,CLT 04/21/2021, 11:36 AM  [facility] [address], [place], [address] Phone: [phone]   Fax:   [phone]  Name: Calvin Roth MRN: [id number] Date of Birth: Mar 03, 1946

## 2021-05-23 ENCOUNTER — Other Ambulatory Visit: Payer: Self-pay

## 2021-05-23 ENCOUNTER — Ambulatory Visit: Admission: RE | Admit: 2021-05-23 | Payer: Medicare Other | Source: Ambulatory Visit

## 2021-05-23 ENCOUNTER — Ambulatory Visit
Admission: RE | Admit: 2021-05-23 | Discharge: 2021-05-23 | Disposition: A | Discharge status: Home / Self Care | Payer: Medicare Other | Source: Ambulatory Visit | Attending: Internal Medicine | Admitting: Internal Medicine

## 2021-05-23 ENCOUNTER — Other Ambulatory Visit: Payer: Medicare Other

## 2021-05-23 DIAGNOSIS — I712 Thoracic aortic aneurysm, without rupture, unspecified: Secondary | ICD-10-CM | POA: Diagnosis not present

## 2021-05-23 LAB — POCT I-STAT CREATININE: Creatinine, Ser: 1.5 mg/dL — ABNORMAL HIGH (ref 0.61–1.24)

## 2021-05-23 MED ORDER — IOHEXOL 350 MG/ML SOLN
85.0000 mL | Freq: Once | INTRAVENOUS | Status: AC | PRN
  Administered 2021-05-23: 85 mL via INTRAVENOUS

## 2021-05-23 MED ORDER — IOHEXOL 350 MG/ML SOLN
100.0000 mL | Freq: Once | INTRAVENOUS | Status: DC | PRN
Start: 2021-05-23 — End: 2021-05-23

## 2021-05-29 ENCOUNTER — Other Ambulatory Visit: Payer: Self-pay | Admitting: Internal Medicine

## 2021-05-29 DIAGNOSIS — N281 Cyst of kidney, acquired: Secondary | ICD-10-CM

## 2021-05-29 DIAGNOSIS — N2889 Other specified disorders of kidney and ureter: Secondary | ICD-10-CM

## 2021-06-01 ENCOUNTER — Other Ambulatory Visit: Payer: Self-pay

## 2021-06-01 ENCOUNTER — Ambulatory Visit (INDEPENDENT_AMBULATORY_CARE_PROVIDER_SITE_OTHER): Payer: Medicare Other | Admitting: Dermatology

## 2021-06-01 DIAGNOSIS — Z86018 Personal history of other benign neoplasm: Secondary | ICD-10-CM

## 2021-06-01 DIAGNOSIS — L570 Actinic keratosis: Secondary | ICD-10-CM

## 2021-06-01 DIAGNOSIS — D692 Other nonthrombocytopenic purpura: Secondary | ICD-10-CM

## 2021-06-01 DIAGNOSIS — L578 Other skin changes due to chronic exposure to nonionizing radiation: Secondary | ICD-10-CM

## 2021-06-01 DIAGNOSIS — Z1283 Encounter for screening for malignant neoplasm of skin: Secondary | ICD-10-CM | POA: Diagnosis not present

## 2021-06-01 DIAGNOSIS — L814 Other melanin hyperpigmentation: Secondary | ICD-10-CM

## 2021-06-01 DIAGNOSIS — L821 Other seborrheic keratosis: Secondary | ICD-10-CM

## 2021-06-01 DIAGNOSIS — D229 Melanocytic nevi, unspecified: Secondary | ICD-10-CM

## 2021-06-01 DIAGNOSIS — D1800 Hemangioma unspecified site: Secondary | ICD-10-CM

## 2021-06-01 NOTE — Progress Notes (Signed)
? ?Follow-Up Visit ?  ?Subjective  ?Calvin Roth is a 76 y.o. male who presents for the following: Annual Exam (Mole check ). ?The patient presents for Total-Body Skin Exam (TBSE) for skin cancer screening and mole check.  The patient has spots, moles and lesions to be evaluated, some may be new or changing and the patient has concerns that these could be cancer.  ? ?The following portions of the chart were reviewed this encounter and updated as appropriate:  ? Tobacco  Allergies  Meds  Problems  Med Hx  Surg Hx  Fam Hx   ?  ?Review of Systems:  No other skin or systemic complaints except as noted in HPI or Assessment and Plan. ? ?Objective  ?Well appearing patient in no apparent distress; mood and affect are within normal limits. ? ?A full examination was performed including scalp, head, eyes, ears, nose, lips, neck, chest, axillae, abdomen, back, buttocks, bilateral upper extremities, bilateral lower extremities, hands, feet, fingers, toes, fingernails, and toenails. All findings within normal limits unless otherwise noted below. ? ?scalp x 4 (4) ?Erythematous thin papules/macules with gritty scale.  ? ? ?Assessment & Plan  ?AK (actinic keratosis) (4) ?scalp x 4 ? ?Destruction of lesion - scalp x 4 ?Complexity: simple   ?Destruction method: cryotherapy   ?Informed consent: discussed and consent obtained   ?Timeout:  patient name, date of birth, surgical site, and procedure verified ?Lesion destroyed using liquid nitrogen: Yes   ?Region frozen until ice ball extended beyond lesion: Yes   ?Outcome: patient tolerated procedure well with no complications   ?Post-procedure details: wound care instructions given   ? ?Skin cancer screening ? ?Lentigines ?- Scattered tan macules ?- Due to sun exposure ?- Benign-appearing, observe ?- Recommend daily broad spectrum sunscreen SPF 30+ to sun-exposed areas, reapply every 2 hours as needed. ?- Call for any changes ? ?Seborrheic Keratoses ?- Stuck-on, waxy, tan-brown  papules and/or plaques  ?- Benign-appearing ?- Discussed benign etiology and prognosis. ?- Observe ?- Call for any changes ? ?Melanocytic Nevi ?- Tan-brown and/or pink-flesh-colored symmetric macules and papules ?- Benign appearing on exam today ?- Observation ?- Call clinic for new or changing moles ?- Recommend daily use of broad spectrum spf 30+ sunscreen to sun-exposed areas.  ? ?Hemangiomas ?- Red papules ?- Discussed benign nature ?- Observe ?- Call for any changes ? ?Actinic Damage ?- Chronic condition, secondary to cumulative UV/sun exposure ?- diffuse scaly erythematous macules with underlying dyspigmentation ?- Recommend daily broad spectrum sunscreen SPF 30+ to sun-exposed areas, reapply every 2 hours as needed.  ?- Staying in the shade or wearing long sleeves, sun glasses (UVA+UVB protection) and wide brim hats (4-inch brim around the entire circumference of the hat) are also recommended for sun protection.  ?- Call for new or changing lesions. ? ?Purpura - Chronic; persistent and recurrent.  Treatable, but not curable. ?Arms  ?- Violaceous macules and patches ?- Benign ?- Related to trauma, age, sun damage and/or use of blood thinners, chronic use of topical and/or oral steroids ?- Observe ?- Can use OTC arnica containing moisturizer such as Dermend Bruise Formula if desired ?- Call for worsening or other concerns  ? ?History of Dysplastic Nevi ?Multiple see history (4) ?- No evidence of recurrence today ?- Recommend regular full body skin exams ?- Recommend daily broad spectrum sunscreen SPF 30+ to sun-exposed areas, reapply every 2 hours as needed.  ?- Call if any new or changing lesions are noted between office visits  ? ?  Skin cancer screening performed today.  ? ?Return in about 1 year (around 06/02/2022) for TBSE, hx of Dysplastic nevus . ? ?I, Marye Round, CMA, am acting as scribe for Sarina Ser, MD .  ?Documentation: I have reviewed the above documentation for accuracy and completeness, and  I agree with the above. ? ?Sarina Ser, MD ? ?

## 2021-06-01 NOTE — Patient Instructions (Addendum)

## 2021-06-02 ENCOUNTER — Other Ambulatory Visit: Payer: Self-pay | Admitting: Internal Medicine

## 2021-06-02 ENCOUNTER — Other Ambulatory Visit (HOSPITAL_COMMUNITY): Payer: Self-pay | Admitting: Internal Medicine

## 2021-06-02 ENCOUNTER — Encounter: Payer: Self-pay | Admitting: Dermatology

## 2021-06-02 DIAGNOSIS — R9389 Abnormal findings on diagnostic imaging of other specified body structures: Secondary | ICD-10-CM

## 2021-06-08 ENCOUNTER — Other Ambulatory Visit: Payer: Self-pay

## 2021-06-08 ENCOUNTER — Ambulatory Visit
Admission: RE | Admit: 2021-06-08 | Discharge: 2021-06-08 | Disposition: A | Discharge status: Home / Self Care | Payer: Medicare Other | Source: Ambulatory Visit | Attending: Internal Medicine | Admitting: Internal Medicine

## 2021-06-08 DIAGNOSIS — N2889 Other specified disorders of kidney and ureter: Secondary | ICD-10-CM

## 2021-06-08 DIAGNOSIS — N281 Cyst of kidney, acquired: Secondary | ICD-10-CM | POA: Diagnosis not present

## 2021-06-08 MED ORDER — IOHEXOL 300 MG/ML  SOLN
100.0000 mL | Freq: Once | INTRAMUSCULAR | Status: AC | PRN
  Administered 2021-06-08: 15:00:00 100 mL via INTRAVENOUS

## 2021-06-27 ENCOUNTER — Institutional Professional Consult (permissible substitution) (INDEPENDENT_AMBULATORY_CARE_PROVIDER_SITE_OTHER): Payer: Medicare Other | Admitting: Physician Assistant

## 2021-06-27 ENCOUNTER — Other Ambulatory Visit: Payer: Self-pay

## 2021-06-27 VITALS — BP 122/80 | HR 99 | Resp 20 | Ht 71.0 in

## 2021-06-27 DIAGNOSIS — I712 Thoracic aortic aneurysm, without rupture, unspecified: Secondary | ICD-10-CM | POA: Diagnosis not present

## 2021-06-27 NOTE — Progress Notes (Signed)
? ?   ?[address] ?      [place] [address] ?            [phone]   ? ?  ?Calvin Roth ?[id number] ?[birth year] ? ?History of Present Illness: ? ?[name] is a 76 yo male with history of HLD, Neurogenic bladder requires self I/O cath, DM, HTN, and Obesity.  He is being referred to [facility] for evaluation of Ascending Aortic Aneurysm and newly found ground glass opacity in the right lower lobe.  The patient states he is familiar with ascending aneurysm as his father had one.  He required surgery but ultimately never left the hospital due to infection.  He is concerned about the thing in his lung as he has never smoked.  He denies chest pain, shortness of breath.  He occasionally experiences pain/weakness in his legs due to his neuropathy.  He denies weight loss, cough, hemoptysis, and environmental exposures.  ? ?Current Outpatient Medications on File Prior to Visit  ?Medication Sig Dispense Refill  ? atorvastatin (LIPITOR) 10 MG tablet Take 10 mg by mouth in the morning.    ? carvedilol (COREG) 6.25 MG tablet Take 6.25 mg by mouth 2 (two) times daily.    ? dorzolamide-timolol (COSOPT) 22.3-6.8 MG/ML ophthalmic solution Place 1 drop into the right eye 2 (two) times daily.    ? gabapentin (NEURONTIN) 400 MG capsule Take 1,200 mg by mouth at bedtime.    ? LEVEMIR FLEXTOUCH 100 UNIT/ML Pen Inject 56 Units into the skin in the morning.  11  ? losartan-hydrochlorothiazide (HYZAAR) 100-12.5 MG tablet Take 1 tablet by mouth in the morning.    ? metFORMIN (GLUCOPHAGE-XR) 500 MG 24 hr tablet Take 1,000 mg by mouth 2 (two) times daily.     ? Multiple Vitamin (MULTIVITAMIN WITH MINERALS) TABS tablet Take 1 tablet by mouth in the morning. Multivitamin for Seniors    ? omeprazole (PRILOSEC) 20 MG capsule Take 20 mg by mouth in the morning.    ? oxymetazoline (AFRIN) 0.05 % nasal spray Place 1 spray into both nostrils in the morning.    ? tamsulosin (FLOMAX) 0.4 MG CAPS capsule Take 1 capsule (0.4 mg total)  by mouth daily. (Patient taking differently: Take 0.4 mg by mouth every evening.) 30 capsule 1  ? ?No current facility-administered medications on file prior to visit.  ? ? ? ?BP 122/80 (BP Location: Right Arm, Patient Position: Sitting)   Pulse 99   Resp 20   Ht '5\' 11"'$  (1.803 m)   SpO2 95% Comment: RA  BMI 36.26 kg/m?  ? ?Physical Exam ? ?Gen: NAD ?Heart: RRR ?Lungs: CTA ?Abd: soft non tender ?Ext: no edema ?Neuro- intact ? ?CTA Results: ? ?IMPRESSION: ?CTA CHEST ?  ?1. Mild fusiform aneurysmal dilation of the ascending thoracic aorta ?with a maximal diameter of 4.5 cm. Additionally, the aortic root is ?dilated at 4.7 cm. Ascending thoracic aortic aneurysm. Recommend ?semi-annual imaging followup by CTA or MRA and referral to ?cardiothoracic surgery if not already obtained. This recommendation ?follows 2010 ACCF/AHA/AATS/ACR/ASA/SCA/SCAI/SIR/STS/SVM Guidelines ?for the Diagnosis and Management of Patients With Thoracic Aortic ?Disease. Circulation. 2010; 121: N235-T732. ?2. Rounded patch of ground-glass attenuation airspace opacity in the ?right lower lobe measures up to 2.7 cm in greatest diameter. ?Differential considerations include an active focus of ?infection/inflammation versus a low grade primary bronchogenic ?neoplasm such as low-grade adenocarcinoma, or adenocarcinoma in ?situ. Initial follow-up with CT at 6-12 months is recommended to ?confirm persistence. If persistent,  repeat CT is recommended every 2 ?years until 5 years of stability has been established. This ?recommendation follows the consensus statement: Guidelines for ?Management of Incidental Pulmonary Nodules Detected on CT Images: ?From the Fleischner Society 2017; Radiology 2017; 284:228-243. ?3. Multi-vessel coronary artery atherosclerotic vascular ?calcifications. ?4. Multiple punctate centrilobular pulmonary nodules in the ?periphery of the upper lungs bilaterally. Findings are most ?consistent with smoking related disease such as  respiratory ?bronchiolitis interstitial lung disease (RB-ILD). ?5. Evidence of old granulomatous disease with stable subpleural ?pulmonary nodules and calcified right hilar lymph nodes. ?  ?CTA ABD/PELVIS ?  ?1. No evidence of abdominal aortic aneurysm. ?2. 1.9 cm intermediate density lesion partially exophytic from the ?interpolar left kidney. Differential considerations include complex ?cyst versus papillary renal cell carcinoma. Further evaluation with ?pre and post contrast MRI or CT should be considered. MRI is ?preferred in younger patients (due to lack of ionizing radiation) ?and for evaluating calcified lesion(s). ?3. Markedly atrophied and calcified pancreas suggests sequelae of ?chronic pancreatitis. ?4. Additional ancillary findings as above. ?  ?Aortic aneurysm NOS (ICD10-I71.9); Aortic Atherosclerosis ?(ICD10-I70.0). ?  ?Signed, ?  ?Criselda Peaches, MD, RPVI ?  ?[facility] ?  ?[facility] ?  ?  ?Electronically Signed ?  By: Jacqulynn Cadet M.D. ?  On: 05/23/2021 10:53 ? ? ?A/P: ? ?Ascending Aortic Aneurysm with Root Dilatation of 4.7 cm, will need semi annual follow up ?New Right Lower Lobe ground glass opacity measuring 2.7 x 1.9 cm in diameter- have discussed imaging with Dr. Roxan Hockey it is felt patient should be evaluated further and we will set up appointment with Dr. 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 Roxan Hockey for his new ground glass opacity  ? ?Risk Modification: ? ?Statin:  Yes ? ?Smoking cessation instruction/counseling given:   ?Never smoker ? ?Patient was counseled on importance of Blood Pressure Control.  Despite Medical intervention if the patient  notices persistently elevated blood pressure readings.  They are instructed to contact their Primary Care Physician ? ?Please avoid use of Fluoroquinolones as this can potentially increase your risk of Aortic Rupture and/or Dissection ? ?Patient educated on signs and symptoms of Aortic Dissection, handout also provided in AVS ? ?Ellwood Handler, PA-C ?06/27/21 ? ? ? ? ?

## 2021-06-27 NOTE — Patient Instructions (Addendum)
Make every effort to maintain a "heart-healthy" lifestyle with regular physical exercise and adherence to a low-fat, low-carbohydrate diet.  Continue to seek regular follow-up appointments with your primary care physician and/or cardiologist. ? ? ?AVOID FLOUROQUINOLONES (Antibiotic Class) AS THESE CAN INCREASE YOUR RISK OF DISSECTION ?

## 2021-07-05 ENCOUNTER — Encounter: Payer: Medicare Other | Admitting: Thoracic Surgery (Cardiothoracic Vascular Surgery)

## 2021-07-12 ENCOUNTER — Institutional Professional Consult (permissible substitution) (INDEPENDENT_AMBULATORY_CARE_PROVIDER_SITE_OTHER): Payer: Medicare Other | Admitting: Thoracic Surgery (Cardiothoracic Vascular Surgery)

## 2021-07-12 VITALS — BP 140/80 | HR 90 | Resp 20 | Ht 71.0 in | Wt 249.0 lb

## 2021-07-12 DIAGNOSIS — R911 Solitary pulmonary nodule: Secondary | ICD-10-CM | POA: Diagnosis not present

## 2021-07-12 NOTE — Progress Notes (Signed)
? ?   ?[address] ?      [place] [address] ?            [id number]   ? ?HPI: Mr. Calvin Roth returns to discuss the results of his CT scans regarding a renal cyst and groundglass opacity in the right lower lobe. ? ?Calvin Roth is a 76 year old man with a past history significant for hypertension, hyperlipidemia, obesity, and diabetes complicated by neurogenic bladder.  In February he had a CT to follow-up an ascending aneurysm.  It showed a 4.5 cm ascending aneurysm.  He was also noted to have old granulomatous disease.  There was a new groundglass opacity in the right lower lobe measuring 3.0 x 1.7 cm.  There also was a questionable renal cyst. ? ?He has not had any respiratory symptoms recently.  He is a lifelong non-smoker.  Denies shortness of breath, chest pain, pressure, or tightness. ? ?Past Medical History:  ?Diagnosis Date  ? Actinic keratosis   ? Aortic atherosclerosis ([place])   ? Aortic root dilatation ([place]) 03/12/2019  ? a.) CTA 03/22/2019 --> 4.5 cm  ? Ascending aortic aneurysm 10/31/2017  ? a.) fusiform; measured 4.4 cm  ? BPH (benign prostatic hyperplasia)   ? CKD (chronic kidney disease), stage III ([place])   ? Coronary artery disease   ? DDD (degenerative disc disease), lumbar   ? 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 Diplopia   ? 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 Right medial pectoral. Moderate to marked atypia, edge involved. Excised 09/12/2006, margins free.  ? Dysplastic nevus 11/19/2007  ? Left costal margin/infrapectoral. Slight to moderate atypia, margin focally involved.  ? Dysplastic nevus 11/19/2007  ? Right lateral flank near waistline. Moderate atypia, extends to one edge.   ? Dysplastic nevus 05/25/2019  ? Left lateral tricep. Moderate atypia, deep margin involved.   ? GERD (gastroesophageal reflux disease)   ? Hepatic steatosis   ? Hyperlipidemia   ? Hypertension   ? Intermittent alternating exotropia   ? Neurogenic bladder   ? a.) self catheriterizes FOUR times  daily  ? Osteoarthritis   ? 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 Sliding hiatal hernia   ? T2DM (type 2 diabetes mellitus) ([place])   ? ? ?Current Outpatient Medications  ?Medication Sig Dispense Refill  ? atorvastatin (LIPITOR) 10 MG tablet Take 10 mg by mouth in the morning.    ? carvedilol (COREG) 6.25 MG tablet Take 6.25 mg by mouth 2 (two) times daily.    ? dorzolamide-timolol (COSOPT) 22.3-6.8 MG/ML ophthalmic solution Place 1 drop into the right eye 2 (two) times daily.    ? gabapentin (NEURONTIN) 400 MG capsule Take 1,200 mg by mouth at bedtime.    ? LEVEMIR FLEXTOUCH 100 UNIT/ML Pen Inject 56 Units into the skin in the morning.  11  ? losartan-hydrochlorothiazide (HYZAAR) 100-12.5 MG tablet Take 1 tablet by mouth in the morning.    ? metFORMIN (GLUCOPHAGE-XR) 500 MG 24 hr tablet Take 1,000 mg by mouth 2 (two) times daily.     ? Multiple Vitamin (MULTIVITAMIN WITH MINERALS) TABS tablet Take 1 tablet by mouth in the morning. Multivitamin for Seniors    ? omeprazole (PRILOSEC) 20 MG capsule Take 20 mg by mouth in the morning.    ? oxymetazoline (AFRIN) 0.05 % nasal spray Place 1 spray into both nostrils in the morning.    ? RYBELSUS 7 MG TABS Take 1 tablet by mouth daily.    ? tamsulosin (  FLOMAX) 0.4 MG CAPS capsule Take 1 capsule (0.4 mg total) by mouth daily. (Patient taking differently: Take 0.4 mg by mouth every evening.) 30 capsule 1  ? ?No current facility-administered medications for this visit.  ? ? ?Physical Exam ?BP 140/80   Pulse 90   Resp 20   Ht '5\' 11"'$  (1.803 m)   Wt 249 lb (112.9 kg)   SpO2 95%   BMI 34.78 kg/m?  ?76 year old man in no acute distress ?Alert and oriented x3 with no focal deficits ?Lungs clear with equal breath sounds bilaterally ?Cardiac regular rate and rhythm with no murmurs ? ?Diagnostic Tests: ?CT ANGIOGRAPHY CHEST, ABDOMEN AND PELVIS ?  ?TECHNIQUE: ?Non-contrast CT of the chest was initially obtained. ?  ?Multidetector CT imaging through the chest, abdomen and pelvis  was ?performed using the standard protocol during bolus administration of ?intravenous contrast. Multiplanar reconstructed images and MIPs were ?obtained and reviewed to evaluate the vascular anatomy. ?  ?RADIATION DOSE REDUCTION: This exam was performed according to the ?departmental dose-optimization program which includes automated ?exposure control, adjustment of the mA and/or kV according to ?patient size and/or use of iterative reconstruction technique. ?  ?CONTRAST:  42m OMNIPAQUE IOHEXOL 350 MG/ML SOLN ?  ?COMPARISON:  CTA chest 03/12/2019; 05/17/2020 ?  ?FINDINGS: ?CTA CHEST FINDINGS ?  ?Cardiovascular: 2 vessel arch anatomy. The right brachiocephalic and ?left common carotid artery share a common origin. The aortic root is ?dilated at 4.7 cm measured at the sinuses of Valsalva. No effacement ?of the sino-tubular junction which measures 3.6 cm. Aneurysmal ?dilation of the tubular portion of the ascending thoracic aorta with ?a maximal diameter of 4.5 cm. Minimal atherosclerotic plaque. The ?main pulmonary artery is normal in size. The heart is normal in ?size. Atherosclerotic calcifications visualized throughout the ?coronary arteries. No pericardial effusion. ?  ?Mediastinum/Nodes: Unremarkable appearance of the thyroid gland. No ?suspicious mediastinal mass or adenopathy. Multiple small calcified ?lymph nodes present in the right hilar and paraesophageal nodal ?stations. Small hiatal hernia. ?  ?Lungs/Pleura: Numerous tiny 1-2 mm centrilobular pulmonary nodules ?in the periphery of the lungs. 0.6 cm subpleural pulmonary nodule ?affiliated with the minor fissure remains unchanged dating back to ?December of 2020. Greater than 2 year stability consistent with ?benignity. Stable dystrophic calcification in the right lower lobe. ?Additional small scattered calcified and noncalcified subpleural ?nodules also remain unchanged. There is a new abnormality within the ?central aspect of the right lower lobe which  was not present on the ?prior imaging. The abnormality is a ground-glass attenuation focus ?measuring approximately 2.7 x 1.9 cm in greatest axial dimensions no ?definite solid component. ?  ?Musculoskeletal: No acute fracture or aggressive appearing lytic or ?blastic osseous lesion. ?  ?Review of the MIP images confirms the above findings. ?  ?CTA ABDOMEN AND PELVIS FINDINGS ?  ?VASCULAR ?  ?Aorta: Normal caliber aorta without aneurysm, dissection, vasculitis ?or significant stenosis. Trace atherosclerotic calcifications. ?  ?Celiac: Patent without evidence of aneurysm, dissection, vasculitis ?or significant stenosis. ?  ?SMA: Patent without evidence of aneurysm, dissection, vasculitis or ?significant stenosis. ?  ?Renals: Both renal arteries are patent without evidence of aneurysm, ?dissection, vasculitis, fibromuscular dysplasia or significant ?stenosis. ?  ?IMA: Patent without evidence of aneurysm, dissection, vasculitis or ?significant stenosis. ?  ?Inflow: Patent without evidence of aneurysm, dissection, vasculitis ?or significant stenosis. ?  ?Veins: No obvious venous abnormality within the limitations of this ?arterial phase study. ?  ?Review of the MIP images confirms the above findings. ?  ?NON-VASCULAR ?  ?  Hepatobiliary: No focal liver abnormality is seen. Status post ?cholecystectomy. No biliary dilatation. ?  ?Pancreas: Marked pancreatic atrophy. Calcifications are present ?throughout the atrophied gland. ?  ?Spleen: Normal in size without focal abnormality. ?  ?Adrenals/Urinary Tract: Normal adrenal glands. No hydronephrosis, ?nephrolithiasis or enhancing renal mass. 1.9 cm intermediate ?attenuation lesion in the interpolar left kidney. No definitive ?enhancement on delayed phase images. The ureters and bladder are ?unremarkable. Additional small low-attenuation lesions bilaterally ?are too small to characterize. ?  ?Stomach/Bowel: Stomach is within normal limits. Appendix appears ?normal. No evidence  of bowel wall thickening, distention, or ?inflammatory changes. ?  ?Lymphatic: No suspicious lymphadenopathy. ?  ?Reproductive: Prostate is unremarkable. ?  ?Other: No abdominal wall hernia or abnormality. No abd

## 2021-08-29 ENCOUNTER — Encounter: Payer: Medicare Other | Admitting: Thoracic Surgery (Cardiothoracic Vascular Surgery)

## 2021-09-16 IMAGING — RF DG LUMBAR SPINE 2-3V
1 series · 2 of 2 positions shown · non-contrast
Comparison: Lumbar spine radiographs 09/15/2019. lumbar CT
myelogram 10/14/2019

CLINICAL DATA: Surgery, elective. Additional history provided:
Revision of posterior lumbar fusion. Provided fluoroscopy time 10
seconds (8.86 mGy).

EXAM:
LUMBAR SPINE - 2-3 VIEW; DG C-ARM 1-60 MIN

[Series 1: run · 2 of 2 slices shown]
[im 1/2]
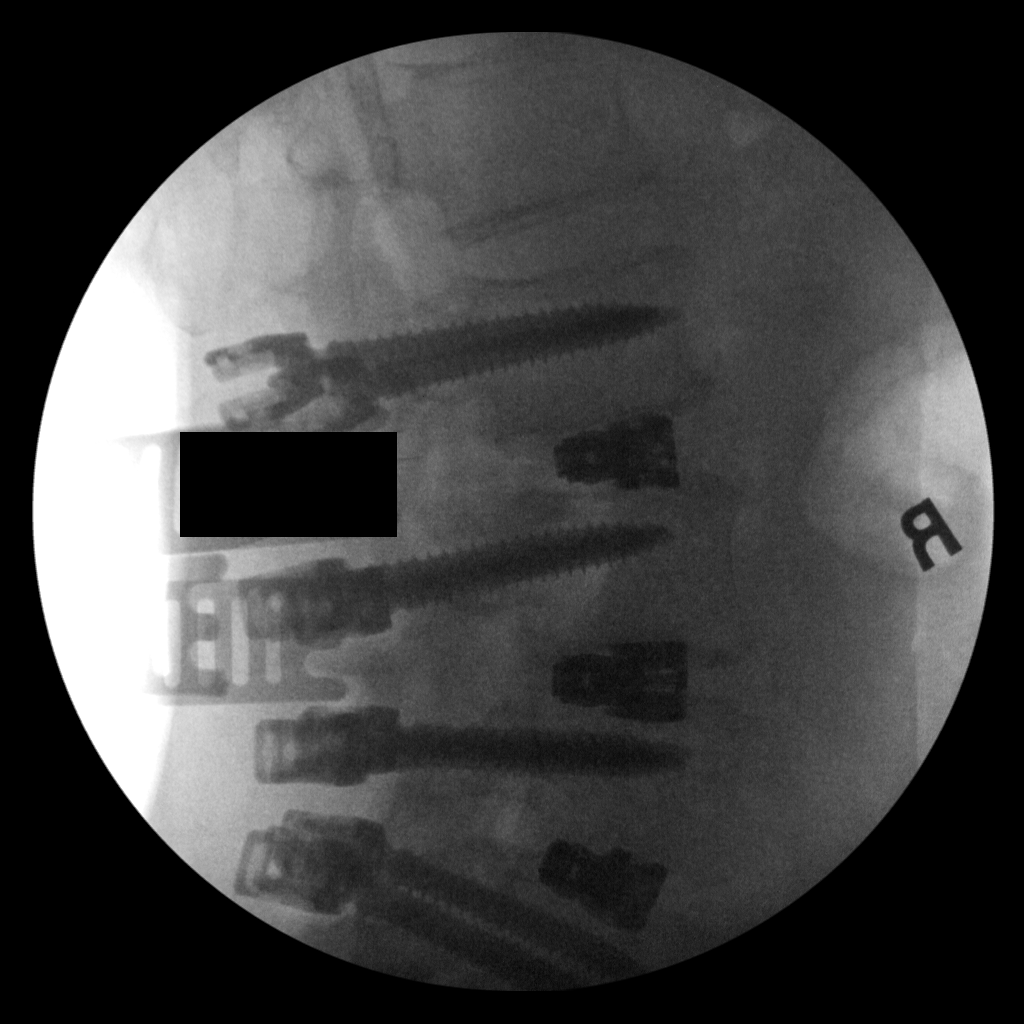
[im 2/2]
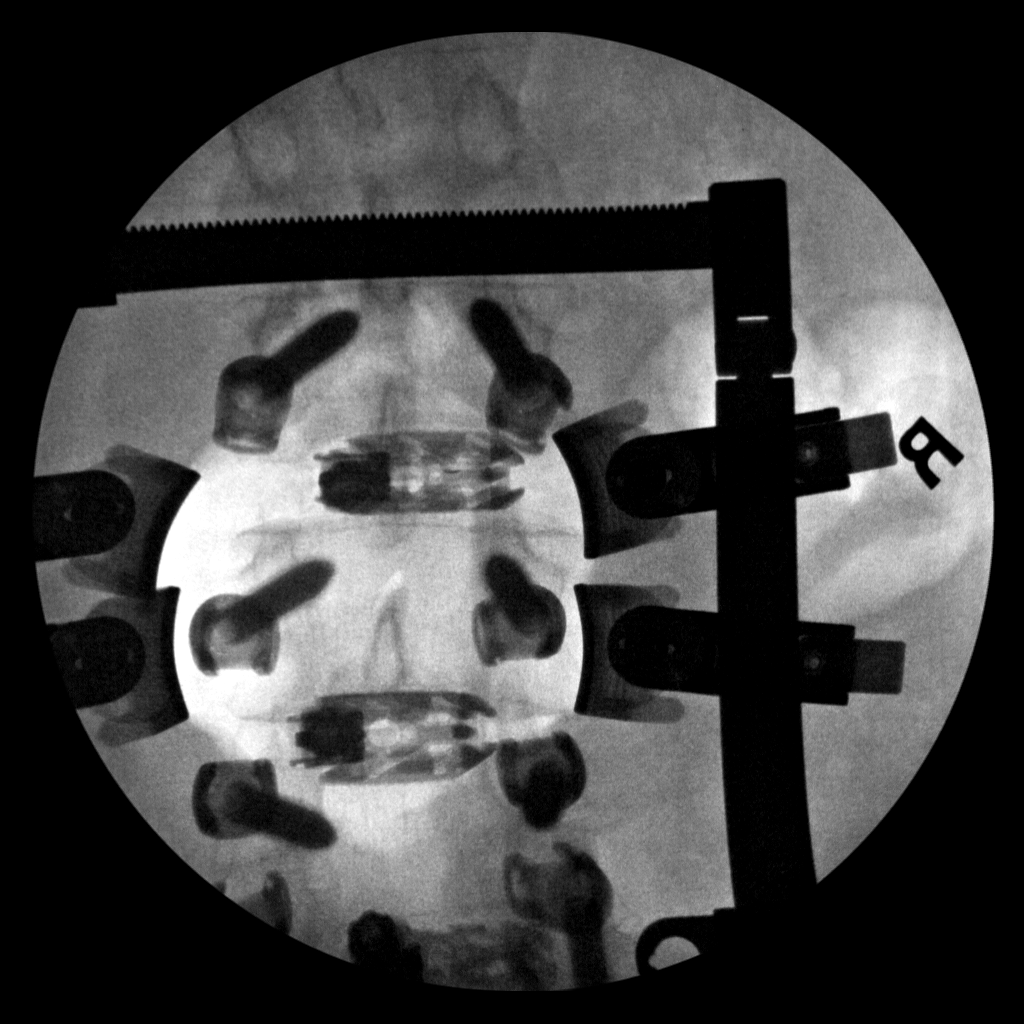

[2 of 2 positions shown; findings below may reference images not displayed]

FINDINGS: PA and lateral view intraoperative fluoroscopic images of the lumbar
spine are submitted, 2 images total. The images demonstrate
bilateral pedicle screws at what are presumed to be the L2, L3, L4
and L5 levels. Vertical interconnecting rods were not present at the
time the images were taken. Interbody spacers are present at what
are presumed to be the L2-L3, L3-L4 and L4-L5 levels. Overlying
retractors.
IMPRESSION: Two intraoperative fluoroscopic images of the lumbar spine, as
described.

## 2021-11-23 ENCOUNTER — Ambulatory Visit
Admission: RE | Admit: 2021-11-23 | Discharge: 2021-11-23 | Disposition: A | Discharge status: Home / Self Care | Payer: Medicare Other | Source: Ambulatory Visit | Attending: Internal Medicine | Admitting: Internal Medicine

## 2021-11-23 DIAGNOSIS — R9389 Abnormal findings on diagnostic imaging of other specified body structures: Secondary | ICD-10-CM | POA: Insufficient documentation

## 2021-12-05 ENCOUNTER — Ambulatory Visit (INDEPENDENT_AMBULATORY_CARE_PROVIDER_SITE_OTHER): Payer: Medicare Other | Admitting: Thoracic Surgery (Cardiothoracic Vascular Surgery)

## 2021-12-05 VITALS — BP 95/61 | HR 78 | Resp 18 | Ht 71.0 in | Wt 236.0 lb

## 2021-12-05 DIAGNOSIS — R911 Solitary pulmonary nodule: Secondary | ICD-10-CM | POA: Diagnosis not present

## 2021-12-05 DIAGNOSIS — I712 Thoracic aortic aneurysm, without rupture, unspecified: Secondary | ICD-10-CM | POA: Diagnosis not present

## 2021-12-05 NOTE — Progress Notes (Signed)
[address]       [place] [id number]             [phone]     HPI: [name] returns for follow-up of a groundglass opacity in the right lower lobe and an ascending aneurysm.  Calvin Roth is a 76 year old man with a history of hypertension, hyperlipidemia, obesity, diabetes complicated by neurogenic bladder, granulomatous lung nodules, and a 4.5 cm ascending aneurysm.  He had a CT in February 2023 which showed a 4.5 cm ascending aneurysm.  There was some old granulomatous disease and a new groundglass opacity in the right lower lobe which measured about 3.0 x 1.7 cm.  He then had an abdominal CT to evaluate a renal cyst which showed the groundglass opacity was slightly smaller.  He has been feeling well.  He had has lost a significant amount of weight since I last saw him.  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 Moderate to marked atypia, edge involved. Excised 09/12/2006, margins free.   Dysplastic nevus 11/19/2007   Left costal margin/infrapectoral. Slight to moderate atypia, margin focally involved.   Dysplastic nevus 11/19/2007   Right lateral flank near waistline. Moderate atypia, extends to one edge.    Dysplastic nevus 05/25/2019   Left lateral tricep. Moderate atypia, deep margin involved.    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     carvedilol (COREG) 6.25 MG tablet Take 6.25 mg by mouth 2 (two) times daily.     dorzolamide-timolol (COSOPT) 22.3-6.8 MG/ML ophthalmic solution Place 1 drop into the right eye 2 (two) times daily.     gabapentin (NEURONTIN) 400 MG capsule Take 1,200 mg by mouth at bedtime.     LEVEMIR FLEXTOUCH 100 UNIT/ML Pen Inject 30 Units into the skin in the morning.  11   losartan-hydrochlorothiazide (HYZAAR) 100-12.5 MG tablet Take 1 tablet by mouth in the morning.     metFORMIN (GLUCOPHAGE-XR) 500 MG 24 hr tablet Take 1,000 mg by mouth 2 (two) times daily.      Multiple Vitamin (MULTIVITAMIN WITH MINERALS) TABS tablet Take 1 tablet by mouth in the morning. Multivitamin for Seniors     omeprazole (PRILOSEC) 20 MG capsule Take 20 mg by mouth in the morning.     oxymetazoline (AFRIN) 0.05 % nasal spray Place 1 spray into both nostrils in the morning.     tamsulosin (FLOMAX) 0.4 MG CAPS capsule Take 1 capsule (0.4 mg total)  by mouth daily. (Patient taking differently: Take 0.4 mg by mouth every evening.) 30 capsule 1   tirzepatide (MOUNJARO) 10 MG/0.5ML Pen Inject into the skin.     No current facility-administered medications for this visit.    Physical Exam BP 95/61 (BP Location: Left Arm, Patient Position: Sitting)   Pulse 78   Resp 18   Ht '5\' 11"'$  (1.803 m)   Wt 236 lb (107 kg)   SpO2 95% Comment: RA  BMI 32.37 kg/m  76 year old man in no acute distress Alert and oriented x3 with no focal deficits No carotid bruits Cardiac regular rate and rhythm with no murmur Lungs clear bilaterally  Diagnostic Tests: CT CHEST WITHOUT CONTRAST   TECHNIQUE: Multidetector CT imaging of the chest was performed following  the standard protocol without IV contrast.   RADIATION DOSE REDUCTION: This exam was performed according to the departmental dose-optimization program which includes automated exposure control, adjustment of the mA and/or kV according to patient size and/or use of iterative reconstruction technique.   COMPARISON:  Chest CT 05/23/2021   FINDINGS: Cardiovascular: There is aneurysmal dilatation of the ascending aorta measuring 5.3 cm, unchanged. The heart is normal in size. There are atherosclerotic calcifications of the aorta and coronary arteries. There is no pericardial effusion.   Mediastinum/Nodes: No enlarged mediastinal or axillary lymph nodes. Thyroid gland, trachea, and esophagus demonstrate no significant findings. There are calcified subcarinal and right hilar lymph nodes likely related to old granulomatous disease.   Lungs/Pleura: There is a stable 5 mm fissural nodule in the right middle lobe. There is a stable 5 mm subpleural nodule in the right lower lobe. These are unchanged from 2020 and favored as benign. Previously identified ground-glass opacity in the right lower lobe has completely resolved. There is a calcified granuloma in the right lung base. There is no new focal lung infiltrate.   Upper Abdomen: Cholecystectomy clips are present. There surgical changes in the proximal stomach.   Musculoskeletal: No chest wall mass or suspicious bone lesions identified.   IMPRESSION: 1. Ground-glass opacity in the right lower lobe has completely resolved. No new focal lung infiltrate. No new pulmonary nodules. 2. Old granulomatous disease. 3. Stable aneurysmal dilatation of the ascending aorta measuring 5.3 cm. Ascending thoracic aortic aneurysm. Recommend semi-annual imaging followup by CTA or MRA and referral to cardiothoracic surgery if not already obtained. This recommendation follows 2010 ACCF/AHA/AATS/ACR/ASA/SCA/SCAI/SIR/STS/SVM Guidelines for the Diagnosis and  Management of Patients With Thoracic Aortic Disease. Circulation. 2010; 121: T557-D220. Aortic aneurysm NOS (ICD10-I71.9)     Electronically Signed   By: Ronney Asters M.D.   On: 11/24/2021 22:42   I personally reviewed the CT images.  There has been resolution of the right lower lobe groundglass opacity.  Calcified granulomas.  There is an ascending aneurysm maximal size about 4.7 cm at the sinuses of Valsalva and about 4.5 cm in the ascending aorta proper.  Impression: Calvin Roth is a 76 year old man with a history of hypertension, hyperlipidemia, obesity, diabetes complicated by neurogenic bladder, granulomatous lung nodules, and a 4.5 cm ascending aneurysm.  Groundglass opacity right lower lobe-resolved  Ascending aneurysm-5.3 cm measurement clearly not perpendicular to the axis of flow.  That was taken in an area with significant curvature of the aorta.  When carefully measured it appears to be unchanged at 4.7 cm in the sinuses of Valsalva and 4.5 cm in the ascending aorta proper.  Needs continued semiannual follow-up.  Hypertension-blood pressure currently on the low side.  He  is on Coreg and Hyzaar.  Will defer to his primary Dr. Ouida Sills for management of that.  Plan: Review blood pressure medications with primary MD Return in 6 months with CT angio of chest  Melrose Nakayama, MD [facility] [phone]

## 2022-05-01 ENCOUNTER — Other Ambulatory Visit: Payer: Self-pay | Admitting: Thoracic Surgery (Cardiothoracic Vascular Surgery)

## 2022-05-01 DIAGNOSIS — I7121 Aneurysm of the ascending aorta, without rupture: Secondary | ICD-10-CM

## 2022-06-06 ENCOUNTER — Ambulatory Visit (INDEPENDENT_AMBULATORY_CARE_PROVIDER_SITE_OTHER): Payer: Medicare Other | Admitting: Dermatology

## 2022-06-06 VITALS — BP 131/70

## 2022-06-06 DIAGNOSIS — Z1283 Encounter for screening for malignant neoplasm of skin: Secondary | ICD-10-CM

## 2022-06-06 DIAGNOSIS — L570 Actinic keratosis: Secondary | ICD-10-CM | POA: Diagnosis not present

## 2022-06-06 DIAGNOSIS — D229 Melanocytic nevi, unspecified: Secondary | ICD-10-CM

## 2022-06-06 DIAGNOSIS — D1801 Hemangioma of skin and subcutaneous tissue: Secondary | ICD-10-CM

## 2022-06-06 DIAGNOSIS — L578 Other skin changes due to chronic exposure to nonionizing radiation: Secondary | ICD-10-CM | POA: Diagnosis not present

## 2022-06-06 DIAGNOSIS — L814 Other melanin hyperpigmentation: Secondary | ICD-10-CM

## 2022-06-06 DIAGNOSIS — L720 Epidermal cyst: Secondary | ICD-10-CM | POA: Diagnosis not present

## 2022-06-06 DIAGNOSIS — L820 Inflamed seborrheic keratosis: Secondary | ICD-10-CM

## 2022-06-06 DIAGNOSIS — L821 Other seborrheic keratosis: Secondary | ICD-10-CM

## 2022-06-06 NOTE — Progress Notes (Signed)
Follow-Up Visit   Subjective  Calvin Roth is a 77 y.o. male who presents for the following: Annual Exam (History of dysplastic nevi - The patient presents for Total-Body Skin Exam (TBSE) for skin cancer screening and mole check.  The patient has spots, moles and lesions to be evaluated, some may be new or changing and the patient has concerns that these could be cancer./).  The following portions of the chart were reviewed this encounter and updated as appropriate:   Tobacco  Allergies  Meds  Problems  Med Hx  Surg Hx  Fam Hx     Review of Systems:  No other skin or systemic complaints except as noted in HPI or Assessment and Plan.  Objective  Well appearing patient in no apparent distress; mood and affect are within normal limits.  A full examination was performed including scalp, head, eyes, ears, nose, lips, neck, chest, axillae, abdomen, back, buttocks, bilateral upper extremities, bilateral lower extremities, hands, feet, fingers, toes, fingernails, and toenails. All findings within normal limits unless otherwise noted below.  Mid Back x 1, left preauricular x 1 (2) Erythematous stuck-on, waxy papule or plaque  Left clavicle 1.0 cm Subcutaneous nodule.   Scalp, face (9) Erythematous thin papules/macules with gritty scale.    Assessment & Plan   Lentigines - Scattered tan macules - Due to sun exposure - Benign-appearing, observe - Recommend daily broad spectrum sunscreen SPF 30+ to sun-exposed areas, reapply every 2 hours as needed. - Call for any changes  Seborrheic Keratoses - Stuck-on, waxy, tan-brown papules and/or plaques  - Benign-appearing - Discussed benign etiology and prognosis. - Observe - Call for any changes  Melanocytic Nevi - Tan-brown and/or pink-flesh-colored symmetric macules and papules - Benign appearing on exam today - Observation - Call clinic for new or changing moles - Recommend daily use of broad spectrum spf 30+ sunscreen to  sun-exposed areas.   Hemangiomas - Red papules - Discussed benign nature - Observe - Call for any changes  Actinic Damage - Chronic condition, secondary to cumulative UV/sun exposure - diffuse scaly erythematous macules with underlying dyspigmentation - Recommend daily broad spectrum sunscreen SPF 30+ to sun-exposed areas, reapply every 2 hours as needed.  - Staying in the shade or wearing long sleeves, sun glasses (UVA+UVB protection) and wide brim hats (4-inch brim around the entire circumference of the hat) are also recommended for sun protection.  - Call for new or changing lesions.  Skin cancer screening performed today.  Inflamed seborrheic keratosis (2) Mid Back x 1, left preauricular x 1  Destruction of lesion - Mid Back x 1, left preauricular x 1 Complexity: simple   Destruction method: cryotherapy   Informed consent: discussed and consent obtained   Timeout:  patient name, date of birth, surgical site, and procedure verified Lesion destroyed using liquid nitrogen: Yes   Region frozen until ice ball extended beyond lesion: Yes   Outcome: patient tolerated procedure well with no complications   Post-procedure details: wound care instructions given    Epidermal inclusion cyst Left clavicle Benign-appearing.  Observation.  Call clinic for new or changing lesions.  Recommend daily use of broad spectrum spf 30+ sunscreen to sun-exposed areas.   AK (actinic keratosis) (9) Scalp, face Destruction of lesion - Scalp, face Complexity: simple   Destruction method: cryotherapy   Informed consent: discussed and consent obtained   Timeout:  patient name, date of birth, surgical site, and procedure verified Lesion destroyed using liquid nitrogen: Yes  Region frozen until ice ball extended beyond lesion: Yes   Outcome: patient tolerated procedure well with no complications   Post-procedure details: wound care instructions given    Return in about 1 year (around 06/06/2023) for  TBSE.  I, Ashok Cordia, CMA, am acting as scribe for Sarina Ser, MD . Documentation: I have reviewed the above documentation for accuracy and completeness, and I agree with the above.  Sarina Ser, MD

## 2022-06-06 NOTE — Patient Instructions (Signed)
 Cryotherapy Aftercare  Wash gently with soap and water everyday.   Apply Vaseline and Band-Aid daily until healed. Due to recent changes in healthcare laws, you may see results of your pathology and/or laboratory studies on MyChart before the doctors have had a chance to review them. We understand that in some cases there may be results that are confusing or concerning to you. Please understand that not all results are received at the same time and often the doctors may need to interpret multiple results in order to provide you with the best plan of care or course of treatment. Therefore, we ask that you please give us 2 business days to thoroughly review all your results before contacting the office for clarification. Should we see a critical lab result, you will be contacted sooner.   If You Need Anything After Your Visit  If you have any questions or concerns for your doctor, please call our main line at [phone] and press option 4 to reach your doctor's medical assistant. If no one answers, please leave a voicemail as directed and we will return your call as soon as possible. Messages left after 4 pm will be answered the following business day.   You may also send us a message via MyChart. We typically respond to MyChart messages within 1-2 business days.  For prescription refills, please ask your pharmacy to contact our office. Our fax number is [phone].  If you have an urgent issue when the clinic is closed that cannot wait until the next business day, you can page your doctor at the number below.    Please note that while we do our best to be available for urgent issues outside of office hours, we are not available 24/7.   If you have an urgent issue and are unable to reach us, you may choose to seek medical care at your doctor's office, retail clinic, urgent care center, or emergency room.  If you have a medical emergency, please immediately call 911 or go to the emergency  department.  Pager Numbers  - Dr. Kowalski: [phone]  - Dr. Moye: [phone]  - Dr. Stewart: [phone]  In the event of inclement weather, please call our main line at [phone] for an update on the status of any delays or closures.  Dermatology Medication Tips: Please keep the boxes that topical medications come in in order to help keep track of the instructions about where and how to use these. Pharmacies typically print the medication instructions only on the boxes and not directly on the medication tubes.   If your medication is too expensive, please contact our office at [phone] option 4 or send us a message through MyChart.   We are unable to tell what your co-pay for medications will be in advance as this is different depending on your insurance coverage. However, we may be able to find a substitute medication at lower cost or fill out paperwork to get insurance to cover a needed medication.   If a prior authorization is required to get your medication covered by your insurance company, please allow us 1-2 business days to complete this process.  Drug prices often vary depending on where the prescription is filled and some pharmacies may offer cheaper prices.  The website www.goodrx.com contains coupons for medications through different pharmacies. The prices here do not account for what the cost may be with help from insurance (it may be cheaper with your insurance), but the website can give you the   price if you did not use any insurance.  - You can print the associated coupon and take it with your prescription to the pharmacy.  - You may also stop by our office during regular business hours and pick up a GoodRx coupon card.  - If you need your prescription sent electronically to a different pharmacy, notify our office through [facility] MyChart or by phone at [phone] option 4.     Si Usted Necesita Algo Despus de Su Visita  Tambin puede enviarnos un  mensaje a travs de MyChart. Por lo general respondemos a los mensajes de MyChart en el transcurso de 1 a 2 das hbiles.  Para renovar recetas, por favor pida a su farmacia que se ponga en contacto con nuestra oficina. Nuestro nmero de fax es el [phone].  Si tiene un asunto urgente cuando la clnica est cerrada y que no puede esperar hasta el siguiente da hbil, puede llamar/localizar a su doctor(a) al nmero que aparece a continuacin.   Por favor, tenga en cuenta que aunque hacemos todo lo posible para estar disponibles para asuntos urgentes fuera del horario de oficina, no estamos disponibles las 24 horas del da, los 7 das de la semana.   Si tiene un problema urgente y no puede comunicarse con nosotros, puede optar por buscar atencin mdica  en el consultorio de su doctor(a), en una clnica privada, en un centro de atencin urgente o en una sala de emergencias.  Si tiene una emergencia mdica, por favor llame inmediatamente al 911 o vaya a la sala de emergencias.  Nmeros de bper  - Dr. Kowalski: [phone]  - Dra. Moye: [phone]  - Dra. Stewart: [phone]  En caso de inclemencias del tiempo, por favor llame a nuestra lnea principal al [phone] para una actualizacin sobre el estado de cualquier retraso o cierre.  Consejos para la medicacin en dermatologa: Por favor, guarde las cajas en las que vienen los medicamentos de uso tpico para ayudarle a seguir las instrucciones sobre dnde y cmo usarlos. Las farmacias generalmente imprimen las instrucciones del medicamento slo en las cajas y no directamente en los tubos del medicamento.   Si su medicamento es muy caro, por favor, pngase en contacto con nuestra oficina llamando al [phone] y presione la opcin 4 o envenos un mensaje a travs de MyChart.   No podemos decirle cul ser su copago por los medicamentos por adelantado ya que esto es diferente dependiendo de la cobertura de su seguro. Sin embargo,  es posible que podamos encontrar un medicamento sustituto a menor costo o llenar un formulario para que el seguro cubra el medicamento que se considera necesario.   Si se requiere una autorizacin previa para que su compaa de seguros cubra su medicamento, por favor permtanos de 1 a 2 das hbiles para completar este proceso.  Los precios de los medicamentos varan con frecuencia dependiendo del lugar de dnde se surte la receta y alguna farmacias pueden ofrecer precios ms baratos.  El sitio web www.goodrx.com tiene cupones para medicamentos de diferentes farmacias. Los precios aqu no tienen en cuenta lo que podra costar con la ayuda del seguro (puede ser ms barato con su seguro), pero el sitio web puede darle el precio si no utiliz ningn seguro.  - Puede imprimir el cupn correspondiente y llevarlo con su receta a la farmacia.  - Tambin puede pasar por nuestra oficina durante el horario de atencin regular y recoger una tarjeta de cupones de GoodRx.  - Si necesita que   su receta se enve electrnicamente a una farmacia diferente, informe a nuestra oficina a travs de MyChart de [facility] o por telfono llamando al [phone] y presione la opcin 4.  

## 2022-06-10 ENCOUNTER — Encounter: Payer: Self-pay | Admitting: Dermatology

## 2022-06-26 ENCOUNTER — Ambulatory Visit
Admission: RE | Admit: 2022-06-26 | Discharge: 2022-06-26 | Disposition: A | Discharge status: Home / Self Care | Payer: Medicare Other | Source: Ambulatory Visit | Attending: Thoracic Surgery (Cardiothoracic Vascular Surgery) | Admitting: Thoracic Surgery (Cardiothoracic Vascular Surgery)

## 2022-06-26 DIAGNOSIS — I7121 Aneurysm of the ascending aorta, without rupture: Secondary | ICD-10-CM | POA: Diagnosis present

## 2022-06-26 LAB — POCT I-STAT CREATININE: Creatinine, Ser: 1.4 mg/dL — ABNORMAL HIGH (ref 0.61–1.24)

## 2022-06-26 MED ORDER — IOHEXOL 350 MG/ML SOLN
75.0000 mL | Freq: Once | INTRAVENOUS | Status: AC | PRN
  Administered 2022-06-26: 75 mL via INTRAVENOUS

## 2022-07-03 ENCOUNTER — Ambulatory Visit (INDEPENDENT_AMBULATORY_CARE_PROVIDER_SITE_OTHER): Payer: Medicare Other | Admitting: Thoracic Surgery (Cardiothoracic Vascular Surgery)

## 2022-07-03 ENCOUNTER — Encounter: Payer: Self-pay | Admitting: Thoracic Surgery (Cardiothoracic Vascular Surgery)

## 2022-07-03 VITALS — BP 128/70 | HR 87 | Resp 20 | Ht 71.0 in | Wt 216.0 lb

## 2022-07-03 DIAGNOSIS — I7121 Aneurysm of the ascending aorta, without rupture: Secondary | ICD-10-CM | POA: Diagnosis not present

## 2022-07-03 NOTE — Progress Notes (Signed)
[address]       , [id number]             [phone]     HPI: Mr. Calvin Roth returns for a scheduled follow-up visit regarding his ascending aneurysm.  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  Has been noted to have 4.5 cm ascending aneurysm and coronary calcification on CTs of the chest going back several years.  In February 2023 he had a new groundglass opacity in the right lower lobe measuring 3 x 1.7 cm.  That completely resolved.  I last saw him in September 2023.  He was doing well at that time.  He continues to do well.  He has no complaints.  No chest pain, pressure, tightness, or shortness of breath.  No unusual cough.  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 Moderate to marked atypia, edge involved. Excised 09/12/2006, margins free.   Dysplastic nevus 11/19/2007   Left costal margin/infrapectoral. Slight to moderate atypia, margin focally involved.   Dysplastic nevus 11/19/2007   Right lateral flank near waistline. Moderate atypia, extends to one edge.    Dysplastic nevus 05/25/2019   Left lateral tricep. Moderate atypia, deep margin involved.    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     carvedilol (COREG) 6.25 MG tablet Take 6.25 mg by mouth 2 (two) times daily.     dorzolamide-timolol (COSOPT) 22.3-6.8 MG/ML ophthalmic solution Place 1 drop into the right eye 2 (two) times daily.     gabapentin (NEURONTIN) 400 MG capsule Take 1,200 mg by mouth at bedtime.     LEVEMIR FLEXTOUCH 100 UNIT/ML Pen Inject 15 Units into the skin in the morning.  11   losartan (COZAAR) 50 MG tablet Take 50 mg by mouth daily.     metFORMIN (GLUCOPHAGE-XR) 500 MG 24 hr tablet Take 1,000 mg by mouth 2 (two) times daily.      Multiple Vitamin (MULTIVITAMIN WITH MINERALS) TABS tablet Take 1 tablet by mouth in the morning. Multivitamin for Seniors     omeprazole (PRILOSEC) 20 MG capsule Take 20 mg by mouth in the morning.     oxymetazoline (AFRIN) 0.05 % nasal spray Place 1 spray into both nostrils in the morning.     tamsulosin (FLOMAX) 0.4 MG CAPS capsule Take 1 capsule (0.4 mg  total) by mouth daily. (Patient taking differently: Take 0.4 mg by mouth every evening.) 30 capsule 1   tirzepatide (MOUNJARO) 10 MG/0.5ML Pen Inject into the skin.     losartan-hydrochlorothiazide (HYZAAR) 100-12.5 MG tablet Take 1 tablet by mouth in the morning. (Patient not taking: Reported on 07/03/2022)     No current facility-administered medications for this visit.    Physical Exam BP 128/70   Pulse 87   Resp 20   Ht 5\' 11"  (1.803 m)   Wt 216 lb (98 kg)   SpO2 95% Comment: RA  BMI 30.54 kg/m  77 year old man in no acute distress Well-developed and well-nourished Alert and oriented x 3 with no focal deficits Lungs clear bilaterally Cardiac regular rate and rhythm with normal S1 and S2 no rubs murmurs  or gallops  Diagnostic Tests: CT ANGIOGRAPHY CHEST WITH CONTRAST   TECHNIQUE: Noncontrast CT was initially obtained. Multidetector CT imaging of the chest was performed using the standard protocol during bolus administration of intravenous contrast. Multiplanar CT image reconstructions and MIPs were obtained to evaluate the vascular anatomy.   RADIATION DOSE REDUCTION: This exam was performed according to the departmental dose-optimization program which includes automated exposure control, adjustment of the mA and/or kV according to patient size and/or use of iterative reconstruction technique.   CONTRAST:  41mL OMNIPAQUE IOHEXOL 350 MG/ML SOLN   COMPARISON:  11/23/2021   FINDINGS: Cardiovascular: SVC patent. Heart size normal. No pericardial effusion. The RV is nondilated. Satisfactory opacification of pulmonary arteries noted, and there is no evidence of pulmonary emboli. Extensive coronary calcifications. Good contrast opacification of the thoracic aorta, without dissection or stenosis. Bovine variant brachiocephalic arterial origin anatomy without proximal stenosis.   Aortic Root:   --Valve: 3.2 cm   --Sinuses: 4.9 cm   --Sinotubular Junction: 4.2 cm   Limitations by motion: None   Thoracic Aorta:   --Ascending Aorta: 4.6 cm (stable since 11/23/2021 by my measurement)   --Aortic Arch: 3.4 cm   --Descending Aorta: 3.1 cm   Mediastinum/Nodes: Calcified right hilar and subcarinal subcentimeter lymph nodes as before. No mass or adenopathy.   Lungs/Pleura: No pleural effusion. No pneumothorax. Stable subcentimeter calcified granulomas in the right lower lobe. 5 mm pleural based nodule, lateral right lower lobe (Im77,Se7) , stable since 10/31/2017 consistent with benign process; no follow-up required. Lungs otherwise clear.   Upper Abdomen: Cholecystectomy clips. Chronic marked pancreatic parenchymal atrophy with coarse calcifications. No acute findings.    Musculoskeletal: No chest wall abnormality. No acute or significant osseous findings.   Review of the MIP images confirms the above findings.   IMPRESSION: 1. Stable 4.6 cm ascending thoracic aortic aneurysm without complicating features. Recommend semi-annual imaging followup by CTA or MRA and referral to cardiothoracic surgery if not already obtained. This recommendation follows 2010 ACCF/AHA/AATS/ACR/ASA/SCA/SCAI/SIR/STS/SVM Guidelines for the Diagnosis and Management of Patients With Thoracic Aortic Disease. Circulation. 2010; 121: [id number] 2. Old granulomatous disease. 3. Chronic pancreatic parenchymal atrophy.     Electronically Signed   By: Lucrezia Europe M.D.   On: 06/26/2022 16:24 I personally reviewed the CT images.  Aortic root measures about 4.7 to 4.8 cm.  Ascending aorta about 4.5 to 4.6 cm.  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  Ascending aneurysm-stable.  Needs continued semiannual follow-up.  Hypertension-well-controlled on current regimen.  Coronary calcification-has  been present on CT dating back several years.  No anginal symptoms.  On statin.  Plan: Return in 6 months with CT chest  Melrose Nakayama, MD [facility] [phone]

## 2022-10-29 ENCOUNTER — Ambulatory Visit: Payer: Medicare Other

## 2022-10-29 DIAGNOSIS — M6281 Muscle weakness (generalized): Secondary | ICD-10-CM | POA: Diagnosis present

## 2022-10-29 DIAGNOSIS — R2681 Unsteadiness on feet: Secondary | ICD-10-CM | POA: Diagnosis present

## 2022-10-29 DIAGNOSIS — R269 Unspecified abnormalities of gait and mobility: Secondary | ICD-10-CM | POA: Diagnosis present

## 2022-10-29 DIAGNOSIS — R278 Other lack of coordination: Secondary | ICD-10-CM | POA: Insufficient documentation

## 2022-10-29 DIAGNOSIS — R262 Difficulty in walking, not elsewhere classified: Secondary | ICD-10-CM | POA: Insufficient documentation

## 2022-10-29 NOTE — Therapy (Signed)
OUTPATIENT PHYSICAL THERAPY NEURO EVALUATION   Patient Name: Calvin Roth MRN: [id number] DOB:[birth year], 77 y.o., male Today's Date: 10/30/2022   PCP: Dr. Einar Crow REFERRING PROVIDER: Dr. Einar Crow  END OF SESSION:  PT End of Session - 10/29/22 1326     Visit Number 1    Number of Visits 24    Date for PT Re-Evaluation 01/21/23    Progress Note Due on Visit 10    PT Start Time 1315    PT Stop Time 1405    PT Time Calculation (min) 50 min    Equipment Utilized During Treatment Gait belt    Activity Tolerance Patient tolerated treatment well    Behavior During Therapy [facility] for tasks assessed/performed             Past Medical History:  Diagnosis Date   Actinic keratosis    Aortic atherosclerosis (HCC)    Aortic root dilatation (HCC) 03/12/2019   a.) CTA 03/22/2019 --> 4.5 cm   Ascending aortic aneurysm (HCC) 10/31/2017   a.) fusiform; measured 4.4 cm   BPH (benign prostatic hyperplasia)    CKD (chronic kidney disease), stage III (HCC)    Coronary artery disease    DDD (degenerative disc disease), lumbar    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 Moderate to marked atypia, edge involved. Excised 09/12/2006, margins free.   Dysplastic nevus 11/19/2007   Left costal margin/infrapectoral. Slight to moderate atypia, margin focally involved.   Dysplastic nevus 11/19/2007   Right lateral flank near waistline. Moderate atypia, extends to one edge.    Dysplastic nevus 05/25/2019   Left lateral tricep. Moderate atypia, deep margin involved.    GERD (gastroesophageal reflux disease)    Hepatic steatosis    Hyperlipidemia    Hypertension    Intermittent alternating exotropia    Neurogenic bladder    a.) self catheriterizes FOUR times daily   Osteoarthritis    Serrated adenoma of colon 07/17/2016   Sliding hiatal hernia    T2DM (type 2 diabetes mellitus) (HCC)    Past Surgical History:   Procedure Laterality Date   CATARACT EXTRACTION Left 05/15/2018   CATARACT EXTRACTION Right 04/03/2018   CHOLECYSTECTOMY     COLONOSCOPY WITH PROPOFOL N/A 07/17/2016   Procedure: COLONOSCOPY WITH PROPOFOL;  Surgeon: Christena Deem, MD;  Location: [facility] ENDOSCOPY;  Service: Endoscopy;  Laterality: N/A;   COLONOSCOPY WITH PROPOFOL N/A 02/04/2018   Procedure: COLONOSCOPY WITH PROPOFOL;  Surgeon: Christena Deem, MD;  Location: [facility] ENDOSCOPY;  Service: Endoscopy;  Laterality: N/A;   EYE SURGERY Left 2000   Strabismus correction   FOREIGN BODY REMOVAL Left 10/23/2019   Procedure: REMOVAL FOREIGN BODY LEFT HEEL;  Surgeon: Gwyneth Revels, DPM;  Location: [facility] ORS;  Service: Podiatry;  Laterality: Left;   GASTRIC RESTRICTION SURGERY N/A 1982   "stomach stapled for weight loss"   LUMBAR FUSION N/A 02/10/2020   Procedure: PLIF,IP,PSI L2- L3;EXPL FUSION; Location: [facility]; Surgeon: Tressie Stalker, MD   POSTERIOR LUMBAR FUSION N/A 05/04/2015   Procedure: LUMBAR THREE-FOUR, LUMBAR FOUR-FIVE POSTERIOR LUMBAR FUSION; Location: [facility]; Surgeon: Tressie Stalker, MD   TONSILLECTOMY Bilateral 1954   TRANSANAL EXCISION OF RECTAL MASS N/A 09/18/2016   Procedure: TRANSANAL EXCISION OF RECTAL TUMOR;  Surgeon: Kieth Brightly, MD;  Location: [facility] ORS;  Service: General;  Laterality: N/A;   ULNAR COLLATERAL LIGAMENT REPAIR Left 12/28/2020   Procedure: LEFT THUMB ULNAR  COLLATERAL LIGAMENT REPAIR/RECONSTRUCTION;  Surgeon: Christena Flake, MD;  Location: [facility] ORS;  Service: Orthopedics;  Laterality: Left;   Patient Active Problem List   Diagnosis Date Noted   Spondylolisthesis of lumbar region 05/04/2015   Degeneration of intervertebral disc of lumbar region 10/19/2014   Neuritis or radiculitis due to rupture of lumbar intervertebral disc 10/19/2014   Lumbar canal stenosis 10/19/2014   Morbid obesity (HCC) 01/23/2014   Benign prostatic hyperplasia with urinary obstruction  10/01/2013   Type 2 diabetes mellitus (HCC) 10/01/2013   Benign hypertension 10/01/2013   Spinal stenosis 10/01/2013    ONSET DATE: around July 2023  REFERRING DIAG: R27.0 (ICD-10-CM) - Ataxia, unspecified   THERAPY DIAG:  Abnormality of gait and mobility  Difficulty in walking, not elsewhere classified  Muscle weakness (generalized)  Unsteadiness on feet  Other lack of coordination  Rationale for Evaluation and Treatment: Rehabilitation  SUBJECTIVE:                                                                                                                                                                                             SUBJECTIVE STATEMENT: Patient reports having some balance issues- Difficulty standing on 1 foot. About 1 year ago- was having some issues with BP- lightheadiness. Denies any recent- Does endorse some unbalance- wide base of support. States wife and family noticed he has been less stable on his feet. Started around 01/2022. Reports lost over 50 lb. In past year.    Pt accompanied by: self  PERTINENT HISTORY: Past Medical History:  Diagnosis Date  Diabetes mellitus type 2, uncomplicated (CMS/HHS-HCC)  Hyperlipidemia  Hypertension  Serrated adenoma of colon 07/17/2016   Past Surgical History:  Procedure Laterality Date  COLONOSCOPY 07/17/2016  Serrated adenoma/Repeat 33yr/MUS  COLONOSCOPY 02/04/2018  Tubular adenoma of the colon/Hyperplastic colon polyp/Repeat 16yrs/MUS  CATARACT EXTRACTION W/ INTRAOCULAR LENS IMPLANT Right 04/2018  LENS EYE SURGERY Right 04/03/2018  standard monofocal  CATARACT EXTRACTION Left 05/2018  LENS EYE SURGERY Left 05/15/2018  back surgery 02/10/2020  fused L2 and L3  Primary repair of ulnar collateral ligament with internal brace augmentation, left thumb Left 12/28/2020  Dr.Poggi  COLONOSCOPY 01/25/2022  PHxCP/normal colon/no repeat/CTL  CHOLECYSTECTOMY  COLONOSCOPY 04/15/2004, 07/13/2009  Correction of  strabismus, left eye  EXPLORATION OF SPINAL FUSION  spinal fusion 05/2015  Gastric stapling  TONSILLECTOMY    PAIN:  Are you having pain? No  PRECAUTIONS: Fall  RED FLAGS: None   WEIGHT BEARING RESTRICTIONS: No  FALLS: Has patient fallen in last 6 months? No  LIVING ENVIRONMENT: Lives with: lives with their spouse Lives in: House/apartment Stairs: Yes: Internal:  10-14 steps; on right going up and External: 5 steps; on left going up Has following equipment at home: None  PLOF: Independent  PATIENT GOALS: I would like to be more stable and stand on 1 foot and have better balance. I want to learn ways to regain balance  OBJECTIVE:   DIAGNOSTIC FINDINGS: none recent  COGNITION: Overall cognitive status: Within functional limits for tasks assessed   SENSATION: WFL- in all extremities  COORDINATION: Intact with heel to shin BLE  EDEMA:  None observed  LOWER EXTREMITY MMT:    MMT Right Eval Left Eval  Hip flexion 4 4  Hip extension 4 4  Hip abduction 4 4  Hip adduction 4 4  Hip internal rotation 4 4  Hip external rotation 4 4  Knee flexion 4 4  Knee extension 4 4  Ankle dorsiflexion 4 4  Ankle plantarflexion    Ankle inversion 4 4  Ankle eversion 4 4  (Blank rows = not tested)   TRANSFERS: Assistive device utilized: None  Sit to stand: Complete Independence Stand to sit: Complete Independence Chair to chair: Complete Independence Floor:  not tested   GAIT: Gait pattern: decreased arm swing- Right, decreased arm swing- Left, decreased step length- Right, and decreased step length- Left Distance walked: 100+ Assistive device utilized: None Level of assistance: Complete Independence  FUNCTIONAL TESTS:  5 times sit to stand: 16.01 sec without UE support Timed up and go (TUG): 10.91 sec without UE support 10 meter walk test: 1.0 m/s Berg Balance Scale: 50/56  PATIENT SURVEYS:  FOTO 58 with goal of 17  TODAY'S TREATMENT:                                                                                                                               DATE: 10/30/22  BP= 150/75 mmHg Right arm Sitting at rest   Physical Therapy Evaluation performed today.    PATIENT EDUCATION: Education details: Plan of care; Purpose of functional outcomes Person educated: Patient Education method: Explanation Education comprehension: verbalized understanding  HOME EXERCISE PROGRAM: Access Code: [email or web address] URL: [email or web address] Date: 10/29/2022 Prepared by: Maureen Ralphs  Exercises - Single Leg Stance  - 3 x weekly - 5-10 sets - as long as possible-  hold - Tandem Stance  - 3 x weekly - 3 sets - as long as possible hold  GOALS: Goals reviewed with patient? Yes  SHORT TERM GOALS: Target date: 12/10/2022  Patient will be independent with HEP in order to improve strength and balance in order to decrease fall risk and improve function at home and work.   Baseline: EVAL- No formal HEP in place Goal status: INITIAL  LONG TERM GOALS: Target date: 01/21/2023  1.  Patient (> 12 years old) will complete five times sit to stand test in < 15 seconds indicating an increased LE strength and improved balance. Baseline: EVAL=58 Goal status: INITIAL  2.  Patient will increase FOTO score to  equal to or greater than  59   to demonstrate statistically significant improvement in mobility and quality of life.  Baseline: EVAL= 58 Goal status: INITIAL   3.  Patient will increase Berg Balance score by > 3 points to demonstrate decreased fall risk during functional activities. Baseline: EVAL=50/56 Goal status: INITIAL    .    4.   Patient will demonstrate improved dynamic standing balance as seen by single leg standing > 10 sec each LE  Baseline: EVAL=2-3 sec each LE Goal status: INITIAL  ASSESSMENT:  CLINICAL IMPRESSION: Patient is a 77 y.o. male who was seen today for physical therapy evaluation and treatment for diagnosis of  ataxia bilateral LE. He presents today with the following- Bilateral LE muscle weakness scoring 4/5 with MMT; impaired balance- scoring 50/56 on BERG and difficulty with tandem and single leg standing. Will further assess his dynamic balance and administer DGI or FGA next session. Patient will benefit from skilled PT services to improve his functional strength/mobility and balance for improved steadiness with all household and community outings and improved quality of life.   OBJECTIVE IMPAIRMENTS: decreased balance, decreased coordination, difficulty walking, decreased strength, and postural dysfunction.   ACTIVITY LIMITATIONS: carrying, lifting, bending, standing, and squatting  PARTICIPATION LIMITATIONS: community activity and yard work  PERSONAL FACTORS: 1-2 comorbidities: HTN; DM  are also affecting patient's functional outcome.   REHAB POTENTIAL: Good  CLINICAL DECISION MAKING: Stable/uncomplicated  EVALUATION COMPLEXITY: Low  PLAN:  PT FREQUENCY: 1-2x/week  PT DURATION: 12 weeks  PLANNED INTERVENTIONS: Therapeutic exercises, Therapeutic activity, Neuromuscular re-education, Balance training, Gait training, Patient/Family education, Self Care, Joint mobilization, Joint manipulation, Stair training, Vestibular training, Canalith repositioning, DME instructions, Dry Needling, Electrical stimulation, Spinal manipulation, Spinal mobilization, Cryotherapy, Moist heat, Taping, Manual therapy, and Re-evaluation  PLAN FOR NEXT SESSION: Continue with balance training; Add handout for SLS/Tandem standing; Assess more dynamic balance- score DGI or FGA and add goal as appropriate. Progress HEP    Lenda Kelp, PT 10/30/2022, 8:20 AM

## 2022-11-02 ENCOUNTER — Encounter: Payer: Self-pay | Admitting: Physical Therapy

## 2022-11-02 ENCOUNTER — Ambulatory Visit: Payer: Medicare Other | Attending: Internal Medicine | Admitting: Physical Therapy

## 2022-11-02 DIAGNOSIS — R262 Difficulty in walking, not elsewhere classified: Secondary | ICD-10-CM | POA: Diagnosis present

## 2022-11-02 DIAGNOSIS — R278 Other lack of coordination: Secondary | ICD-10-CM | POA: Insufficient documentation

## 2022-11-02 DIAGNOSIS — M6281 Muscle weakness (generalized): Secondary | ICD-10-CM | POA: Insufficient documentation

## 2022-11-02 DIAGNOSIS — R269 Unspecified abnormalities of gait and mobility: Secondary | ICD-10-CM | POA: Insufficient documentation

## 2022-11-02 DIAGNOSIS — R2681 Unsteadiness on feet: Secondary | ICD-10-CM | POA: Diagnosis present

## 2022-11-02 NOTE — Therapy (Signed)
OUTPATIENT PHYSICAL THERAPY NEURO TREATMENT   Patient Name: Calvin Roth MRN: [id number] DOB:07-May-1945, 77 y.o., male Today's Date: 11/08/2022   PCP: Dr. Einar Crow REFERRING PROVIDER: Dr. Einar Crow  END OF SESSION:  PT End of Session - 11/08/22 0745     Visit Number 2    Number of Visits 24    Date for PT Re-Evaluation 01/21/23    Progress Note Due on Visit 10    PT Start Time 0934    PT Stop Time 1014    PT Time Calculation (min) 40 min    Equipment Utilized During Treatment Gait belt    Activity Tolerance Patient tolerated treatment well    Behavior During Therapy [facility] for tasks assessed/performed              Past Medical History:  Diagnosis Date   Actinic keratosis    Aortic atherosclerosis (HCC)    Aortic root dilatation (HCC) 03/12/2019   a.) CTA 03/22/2019 --> 4.5 cm   Ascending aortic aneurysm (HCC) 10/31/2017   a.) fusiform; measured 4.4 cm   BPH (benign prostatic hyperplasia)    CKD (chronic kidney disease), stage III (HCC)    Coronary artery disease    DDD (degenerative disc disease), lumbar    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 Moderate to marked atypia, edge involved. Excised 09/12/2006, margins free.   Dysplastic nevus 11/19/2007   Left costal margin/infrapectoral. Slight to moderate atypia, margin focally involved.   Dysplastic nevus 11/19/2007   Right lateral flank near waistline. Moderate atypia, extends to one edge.    Dysplastic nevus 05/25/2019   Left lateral tricep. Moderate atypia, deep margin involved.    GERD (gastroesophageal reflux disease)    Hepatic steatosis    Hyperlipidemia    Hypertension    Intermittent alternating exotropia    Neurogenic bladder    a.) self catheriterizes FOUR times daily   Osteoarthritis    Serrated adenoma of colon 07/17/2016   Sliding hiatal hernia    T2DM (type 2 diabetes mellitus) (HCC)    Past Surgical History:   Procedure Laterality Date   CATARACT EXTRACTION Left 05/15/2018   CATARACT EXTRACTION Right 04/03/2018   CHOLECYSTECTOMY     COLONOSCOPY WITH PROPOFOL N/A 07/17/2016   Procedure: COLONOSCOPY WITH PROPOFOL;  Surgeon: Christena Deem, MD;  Location: [facility] ENDOSCOPY;  Service: Endoscopy;  Laterality: N/A;   COLONOSCOPY WITH PROPOFOL N/A 02/04/2018   Procedure: COLONOSCOPY WITH PROPOFOL;  Surgeon: Christena Deem, MD;  Location: [facility] ENDOSCOPY;  Service: Endoscopy;  Laterality: N/A;   EYE SURGERY Left 2000   Strabismus correction   FOREIGN BODY REMOVAL Left 10/23/2019   Procedure: REMOVAL FOREIGN BODY LEFT HEEL;  Surgeon: Gwyneth Revels, DPM;  Location: [facility] ORS;  Service: Podiatry;  Laterality: Left;   GASTRIC RESTRICTION SURGERY N/A 1982   "stomach stapled for weight loss"   LUMBAR FUSION N/A 02/10/2020   Procedure: PLIF,IP,PSI L2- L3;EXPL FUSION; Location: [facility]; Surgeon: Tressie Stalker, MD   POSTERIOR LUMBAR FUSION N/A 05/04/2015   Procedure: LUMBAR THREE-FOUR, LUMBAR FOUR-FIVE POSTERIOR LUMBAR FUSION; Location: [facility]; Surgeon: Tressie Stalker, MD   TONSILLECTOMY Bilateral 1954   TRANSANAL EXCISION OF RECTAL MASS N/A 09/18/2016   Procedure: TRANSANAL EXCISION OF RECTAL TUMOR;  Surgeon: Kieth Brightly, MD;  Location: [facility] ORS;  Service: General;  Laterality: N/A;   ULNAR COLLATERAL LIGAMENT REPAIR Left 12/28/2020   Procedure: LEFT THUMB  ULNAR COLLATERAL LIGAMENT REPAIR/RECONSTRUCTION;  Surgeon: Christena Flake, MD;  Location: [facility] ORS;  Service: Orthopedics;  Laterality: Left;   Patient Active Problem List   Diagnosis Date Noted   Spondylolisthesis of lumbar region 05/04/2015   Degeneration of intervertebral disc of lumbar region 10/19/2014   Neuritis or radiculitis due to rupture of lumbar intervertebral disc 10/19/2014   Lumbar canal stenosis 10/19/2014   Morbid obesity (HCC) 01/23/2014   Benign prostatic hyperplasia with urinary obstruction  10/01/2013   Type 2 diabetes mellitus (HCC) 10/01/2013   Benign hypertension 10/01/2013   Spinal stenosis 10/01/2013    ONSET DATE: around July 2023  REFERRING DIAG: R27.0 (ICD-10-CM) - Ataxia, unspecified   THERAPY DIAG:  Abnormality of gait and mobility  Difficulty in walking, not elsewhere classified  Muscle weakness (generalized)  Unsteadiness on feet  Rationale for Evaluation and Treatment: Rehabilitation  SUBJECTIVE:                                                                                                                                                                                             SUBJECTIVE STATEMENT: Patient reports no falls or loss of balance since last session.  Patient reports some compliance with HEP but not doing as much as he should.  Patient will be out for the next 2 weeks vacation.   Pt accompanied by: self  PERTINENT HISTORY: Past Medical History:  Diagnosis Date  Diabetes mellitus type 2, uncomplicated (CMS/HHS-HCC)  Hyperlipidemia  Hypertension  Serrated adenoma of colon 07/17/2016   Past Surgical History:  Procedure Laterality Date  COLONOSCOPY 07/17/2016  Serrated adenoma/Repeat 34yr/MUS  COLONOSCOPY 02/04/2018  Tubular adenoma of the colon/Hyperplastic colon polyp/Repeat 66yrs/MUS  CATARACT EXTRACTION W/ INTRAOCULAR LENS IMPLANT Right 04/2018  LENS EYE SURGERY Right 04/03/2018  standard monofocal  CATARACT EXTRACTION Left 05/2018  LENS EYE SURGERY Left 05/15/2018  back surgery 02/10/2020  fused L2 and L3  Primary repair of ulnar collateral ligament with internal brace augmentation, left thumb Left 12/28/2020  Dr.Poggi  COLONOSCOPY 01/25/2022  PHxCP/normal colon/no repeat/CTL  CHOLECYSTECTOMY  COLONOSCOPY 04/15/2004, 07/13/2009  Correction of strabismus, left eye  EXPLORATION OF SPINAL FUSION  spinal fusion 05/2015  Gastric stapling  TONSILLECTOMY    PAIN:  Are you having pain? No  PRECAUTIONS: Fall  RED  FLAGS: None   WEIGHT BEARING RESTRICTIONS: No  FALLS: Has patient fallen in last 6 months? No  LIVING ENVIRONMENT: Lives with: lives with their spouse Lives in: House/apartment Stairs: Yes: Internal: 10-14 steps; on right going up and External: 5 steps; on left going up Has following equipment at home: None  PLOF: Independent  PATIENT  GOALS: I would like to be more stable and stand on 1 foot and have better balance. I want to learn ways to regain balance  OBJECTIVE:   DIAGNOSTIC FINDINGS: none recent  COGNITION: Overall cognitive status: Within functional limits for tasks assessed   SENSATION: WFL- in all extremities  COORDINATION: Intact with heel to shin BLE  EDEMA:  None observed  LOWER EXTREMITY MMT:    MMT Right Eval Left Eval  Hip flexion 4 4  Hip extension 4 4  Hip abduction 4 4  Hip adduction 4 4  Hip internal rotation 4 4  Hip external rotation 4 4  Knee flexion 4 4  Knee extension 4 4  Ankle dorsiflexion 4 4  Ankle plantarflexion    Ankle inversion 4 4  Ankle eversion 4 4  (Blank rows = not tested)   TRANSFERS: Assistive device utilized: None  Sit to stand: Complete Independence Stand to sit: Complete Independence Chair to chair: Complete Independence Floor:  not tested   GAIT: Gait pattern: decreased arm swing- Right, decreased arm swing- Left, decreased step length- Right, and decreased step length- Left Distance walked: 100+ Assistive device utilized: None Level of assistance: Complete Independence  FUNCTIONAL TESTS:  5 times sit to stand: 16.01 sec without UE support Timed up and go (TUG): 10.91 sec without UE support 10 meter walk test: 1.0 m/s Berg Balance Scale: 50/56  PATIENT SURVEYS:  FOTO 58 with goal of 16  TODAY'S TREATMENT:                                                                                                                              DATE: 11/08/22  Exercise/Activity Sets/ Reps/Time/ Resistance  Assistance Charge type Comments  DGI - 20 See flowsheet below for details   Neuro re-ed   Ladder drills for LE coordination.  X 4 cycles each  NMR Forward stepping, sidestepping, and stepping forward and backward out and then lateral step to the next.  Marching 10 x each  Neuro re-ed   Tandem stance 3 x 45 sec  Neuro re-ed   Airex step up ant/ post and lateral  10 x ea LE and ea direction  Neuro re-ed   SLS progression - airex and 4 inch step  3 x 30 sec ea   Neuro re-ed                                 Treatment provided this session     Pt educated throughout session about proper posture and technique with exercises. Improved exercise technique, movement at target joints, use of target muscles after min to mod verbal, visual, tactile cues. Note: Portions of this document were prepared using Dragon voice recognition software and although reviewed may contain unintentional dictation errors in syntax, grammar, or spelling.     PATIENT EDUCATION: Education details: Pt educated throughout session about proper  posture and technique with exercises. Improved exercise technique, movement at target joints, use of target muscles after min to mod verbal, visual, tactile cues.  Person educated: Patient Education method: Explanation Education comprehension: verbalized understanding  HOME EXERCISE PROGRAM: Access Code: [email or web address] URL: [email or web address] Date: 10/29/2022 Prepared by: Maureen Ralphs  Exercises - Single Leg Stance  - 3 x weekly - 5-10 sets - as long as possible-  hold - Tandem Stance  - 3 x weekly - 3 sets - as long as possible hold  GOALS: Goals reviewed with patient? Yes  SHORT TERM GOALS: Target date: 12/10/2022  Patient will be independent with HEP in order to improve strength and balance in order to decrease fall risk and improve function at home and work.   Baseline: EVAL- No formal HEP in place Goal status: INITIAL  LONG TERM GOALS: Target  date: 01/21/2023  1.  Patient (> 38 years old) will complete five times sit to stand test in < 15 seconds indicating an increased LE strength and improved balance. Baseline: EVAL=58 Goal status: INITIAL  2.  Patient will increase FOTO score to equal to or greater than  59   to demonstrate statistically significant improvement in mobility and quality of life.  Baseline: EVAL= 58 Goal status: INITIAL   3.  Patient will increase Berg Balance score by > 3 points to demonstrate decreased fall risk during functional activities. Baseline: EVAL=50/56 Goal status: INITIAL  4.   Patient will demonstrate improved dynamic standing balance as seen by single leg standing > 10 sec each LE  Baseline: EVAL=2-3 sec each LE Goal status: INITIAL  ASSESSMENT:  CLINICAL IMPRESSION: Patient presents with good motivation for completion of physical therapy activities.  Patient performs dynamic gait index and has mild impairments in balance as a result of this.  In future patient still reports balance difficulties may recommend many best balance test due to patient's high level of baseline function.  Patient provided with new home exercise in order to complete on foam as well as as dynamic marching added to HEP but was unable to be found in Med[employer].  Patient responds well to all interventions showing improvement in single-leg stability on the left side with practice.  Patient instructed he can complete single-leg balance practice whenever he is at a counter including when he is brushing his teeth or washing his hands in order to provide some further reminders to complete HEP more regularly.Pt will continue to benefit from skilled physical therapy intervention to address impairments, improve QOL, and attain therapy goals.     OBJECTIVE IMPAIRMENTS: decreased balance, decreased coordination, difficulty walking, decreased strength, and postural dysfunction.   ACTIVITY LIMITATIONS: carrying, lifting, bending,  standing, and squatting  PARTICIPATION LIMITATIONS: community activity and yard work  PERSONAL FACTORS: 1-2 comorbidities: HTN; DM  are also affecting patient's functional outcome.   REHAB POTENTIAL: Good  CLINICAL DECISION MAKING: Stable/uncomplicated  EVALUATION COMPLEXITY: Low  PLAN:  PT FREQUENCY: 1-2x/week  PT DURATION: 12 weeks  PLANNED INTERVENTIONS: Therapeutic exercises, Therapeutic activity, Neuromuscular re-education, Balance training, Gait training, Patient/Family education, Self Care, Joint mobilization, Joint manipulation, Stair training, Vestibular training, Canalith repositioning, DME instructions, Dry Needling, Electrical stimulation, Spinal manipulation, Spinal mobilization, Cryotherapy, Moist heat, Taping, Manual therapy, and Re-evaluation  PLAN FOR NEXT SESSION: Continue with balance training; Add handout for SLS/Tandem standing; Assess more dynamic balance- score DGI or FGA and add goal as appropriate. Progress HEP    Norman Herrlich, PT 11/08/2022, 7:46 AM

## 2022-11-05 ENCOUNTER — Ambulatory Visit: Payer: Medicare Other | Admitting: Physical Therapy

## 2022-11-07 ENCOUNTER — Ambulatory Visit: Payer: Medicare Other | Admitting: Physical Therapy

## 2022-11-08 ENCOUNTER — Ambulatory Visit: Payer: Medicare Other | Admitting: Physical Therapy

## 2022-11-08 DIAGNOSIS — R269 Unspecified abnormalities of gait and mobility: Secondary | ICD-10-CM | POA: Diagnosis not present

## 2022-11-08 DIAGNOSIS — R2681 Unsteadiness on feet: Secondary | ICD-10-CM

## 2022-11-08 DIAGNOSIS — R278 Other lack of coordination: Secondary | ICD-10-CM

## 2022-11-08 DIAGNOSIS — M6281 Muscle weakness (generalized): Secondary | ICD-10-CM

## 2022-11-08 DIAGNOSIS — R262 Difficulty in walking, not elsewhere classified: Secondary | ICD-10-CM

## 2022-11-08 NOTE — Therapy (Signed)
OUTPATIENT PHYSICAL THERAPY NEURO TREATMENT   Patient Name: Calvin Roth MRN: [id number] DOB:24-Mar-1946, 77 y.o., male Today's Date: 11/08/2022   PCP: Dr. Einar Crow REFERRING PROVIDER: Dr. Einar Crow  END OF SESSION:  PT End of Session - 11/08/22 0858     Visit Number 3    Number of Visits 24    Date for PT Re-Evaluation 01/21/23    Progress Note Due on Visit 10    PT Start Time 0859    PT Stop Time 0930    PT Time Calculation (min) 31 min    Equipment Utilized During Treatment Gait belt    Activity Tolerance Patient tolerated treatment well    Behavior During Therapy [facility] for tasks assessed/performed              Past Medical History:  Diagnosis Date   Actinic keratosis    Aortic atherosclerosis (HCC)    Aortic root dilatation (HCC) 03/12/2019   a.) CTA 03/22/2019 --> 4.5 cm   Ascending aortic aneurysm (HCC) 10/31/2017   a.) fusiform; measured 4.4 cm   BPH (benign prostatic hyperplasia)    CKD (chronic kidney disease), stage III (HCC)    Coronary artery disease    DDD (degenerative disc disease), lumbar    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 Moderate to marked atypia, edge involved. Excised 09/12/2006, margins free.   Dysplastic nevus 11/19/2007   Left costal margin/infrapectoral. Slight to moderate atypia, margin focally involved.   Dysplastic nevus 11/19/2007   Right lateral flank near waistline. Moderate atypia, extends to one edge.    Dysplastic nevus 05/25/2019   Left lateral tricep. Moderate atypia, deep margin involved.    GERD (gastroesophageal reflux disease)    Hepatic steatosis    Hyperlipidemia    Hypertension    Intermittent alternating exotropia    Neurogenic bladder    a.) self catheriterizes FOUR times daily   Osteoarthritis    Serrated adenoma of colon 07/17/2016   Sliding hiatal hernia    T2DM (type 2 diabetes mellitus) (HCC)    Past Surgical History:   Procedure Laterality Date   CATARACT EXTRACTION Left 05/15/2018   CATARACT EXTRACTION Right 04/03/2018   CHOLECYSTECTOMY     COLONOSCOPY WITH PROPOFOL N/A 07/17/2016   Procedure: COLONOSCOPY WITH PROPOFOL;  Surgeon: Christena Deem, MD;  Location: [facility] ENDOSCOPY;  Service: Endoscopy;  Laterality: N/A;   COLONOSCOPY WITH PROPOFOL N/A 02/04/2018   Procedure: COLONOSCOPY WITH PROPOFOL;  Surgeon: Christena Deem, MD;  Location: [facility] ENDOSCOPY;  Service: Endoscopy;  Laterality: N/A;   EYE SURGERY Left 2000   Strabismus correction   FOREIGN BODY REMOVAL Left 10/23/2019   Procedure: REMOVAL FOREIGN BODY LEFT HEEL;  Surgeon: Gwyneth Revels, DPM;  Location: [facility] ORS;  Service: Podiatry;  Laterality: Left;   GASTRIC RESTRICTION SURGERY N/A 1982   "stomach stapled for weight loss"   LUMBAR FUSION N/A 02/10/2020   Procedure: PLIF,IP,PSI L2- L3;EXPL FUSION; Location: [facility]; Surgeon: Tressie Stalker, MD   POSTERIOR LUMBAR FUSION N/A 05/04/2015   Procedure: LUMBAR THREE-FOUR, LUMBAR FOUR-FIVE POSTERIOR LUMBAR FUSION; Location: [facility]; Surgeon: Tressie Stalker, MD   TONSILLECTOMY Bilateral 1954   TRANSANAL EXCISION OF RECTAL MASS N/A 09/18/2016   Procedure: TRANSANAL EXCISION OF RECTAL TUMOR;  Surgeon: Kieth Brightly, MD;  Location: [facility] ORS;  Service: General;  Laterality: N/A;   ULNAR COLLATERAL LIGAMENT REPAIR Left 12/28/2020   Procedure: LEFT THUMB  ULNAR COLLATERAL LIGAMENT REPAIR/RECONSTRUCTION;  Surgeon: Christena Flake, MD;  Location: [facility] ORS;  Service: Orthopedics;  Laterality: Left;   Patient Active Problem List   Diagnosis Date Noted   Spondylolisthesis of lumbar region 05/04/2015   Degeneration of intervertebral disc of lumbar region 10/19/2014   Neuritis or radiculitis due to rupture of lumbar intervertebral disc 10/19/2014   Lumbar canal stenosis 10/19/2014   Morbid obesity (HCC) 01/23/2014   Benign prostatic hyperplasia with urinary obstruction  10/01/2013   Type 2 diabetes mellitus (HCC) 10/01/2013   Benign hypertension 10/01/2013   Spinal stenosis 10/01/2013    ONSET DATE: around July 2023  REFERRING DIAG: R27.0 (ICD-10-CM) - Ataxia, unspecified   THERAPY DIAG:  No diagnosis found.  Rationale for Evaluation and Treatment: Rehabilitation  SUBJECTIVE:                                                                                                                                                                                             SUBJECTIVE STATEMENT: Mild LPB on this day, but states that is normal. Will be out of town for then next week. No other updates on this day.    Pt accompanied by: self  PERTINENT HISTORY: Past Medical History:  Diagnosis Date  Diabetes mellitus type 2, uncomplicated (CMS/HHS-HCC)  Hyperlipidemia  Hypertension  Serrated adenoma of colon 07/17/2016   Past Surgical History:  Procedure Laterality Date  COLONOSCOPY 07/17/2016  Serrated adenoma/Repeat 65yr/MUS  COLONOSCOPY 02/04/2018  Tubular adenoma of the colon/Hyperplastic colon polyp/Repeat 72yrs/MUS  CATARACT EXTRACTION W/ INTRAOCULAR LENS IMPLANT Right 04/2018  LENS EYE SURGERY Right 04/03/2018  standard monofocal  CATARACT EXTRACTION Left 05/2018  LENS EYE SURGERY Left 05/15/2018  back surgery 02/10/2020  fused L2 and L3  Primary repair of ulnar collateral ligament with internal brace augmentation, left thumb Left 12/28/2020  Dr.Poggi  COLONOSCOPY 01/25/2022  PHxCP/normal colon/no repeat/CTL  CHOLECYSTECTOMY  COLONOSCOPY 04/15/2004, 07/13/2009  Correction of strabismus, left eye  EXPLORATION OF SPINAL FUSION  spinal fusion 05/2015  Gastric stapling  TONSILLECTOMY    PAIN:  Are you having pain? No  PRECAUTIONS: Fall  RED FLAGS: None   WEIGHT BEARING RESTRICTIONS: No  FALLS: Has patient fallen in last 6 months? No  LIVING ENVIRONMENT: Lives with: lives with their spouse Lives in: House/apartment Stairs: Yes:  Internal: 10-14 steps; on right going up and External: 5 steps; on left going up Has following equipment at home: None  PLOF: Independent  PATIENT GOALS: I would like to be more stable and stand on 1 foot and have better balance. I want to learn ways to regain balance  OBJECTIVE:  DIAGNOSTIC FINDINGS: none recent  COGNITION: Overall cognitive status: Within functional limits for tasks assessed   SENSATION: WFL- in all extremities  COORDINATION: Intact with heel to shin BLE  EDEMA:  None observed  LOWER EXTREMITY MMT:    MMT Right Eval Left Eval  Hip flexion 4 4  Hip extension 4 4  Hip abduction 4 4  Hip adduction 4 4  Hip internal rotation 4 4  Hip external rotation 4 4  Knee flexion 4 4  Knee extension 4 4  Ankle dorsiflexion 4 4  Ankle plantarflexion    Ankle inversion 4 4  Ankle eversion 4 4  (Blank rows = not tested)   TRANSFERS: Assistive device utilized: None  Sit to stand: Complete Independence Stand to sit: Complete Independence Chair to chair: Complete Independence Floor:  not tested   GAIT: Gait pattern: decreased arm swing- Right, decreased arm swing- Left, decreased step length- Right, and decreased step length- Left Distance walked: 100+ Assistive device utilized: None Level of assistance: Complete Independence  FUNCTIONAL TESTS:  5 times sit to stand: 16.01 sec without UE support Timed up and go (TUG): 10.91 sec without UE support 10 meter walk test: 1.0 m/s Berg Balance Scale: 50/56  PATIENT SURVEYS:  FOTO 58 with goal of 66  TODAY'S TREATMENT:                                                                                                                              DATE: 11/08/22  Treatment provided this session  NMR:  Standing on airex pad eyes open 30 sec/eyes Closed 10 sec x 4 bout  Statis semitandem on airex pads 2 x 30 sec bil  Foot tap from airex pad to airex pad on 4inch step x 10 bil  Step over/back 6inch hurdle 2x  10 bil    TE  Seated hip abduction GTB x 12bil  LE press down with GTB x 12 bil  Isometric hip adduction x 12 bil  Sit<>stand with GTB at knees x 10 no UE support   Throughout session, PT provided gait  belt  CGA with min tactile cues for awareness of lateral and posterior LOB. Noted to have poor use of ankle strategy to correct posterior LOB, requiring stepping strategy to prevent fall     PATIENT EDUCATION: Education details: Pt educated throughout session about proper posture and technique with exercises. Improved exercise technique, movement at target joints, use of target muscles after min to mod verbal, visual, tactile cues.  Person educated: Patient Education method: Explanation Education comprehension: verbalized understanding  HOME EXERCISE PROGRAM: Access Code: [email or web address] URL: [email or web address] Date: 10/29/2022 Prepared by: Maureen Ralphs  Exercises - Single Leg Stance  - 3 x weekly - 5-10 sets - as long as possible-  hold - Tandem Stance  - 3 x weekly - 3 sets - as long as possible hold  GOALS: Goals reviewed with patient? Yes  SHORT TERM GOALS: Target date:  12/10/2022  Patient will be independent with HEP in order to improve strength and balance in order to decrease fall risk and improve function at home and work.   Baseline: EVAL- No formal HEP in place Goal status: INITIAL  LONG TERM GOALS: Target date: 01/21/2023  1.  Patient (> 2 years old) will complete five times sit to stand test in < 15 seconds indicating an increased LE strength and improved balance. Baseline: EVAL=58 Goal status: INITIAL  2.  Patient will increase FOTO score to equal to or greater than  59   to demonstrate statistically significant improvement in mobility and quality of life.  Baseline: EVAL= 58 Goal status: INITIAL   3.  Patient will increase Berg Balance score by > 3 points to demonstrate decreased fall risk during functional activities. Baseline:  EVAL=50/56 Goal status: INITIAL  4.   Patient will demonstrate improved dynamic standing balance as seen by single leg standing > 10 sec each LE  Baseline: EVAL=2-3 sec each LE Goal status: INITIAL  ASSESSMENT:  CLINICAL IMPRESSION: Patient presents with good motivation for completion of physical therapy activities, but arrived late to treatment session. PT treatment focused on dynamic balance and strengthening of BLE. Noted to have delayed ankle response to LOB on airex pad and reciprocal movement patterns. Also noted to have mild trendelenburg with fatigue with stepping tasks R>L. Encouraged to perform HEP while on vacation. Pt will continue to benefit from skilled physical therapy intervention to address impairments, improve QOL, and attain therapy goals.     OBJECTIVE IMPAIRMENTS: decreased balance, decreased coordination, difficulty walking, decreased strength, and postural dysfunction.   ACTIVITY LIMITATIONS: carrying, lifting, bending, standing, and squatting  PARTICIPATION LIMITATIONS: community activity and yard work  PERSONAL FACTORS: 1-2 comorbidities: HTN; DM  are also affecting patient's functional outcome.   REHAB POTENTIAL: Good  CLINICAL DECISION MAKING: Stable/uncomplicated  EVALUATION COMPLEXITY: Low  PLAN:  PT FREQUENCY: 1-2x/week  PT DURATION: 12 weeks  PLANNED INTERVENTIONS: Therapeutic exercises, Therapeutic activity, Neuromuscular re-education, Balance training, Gait training, Patient/Family education, Self Care, Joint mobilization, Joint manipulation, Stair training, Vestibular training, Canalith repositioning, DME instructions, Dry Needling, Electrical stimulation, Spinal manipulation, Spinal mobilization, Cryotherapy, Moist heat, Taping, Manual therapy, and Re-evaluation  PLAN FOR NEXT SESSION:   Dynamic and balance. Core stabilization/strengthening.  Progress HEP    Grier Rocher PT, DPT  Physical Therapist - [facility]  [facility]  9:46 AM 11/08/22

## 2022-11-12 ENCOUNTER — Ambulatory Visit: Payer: Medicare Other | Admitting: Physical Therapy

## 2022-11-14 ENCOUNTER — Ambulatory Visit: Payer: Medicare Other | Admitting: Physical Therapy

## 2022-11-19 ENCOUNTER — Ambulatory Visit: Payer: Medicare Other | Admitting: Physical Therapy

## 2022-11-19 DIAGNOSIS — M6281 Muscle weakness (generalized): Secondary | ICD-10-CM

## 2022-11-19 DIAGNOSIS — R269 Unspecified abnormalities of gait and mobility: Secondary | ICD-10-CM | POA: Diagnosis not present

## 2022-11-19 DIAGNOSIS — R2681 Unsteadiness on feet: Secondary | ICD-10-CM

## 2022-11-19 DIAGNOSIS — R262 Difficulty in walking, not elsewhere classified: Secondary | ICD-10-CM

## 2022-11-19 NOTE — Therapy (Signed)
OUTPATIENT PHYSICAL THERAPY NEURO TREATMENT   Patient Name: Calvin ABEGGLEN MRN: [id number] DOB:18-Jun-1945, 77 y.o., male Today's Date: 11/19/2022   PCP: Dr. Einar Crow REFERRING PROVIDER: Dr. Einar Crow  END OF SESSION:  PT End of Session - 11/19/22 0810     Visit Number 4    Number of Visits 24    Date for PT Re-Evaluation 01/21/23    Progress Note Due on Visit 10    PT Start Time 0808    PT Stop Time 0846    PT Time Calculation (min) 38 min    Equipment Utilized During Treatment Gait belt    Activity Tolerance Patient tolerated treatment well    Behavior During Therapy [facility] for tasks assessed/performed               Past Medical History:  Diagnosis Date   Actinic keratosis    Aortic atherosclerosis (HCC)    Aortic root dilatation (HCC) 03/12/2019   a.) CTA 03/22/2019 --> 4.5 cm   Ascending aortic aneurysm (HCC) 10/31/2017   a.) fusiform; measured 4.4 cm   BPH (benign prostatic hyperplasia)    CKD (chronic kidney disease), stage III (HCC)    Coronary artery disease    DDD (degenerative disc disease), lumbar    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 Moderate to marked atypia, edge involved. Excised 09/12/2006, margins free.   Dysplastic nevus 11/19/2007   Left costal margin/infrapectoral. Slight to moderate atypia, margin focally involved.   Dysplastic nevus 11/19/2007   Right lateral flank near waistline. Moderate atypia, extends to one edge.    Dysplastic nevus 05/25/2019   Left lateral tricep. Moderate atypia, deep margin involved.    GERD (gastroesophageal reflux disease)    Hepatic steatosis    Hyperlipidemia    Hypertension    Intermittent alternating exotropia    Neurogenic bladder    a.) self catheriterizes FOUR times daily   Osteoarthritis    Serrated adenoma of colon 07/17/2016   Sliding hiatal hernia    T2DM (type 2 diabetes mellitus) (HCC)    Past Surgical  History:  Procedure Laterality Date   CATARACT EXTRACTION Left 05/15/2018   CATARACT EXTRACTION Right 04/03/2018   CHOLECYSTECTOMY     COLONOSCOPY WITH PROPOFOL N/A 07/17/2016   Procedure: COLONOSCOPY WITH PROPOFOL;  Surgeon: Christena Deem, MD;  Location: [facility] ENDOSCOPY;  Service: Endoscopy;  Laterality: N/A;   COLONOSCOPY WITH PROPOFOL N/A 02/04/2018   Procedure: COLONOSCOPY WITH PROPOFOL;  Surgeon: Christena Deem, MD;  Location: [facility] ENDOSCOPY;  Service: Endoscopy;  Laterality: N/A;   EYE SURGERY Left 2000   Strabismus correction   FOREIGN BODY REMOVAL Left 10/23/2019   Procedure: REMOVAL FOREIGN BODY LEFT HEEL;  Surgeon: Gwyneth Revels, DPM;  Location: [facility] ORS;  Service: Podiatry;  Laterality: Left;   GASTRIC RESTRICTION SURGERY N/A 1982   "stomach stapled for weight loss"   LUMBAR FUSION N/A 02/10/2020   Procedure: PLIF,IP,PSI L2- L3;EXPL FUSION; Location: [facility]; Surgeon: Tressie Stalker, MD   POSTERIOR LUMBAR FUSION N/A 05/04/2015   Procedure: LUMBAR THREE-FOUR, LUMBAR FOUR-FIVE POSTERIOR LUMBAR FUSION; Location: [facility]; Surgeon: Tressie Stalker, MD   TONSILLECTOMY Bilateral 1954   TRANSANAL EXCISION OF RECTAL MASS N/A 09/18/2016   Procedure: TRANSANAL EXCISION OF RECTAL TUMOR;  Surgeon: Kieth Brightly, MD;  Location: [facility] ORS;  Service: General;  Laterality: N/A;   ULNAR COLLATERAL LIGAMENT REPAIR Left 12/28/2020   Procedure: LEFT  THUMB ULNAR COLLATERAL LIGAMENT REPAIR/RECONSTRUCTION;  Surgeon: Christena Flake, MD;  Location: [facility] ORS;  Service: Orthopedics;  Laterality: Left;   Patient Active Problem List   Diagnosis Date Noted   Spondylolisthesis of lumbar region 05/04/2015   Degeneration of intervertebral disc of lumbar region 10/19/2014   Neuritis or radiculitis due to rupture of lumbar intervertebral disc 10/19/2014   Lumbar canal stenosis 10/19/2014   Morbid obesity (HCC) 01/23/2014   Benign prostatic hyperplasia with urinary  obstruction 10/01/2013   Type 2 diabetes mellitus (HCC) 10/01/2013   Benign hypertension 10/01/2013   Spinal stenosis 10/01/2013    ONSET DATE: around July 2023  REFERRING DIAG: R27.0 (ICD-10-CM) - Ataxia, unspecified   THERAPY DIAG:  Unsteadiness on feet  Abnormality of gait and mobility  Difficulty in walking, not elsewhere classified  Muscle weakness (generalized)  Rationale for Evaluation and Treatment: Rehabilitation  SUBJECTIVE:                                                                                                                                                                                             SUBJECTIVE STATEMENT:  Pt reports he is doing well with no significant changes since last visit.   Pt accompanied by: self  PERTINENT HISTORY: Past Medical History:  Diagnosis Date  Diabetes mellitus type 2, uncomplicated (CMS/HHS-HCC)  Hyperlipidemia  Hypertension  Serrated adenoma of colon 07/17/2016   Past Surgical History:  Procedure Laterality Date  COLONOSCOPY 07/17/2016  Serrated adenoma/Repeat 45yr/MUS  COLONOSCOPY 02/04/2018  Tubular adenoma of the colon/Hyperplastic colon polyp/Repeat 66yrs/MUS  CATARACT EXTRACTION W/ INTRAOCULAR LENS IMPLANT Right 04/2018  LENS EYE SURGERY Right 04/03/2018  standard monofocal  CATARACT EXTRACTION Left 05/2018  LENS EYE SURGERY Left 05/15/2018  back surgery 02/10/2020  fused L2 and L3  Primary repair of ulnar collateral ligament with internal brace augmentation, left thumb Left 12/28/2020  Dr.Poggi  COLONOSCOPY 01/25/2022  PHxCP/normal colon/no repeat/CTL  CHOLECYSTECTOMY  COLONOSCOPY 04/15/2004, 07/13/2009  Correction of strabismus, left eye  EXPLORATION OF SPINAL FUSION  spinal fusion 05/2015  Gastric stapling  TONSILLECTOMY    PAIN:  Are you having pain? No  PRECAUTIONS: Fall  RED FLAGS: None   WEIGHT BEARING RESTRICTIONS: No  FALLS: Has patient fallen in last 6 months? No  LIVING  ENVIRONMENT: Lives with: lives with their spouse Lives in: House/apartment Stairs: Yes: Internal: 10-14 steps; on right going up and External: 5 steps; on left going up Has following equipment at home: None  PLOF: Independent  PATIENT GOALS: I would like to be more stable and stand on 1 foot and have better balance. I want to learn ways  to regain balance  OBJECTIVE:   DIAGNOSTIC FINDINGS: none recent  COGNITION: Overall cognitive status: Within functional limits for tasks assessed   SENSATION: WFL- in all extremities  COORDINATION: Intact with heel to shin BLE  EDEMA:  None observed  LOWER EXTREMITY MMT:    MMT Right Eval Left Eval  Hip flexion 4 4  Hip extension 4 4  Hip abduction 4 4  Hip adduction 4 4  Hip internal rotation 4 4  Hip external rotation 4 4  Knee flexion 4 4  Knee extension 4 4  Ankle dorsiflexion 4 4  Ankle plantarflexion    Ankle inversion 4 4  Ankle eversion 4 4  (Blank rows = not tested)   TRANSFERS: Assistive device utilized: None  Sit to stand: Complete Independence Stand to sit: Complete Independence Chair to chair: Complete Independence Floor:  not tested   GAIT: Gait pattern: decreased arm swing- Right, decreased arm swing- Left, decreased step length- Right, and decreased step length- Left Distance walked: 100+ Assistive device utilized: None Level of assistance: Complete Independence  FUNCTIONAL TESTS:  5 times sit to stand: 16.01 sec without UE support Timed up and go (TUG): 10.91 sec without UE support 10 meter walk test: 1.0 m/s Berg Balance Scale: 50/56  PATIENT SURVEYS:  FOTO 58 with goal of 39  TODAY'S TREATMENT:                                                                                                                              DATE: 11/19/22  Treatment provided this session  Kore balance trainer working on patients proprioception and standing balance on dynamic surface  Setting type(s): penguin tux  racer ( frontal and sagital plane balance  Reps: 2 Comments: 1 post Lob corrected with min A and UE assist from patient     NMR:   Actuary working on patients proprioception and standing balance on dynamic surface  Setting type(s): penguin tux racer ( frontal and sagital plane balance  Reps: 2 Comments: 1 post Lob corrected with min A and UE assist from patient    LLE on airex R LE on step x 10 ball handoffs laterally ea side  Adducted stance on airex pad with ball toss x 20 reps Lateral stepping on airex beam with ball toss x 20 reps Foot tap from airex pad to airex pad on 6 inch step x 10 bil then another 10 laterally to ea side    TE  Sit<>stand x 10 no UE   LAQ 4# AW 2 x 12 reps  Seated hip abduction  step over  with 4# AW x 10 ea  Sit<>stand x 10 no UE    Throughout session, PT provided gait  belt  CGA with min tactile cues for awareness of lateral and posterior LOB. Noted to have poor use of ankle strategy to correct posterior LOB, requiring stepping strategy to prevent fall     PATIENT  EDUCATION: Education details: Pt educated throughout session about proper posture and technique with exercises. Improved exercise technique, movement at target joints, use of target muscles after min to mod verbal, visual, tactile cues.  Person educated: Patient Education method: Explanation Education comprehension: verbalized understanding  HOME EXERCISE PROGRAM: Access Code: [email or web address] URL: [email or web address] Date: 10/29/2022 Prepared by: Maureen Ralphs  Exercises - Single Leg Stance  - 3 x weekly - 5-10 sets - as long as possible-  hold - Tandem Stance  - 3 x weekly - 3 sets - as long as possible hold  GOALS: Goals reviewed with patient? Yes  SHORT TERM GOALS: Target date: 12/10/2022  Patient will be independent with HEP in order to improve strength and balance in order to decrease fall risk and improve function at home and work.   Baseline:  EVAL- No formal HEP in place Goal status: INITIAL  LONG TERM GOALS: Target date: 01/21/2023  1.  Patient (> 21 years old) will complete five times sit to stand test in < 15 seconds indicating an increased LE strength and improved balance. Baseline: EVAL=58 Goal status: INITIAL  2.  Patient will increase FOTO score to equal to or greater than  59   to demonstrate statistically significant improvement in mobility and quality of life.  Baseline: EVAL= 58 Goal status: INITIAL   3.  Patient will increase Berg Balance score by > 3 points to demonstrate decreased fall risk during functional activities. Baseline: EVAL=50/56 Goal status: INITIAL  4.   Patient will demonstrate improved dynamic standing balance as seen by single leg standing > 10 sec each LE  Baseline: EVAL=2-3 sec each LE Goal status: INITIAL  ASSESSMENT:  CLINICAL IMPRESSION: Patient presents with good motivation for completion of physical therapy activities, but arrived a few minutes late to treatment session. PT treatment focused on dynamic balance and strengthening of BLE. Pt challenged with kore balance machine this date and will likely benefit from progressive use of this. Pt encouraged to use his wii fit at home for continued progression. Pt will continue to benefit from skilled physical therapy intervention to address impairments, improve QOL, and attain therapy goals.     OBJECTIVE IMPAIRMENTS: decreased balance, decreased coordination, difficulty walking, decreased strength, and postural dysfunction.   ACTIVITY LIMITATIONS: carrying, lifting, bending, standing, and squatting  PARTICIPATION LIMITATIONS: community activity and yard work  PERSONAL FACTORS: 1-2 comorbidities: HTN; DM  are also affecting patient's functional outcome.   REHAB POTENTIAL: Good  CLINICAL DECISION MAKING: Stable/uncomplicated  EVALUATION COMPLEXITY: Low  PLAN:  PT FREQUENCY: 1-2x/week  PT DURATION: 12 weeks  PLANNED  INTERVENTIONS: Therapeutic exercises, Therapeutic activity, Neuromuscular re-education, Balance training, Gait training, Patient/Family education, Self Care, Joint mobilization, Joint manipulation, Stair training, Vestibular training, Canalith repositioning, DME instructions, Dry Needling, Electrical stimulation, Spinal manipulation, Spinal mobilization, Cryotherapy, Moist heat, Taping, Manual therapy, and Re-evaluation  PLAN FOR NEXT SESSION:   Dynamic and balance. Core stabilization/strengthening.  Progress HEP    Norman Herrlich PT ,DPT Physical Therapist- [place]  [facility]   8:55 AM 11/19/22

## 2022-11-22 ENCOUNTER — Ambulatory Visit: Payer: Medicare Other | Admitting: Physical Therapy

## 2022-11-22 DIAGNOSIS — M6281 Muscle weakness (generalized): Secondary | ICD-10-CM

## 2022-11-22 DIAGNOSIS — R269 Unspecified abnormalities of gait and mobility: Secondary | ICD-10-CM

## 2022-11-22 DIAGNOSIS — R262 Difficulty in walking, not elsewhere classified: Secondary | ICD-10-CM

## 2022-11-22 DIAGNOSIS — R278 Other lack of coordination: Secondary | ICD-10-CM

## 2022-11-22 DIAGNOSIS — R2681 Unsteadiness on feet: Secondary | ICD-10-CM

## 2022-11-22 NOTE — Therapy (Signed)
OUTPATIENT PHYSICAL THERAPY NEURO TREATMENT   Patient Name: Calvin MCELHINNY MRN: [id number] DOB:12-Jun-1945, 77 y.o., male Today's Date: 11/22/2022   PCP: Dr. Einar Crow REFERRING PROVIDER: Dr. Einar Crow  END OF SESSION:  PT End of Session - 11/22/22 0933     Visit Number 5    Number of Visits 24    Date for PT Re-Evaluation 01/21/23    Progress Note Due on Visit 10    PT Start Time 0935    PT Stop Time 1015    PT Time Calculation (min) 40 min    Equipment Utilized During Treatment Gait belt    Activity Tolerance Patient tolerated treatment well    Behavior During Therapy [facility] for tasks assessed/performed               Past Medical History:  Diagnosis Date   Actinic keratosis    Aortic atherosclerosis (HCC)    Aortic root dilatation (HCC) 03/12/2019   a.) CTA 03/22/2019 --> 4.5 cm   Ascending aortic aneurysm (HCC) 10/31/2017   a.) fusiform; measured 4.4 cm   BPH (benign prostatic hyperplasia)    CKD (chronic kidney disease), stage III (HCC)    Coronary artery disease    DDD (degenerative disc disease), lumbar    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 Moderate to marked atypia, edge involved. Excised 09/12/2006, margins free.   Dysplastic nevus 11/19/2007   Left costal margin/infrapectoral. Slight to moderate atypia, margin focally involved.   Dysplastic nevus 11/19/2007   Right lateral flank near waistline. Moderate atypia, extends to one edge.    Dysplastic nevus 05/25/2019   Left lateral tricep. Moderate atypia, deep margin involved.    GERD (gastroesophageal reflux disease)    Hepatic steatosis    Hyperlipidemia    Hypertension    Intermittent alternating exotropia    Neurogenic bladder    a.) self catheriterizes FOUR times daily   Osteoarthritis    Serrated adenoma of colon 07/17/2016   Sliding hiatal hernia    T2DM (type 2 diabetes mellitus) (HCC)    Past Surgical  History:  Procedure Laterality Date   CATARACT EXTRACTION Left 05/15/2018   CATARACT EXTRACTION Right 04/03/2018   CHOLECYSTECTOMY     COLONOSCOPY WITH PROPOFOL N/A 07/17/2016   Procedure: COLONOSCOPY WITH PROPOFOL;  Surgeon: Christena Deem, MD;  Location: [facility] ENDOSCOPY;  Service: Endoscopy;  Laterality: N/A;   COLONOSCOPY WITH PROPOFOL N/A 02/04/2018   Procedure: COLONOSCOPY WITH PROPOFOL;  Surgeon: Christena Deem, MD;  Location: [facility] ENDOSCOPY;  Service: Endoscopy;  Laterality: N/A;   EYE SURGERY Left 2000   Strabismus correction   FOREIGN BODY REMOVAL Left 10/23/2019   Procedure: REMOVAL FOREIGN BODY LEFT HEEL;  Surgeon: Gwyneth Revels, DPM;  Location: [facility] ORS;  Service: Podiatry;  Laterality: Left;   GASTRIC RESTRICTION SURGERY N/A 1982   "stomach stapled for weight loss"   LUMBAR FUSION N/A 02/10/2020   Procedure: PLIF,IP,PSI L2- L3;EXPL FUSION; Location: [facility]; Surgeon: Tressie Stalker, MD   POSTERIOR LUMBAR FUSION N/A 05/04/2015   Procedure: LUMBAR THREE-FOUR, LUMBAR FOUR-FIVE POSTERIOR LUMBAR FUSION; Location[facility]; Surgeon: Tressie Stalker, MD   TONSILLECTOMY Bilateral 1954   TRANSANAL EXCISION OF RECTAL MASS N/A 09/18/2016   Procedure: TRANSANAL EXCISION OF RECTAL TUMOR;  Surgeon: Kieth Brightly, MD;  Location: [facility] ORS;  Service: General;  Laterality: N/A;   ULNAR COLLATERAL LIGAMENT REPAIR Left 12/28/2020   Procedure: LEFT  THUMB ULNAR COLLATERAL LIGAMENT REPAIR/RECONSTRUCTION;  Surgeon: Christena Flake, MD;  Location: [facility] ORS;  Service: Orthopedics;  Laterality: Left;   Patient Active Problem List   Diagnosis Date Noted   Spondylolisthesis of lumbar region 05/04/2015   Degeneration of intervertebral disc of lumbar region 10/19/2014   Neuritis or radiculitis due to rupture of lumbar intervertebral disc 10/19/2014   Lumbar canal stenosis 10/19/2014   Morbid obesity (HCC) 01/23/2014   Benign prostatic hyperplasia with urinary  obstruction 10/01/2013   Type 2 diabetes mellitus (HCC) 10/01/2013   Benign hypertension 10/01/2013   Spinal stenosis 10/01/2013    ONSET DATE: around July 2023  REFERRING DIAG: R27.0 (ICD-10-CM) - Ataxia, unspecified   THERAPY DIAG:  Unsteadiness on feet  Abnormality of gait and mobility  Difficulty in walking, not elsewhere classified  Muscle weakness (generalized)  Other lack of coordination  Rationale for Evaluation and Treatment: Rehabilitation  SUBJECTIVE:                                                                                                                                                                                             SUBJECTIVE STATEMENT:  Pt reports he is doing well. States that he feeling like he was "weaving" more while walking into hospital clinic compared to last week.    Pt accompanied by: self  PERTINENT HISTORY: Past Medical History:  Diagnosis Date  Diabetes mellitus type 2, uncomplicated (CMS/HHS-HCC)  Hyperlipidemia  Hypertension  Serrated adenoma of colon 07/17/2016   Past Surgical History:  Procedure Laterality Date  COLONOSCOPY 07/17/2016  Serrated adenoma/Repeat 36yr/MUS  COLONOSCOPY 02/04/2018  Tubular adenoma of the colon/Hyperplastic colon polyp/Repeat 15yrs/MUS  CATARACT EXTRACTION W/ INTRAOCULAR LENS IMPLANT Right 04/2018  LENS EYE SURGERY Right 04/03/2018  standard monofocal  CATARACT EXTRACTION Left 05/2018  LENS EYE SURGERY Left 05/15/2018  back surgery 02/10/2020  fused L2 and L3  Primary repair of ulnar collateral ligament with internal brace augmentation, left thumb Left 12/28/2020  Dr.Poggi  COLONOSCOPY 01/25/2022  PHxCP/normal colon/no repeat/CTL  CHOLECYSTECTOMY  COLONOSCOPY 04/15/2004, 07/13/2009  Correction of strabismus, left eye  EXPLORATION OF SPINAL FUSION  spinal fusion 05/2015  Gastric stapling  TONSILLECTOMY    PAIN:  Are you having pain? No  PRECAUTIONS: Fall  RED  FLAGS: None   WEIGHT BEARING RESTRICTIONS: No  FALLS: Has patient fallen in last 6 months? No  LIVING ENVIRONMENT: Lives with: lives with their spouse Lives in: House/apartment Stairs: Yes: Internal: 10-14 steps; on right going up and External: 5 steps; on left going up Has following equipment at home: None  PLOF: Independent  PATIENT GOALS: I would like to  be more stable and stand on 1 foot and have better balance. I want to learn ways to regain balance  OBJECTIVE:   DIAGNOSTIC FINDINGS: none recent  COGNITION: Overall cognitive status: Within functional limits for tasks assessed   SENSATION: WFL- in all extremities  COORDINATION: Intact with heel to shin BLE  EDEMA:  None observed  LOWER EXTREMITY MMT:    MMT Right Eval Left Eval  Hip flexion 4 4  Hip extension 4 4  Hip abduction 4 4  Hip adduction 4 4  Hip internal rotation 4 4  Hip external rotation 4 4  Knee flexion 4 4  Knee extension 4 4  Ankle dorsiflexion 4 4  Ankle plantarflexion    Ankle inversion 4 4  Ankle eversion 4 4  (Blank rows = not tested)   TRANSFERS: Assistive device utilized: None  Sit to stand: Complete Independence Stand to sit: Complete Independence Chair to chair: Complete Independence Floor:  not tested   GAIT: Gait pattern: decreased arm swing- Right, decreased arm swing- Left, decreased step length- Right, and decreased step length- Left Distance walked: 100+ Assistive device utilized: None Level of assistance: Complete Independence  FUNCTIONAL TESTS:  5 times sit to stand: 16.01 sec without UE support Timed up and go (TUG): 10.91 sec without UE support 10 meter walk test: 1.0 m/s Berg Balance Scale: 50/56  PATIENT SURVEYS:  FOTO 58 with goal of 43  TODAY'S TREATMENT:                                                                                                                              DATE: 11/22/22  Treatment provided this session VS assessment   Sitting:  128/73 HR 72  Standing: 0 min; 112/67 HR 74 2 min:  110/69 HR 71   Tandem stance 3 x 20 sec bil  Narrow stance eyes open 2 x 30 sec  Eyes closed normal BOS 2 x 30 sec  Eyes closed narrow BOS 3 x 15 sce  Narrow BOS head turns x 10 bil  Narrow BOS head nods x 10 each Performed with intermittent UE support and cues for posture and gaze stabilization   Foot tap on 4 inch step x 10 bil  Foot tap to target of spikeball on 4inch step 2 x 10 bil   Semitandem stance with lead LE on 4inch step 2 x 20 sec bil   TE  Hip abduction GTB x 12 with 2 sec hold  LAQ 2 sec hold GTB x 10 Reciprocal march with GTB x 12 with with 3 sec hold    Throughout session, PT provided gait  belt  CGA with min tactile cues for awareness of lateral and posterior LOB. Noted to have poor use of ankle strategy to correct posterior LOB, requiring stepping strategy to prevent fall     PATIENT EDUCATION: Education details: Pt educated throughout session about proper posture and technique with exercises. Improved exercise technique,  movement at target joints, use of target muscles after min to mod verbal, visual, tactile cues.  Person educated: Patient Education method: Explanation Education comprehension: verbalized understanding  HOME EXERCISE PROGRAM: Access Code: [email or web address] URL: [email or web address] Date: 11/22/2022 Prepared by: Grier Rocher  Exercises - Standing Forward Step Taps with Counter Support  - 1 x daily - 7 x weekly - 3 sets - 12 reps - Romberg Stance with Eyes Closed  - 1 x daily - 7 x weekly - 3 sets - 3 reps - 20 hold - Romberg Stance with Head Nods  - 1 x daily - 7 x weekly - 3 sets - 10 reps - Romberg Stance with Head Rotation  - 1 x daily - 7 x weekly - 3 sets - 10 reps - Seated Hip Abduction with Resistance  - 1 x daily - 7 x weekly - 3 sets - 10 reps - 3 hold - Sitting Knee Extension with Resistance  - 1 x daily - 7 x weekly - 3 sets - 10 reps - 3 hold - Seated  March with Resistance  - 1 x daily - 7 x weekly - 3 sets - 10 reps - 2 hold - Standing Tandem Balance with Counter Support  - 1 x daily - 7 x weekly - 4 sets - 4 reps - 20 hold  Access Code: [email or web address] URL: [email or web address] Date: 10/29/2022 Prepared by: Maureen Ralphs  Exercises - Single Leg Stance  - 3 x weekly - 5-10 sets - as long as possible-  hold - Tandem Stance  - 3 x weekly - 3 sets - as long as possible hold   GOALS: Goals reviewed with patient? Yes  SHORT TERM GOALS: Target date: 12/10/2022  Patient will be independent with HEP in order to improve strength and balance in order to decrease fall risk and improve function at home and work.   Baseline: EVAL- No formal HEP in place Goal status: INITIAL  LONG TERM GOALS: Target date: 01/21/2023  1.  Patient (> 59 years old) will complete five times sit to stand test in < 15 seconds indicating an increased LE strength and improved balance. Baseline: EVAL=58 Goal status: INITIAL  2.  Patient will increase FOTO score to equal to or greater than  59   to demonstrate statistically significant improvement in mobility and quality of life.  Baseline: EVAL= 58 Goal status: INITIAL   3.  Patient will increase Berg Balance score by > 3 points to demonstrate decreased fall risk during functional activities. Baseline: EVAL=50/56 Goal status: INITIAL  4.   Patient will demonstrate improved dynamic standing balance as seen by single leg standing > 10 sec each LE  Baseline: EVAL=2-3 sec each LE Goal status: INITIAL  ASSESSMENT:  CLINICAL IMPRESSION: Patient presents with good motivation for completion of physical therapy activities. PT treatment continued to address dynamic and staic balance deficits as well as asymmety in BLE stretch for hips and quads. HEP expanded with hand out provided as listed above.  Pt will continue to benefit from skilled physical therapy intervention to address impairments, improve QOL,  and attain therapy goals.     OBJECTIVE IMPAIRMENTS: decreased balance, decreased coordination, difficulty walking, decreased strength, and postural dysfunction.   ACTIVITY LIMITATIONS: carrying, lifting, bending, standing, and squatting  PARTICIPATION LIMITATIONS: community activity and yard work  PERSONAL FACTORS: 1-2 comorbidities: HTN; DM  are also affecting patient's functional outcome.   REHAB POTENTIAL: Good  CLINICAL DECISION MAKING: Stable/uncomplicated  EVALUATION COMPLEXITY: Low  PLAN:  PT FREQUENCY: 1-2x/week  PT DURATION: 12 weeks  PLANNED INTERVENTIONS: Therapeutic exercises, Therapeutic activity, Neuromuscular re-education, Balance training, Gait training, Patient/Family education, Self Care, Joint mobilization, Joint manipulation, Stair training, Vestibular training, Canalith repositioning, DME instructions, Dry Needling, Electrical stimulation, Spinal manipulation, Spinal mobilization, Cryotherapy, Moist heat, Taping, Manual therapy, and Re-evaluation  PLAN FOR NEXT SESSION:   Dynamic and balance. Core stabilization/strengthening.      Golden Pop PT ,DPT Physical Therapist- [place]  [facility]   10:36 AM 11/22/22

## 2022-11-26 ENCOUNTER — Ambulatory Visit: Payer: Medicare Other | Admitting: Physical Therapy

## 2022-11-26 DIAGNOSIS — R269 Unspecified abnormalities of gait and mobility: Secondary | ICD-10-CM | POA: Diagnosis not present

## 2022-11-26 DIAGNOSIS — R262 Difficulty in walking, not elsewhere classified: Secondary | ICD-10-CM

## 2022-11-26 DIAGNOSIS — R2681 Unsteadiness on feet: Secondary | ICD-10-CM

## 2022-11-26 DIAGNOSIS — M6281 Muscle weakness (generalized): Secondary | ICD-10-CM

## 2022-11-26 NOTE — Therapy (Addendum)
OUTPATIENT PHYSICAL THERAPY NEURO TREATMENT   Patient Name: Calvin Roth MRN: [id number] DOB:[birth year], 77 y.o., male Today's Date: 11/26/2022   PCP: Dr. Einar Crow REFERRING PROVIDER: Dr. Einar Crow  END OF SESSION:  PT End of Session - 11/26/22 0809     Visit Number 6    Number of Visits 24    Date for PT Re-Evaluation 01/21/23    Progress Note Due on Visit 10    PT Start Time 0805    PT Stop Time 0844    PT Time Calculation (min) 39 min    Equipment Utilized During Treatment Gait belt    Activity Tolerance Patient tolerated treatment well    Behavior During Therapy [facility] for tasks assessed/performed                Past Medical History:  Diagnosis Date   Actinic keratosis    Aortic atherosclerosis (HCC)    Aortic root dilatation (HCC) 03/12/2019   a.) CTA 03/22/2019 --> 4.5 cm   Ascending aortic aneurysm (HCC) 10/31/2017   a.) fusiform; measured 4.4 cm   BPH (benign prostatic hyperplasia)    CKD (chronic kidney disease), stage III (HCC)    Coronary artery disease    DDD (degenerative disc disease), lumbar    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 Moderate to marked atypia, edge involved. Excised 09/12/2006, margins free.   Dysplastic nevus 11/19/2007   Left costal margin/infrapectoral. Slight to moderate atypia, margin focally involved.   Dysplastic nevus 11/19/2007   Right lateral flank near waistline. Moderate atypia, extends to one edge.    Dysplastic nevus 05/25/2019   Left lateral tricep. Moderate atypia, deep margin involved.    GERD (gastroesophageal reflux disease)    Hepatic steatosis    Hyperlipidemia    Hypertension    Intermittent alternating exotropia    Neurogenic bladder    a.) self catheriterizes FOUR times daily   Osteoarthritis    Serrated adenoma of colon 07/17/2016   Sliding hiatal hernia    T2DM (type 2 diabetes mellitus) (HCC)    Past Surgical  History:  Procedure Laterality Date   CATARACT EXTRACTION Left 05/15/2018   CATARACT EXTRACTION Right 04/03/2018   CHOLECYSTECTOMY     COLONOSCOPY WITH PROPOFOL N/A 07/17/2016   Procedure: COLONOSCOPY WITH PROPOFOL;  Surgeon: Christena Deem, MD;  Location[facility] ENDOSCOPY;  Service: Endoscopy;  Laterality: N/A;   COLONOSCOPY WITH PROPOFOL N/A 02/04/2018   Procedure: COLONOSCOPY WITH PROPOFOL;  Surgeon: Christena Deem, MD;  Location: [facility] ENDOSCOPY;  Service: Endoscopy;  Laterality: N/A;   EYE SURGERY Left 2000   Strabismus correction   FOREIGN BODY REMOVAL Left 10/23/2019   Procedure: REMOVAL FOREIGN BODY LEFT HEEL;  Surgeon: Gwyneth Revels, DPM;  Location: [facility] ORS;  Service: Podiatry;  Laterality: Left;   GASTRIC RESTRICTION SURGERY N/A 1982   "stomach stapled for weight loss"   LUMBAR FUSION N/A 02/10/2020   Procedure: PLIF,IP,PSI L2- L3;EXPL FUSION; Location: [facility]; Surgeon: Tressie Stalker, MD   POSTERIOR LUMBAR FUSION N/A 05/04/2015   Procedure: LUMBAR THREE-FOUR, LUMBAR FOUR-FIVE POSTERIOR LUMBAR FUSION; Location: [facility]; Surgeon: Tressie Stalker, MD   TONSILLECTOMY Bilateral 1954   TRANSANAL EXCISION OF RECTAL MASS N/A 09/18/2016   Procedure: TRANSANAL EXCISION OF RECTAL TUMOR;  Surgeon: Kieth Brightly, MD;  Location: [facility] ORS;  Service: General;  Laterality: N/A;   ULNAR COLLATERAL LIGAMENT REPAIR Left 12/28/2020   Procedure:  LEFT THUMB ULNAR COLLATERAL LIGAMENT REPAIR/RECONSTRUCTION;  Surgeon: Christena Flake, MD;  Location: [facility] ORS;  Service: Orthopedics;  Laterality: Left;   Patient Active Problem List   Diagnosis Date Noted   Spondylolisthesis of lumbar region 05/04/2015   Degeneration of intervertebral disc of lumbar region 10/19/2014   Neuritis or radiculitis due to rupture of lumbar intervertebral disc 10/19/2014   Lumbar canal stenosis 10/19/2014   Morbid obesity (HCC) 01/23/2014   Benign prostatic hyperplasia with urinary  obstruction 10/01/2013   Type 2 diabetes mellitus (HCC) 10/01/2013   Benign hypertension 10/01/2013   Spinal stenosis 10/01/2013    ONSET DATE: around July 2023  REFERRING DIAG: R27.0 (ICD-10-CM) - Ataxia, unspecified   THERAPY DIAG:  No diagnosis found.  Rationale for Evaluation and Treatment: Rehabilitation  SUBJECTIVE:                                                                                                                                                                                             SUBJECTIVE STATEMENT:  Pt reports doing well today. Pt denies any recent falls/stumbles since prior session. Pt denies any updates to medications or medical appointment since prior session. Pt reports good compliance with HEP when time permits.    Pt accompanied by: self  PERTINENT HISTORY: Past Medical History:  Diagnosis Date  Diabetes mellitus type 2, uncomplicated (CMS/HHS-HCC)  Hyperlipidemia  Hypertension  Serrated adenoma of colon 07/17/2016   Past Surgical History:  Procedure Laterality Date  COLONOSCOPY 07/17/2016  Serrated adenoma/Repeat 50yr/MUS  COLONOSCOPY 02/04/2018  Tubular adenoma of the colon/Hyperplastic colon polyp/Repeat 64yrs/MUS  CATARACT EXTRACTION W/ INTRAOCULAR LENS IMPLANT Right 04/2018  LENS EYE SURGERY Right 04/03/2018  standard monofocal  CATARACT EXTRACTION Left 05/2018  LENS EYE SURGERY Left 05/15/2018  back surgery 02/10/2020  fused L2 and L3  Primary repair of ulnar collateral ligament with internal brace augmentation, left thumb Left 12/28/2020  Dr.Poggi  COLONOSCOPY 01/25/2022  PHxCP/normal colon/no repeat/CTL  CHOLECYSTECTOMY  COLONOSCOPY 04/15/2004, 07/13/2009  Correction of strabismus, left eye  EXPLORATION OF SPINAL FUSION  spinal fusion 05/2015  Gastric stapling  TONSILLECTOMY    PAIN:  Are you having pain? No  PRECAUTIONS: Fall  RED FLAGS: None   WEIGHT BEARING RESTRICTIONS: No  FALLS: Has patient fallen in  last 6 months? No  LIVING ENVIRONMENT: Lives with: lives with their spouse Lives in: House/apartment Stairs: Yes: Internal: 10-14 steps; on right going up and External: 5 steps; on left going up Has following equipment at home: None  PLOF: Independent  PATIENT GOALS: I would like to be more stable and stand on 1 foot and have better  balance. I want to learn ways to regain balance  OBJECTIVE:   DIAGNOSTIC FINDINGS: none recent  COGNITION: Overall cognitive status: Within functional limits for tasks assessed   SENSATION: WFL- in all extremities  COORDINATION: Intact with heel to shin BLE  EDEMA:  None observed  LOWER EXTREMITY MMT:    MMT Right Eval Left Eval  Hip flexion 4 4  Hip extension 4 4  Hip abduction 4 4  Hip adduction 4 4  Hip internal rotation 4 4  Hip external rotation 4 4  Knee flexion 4 4  Knee extension 4 4  Ankle dorsiflexion 4 4  Ankle plantarflexion    Ankle inversion 4 4  Ankle eversion 4 4  (Blank rows = not tested)   TRANSFERS: Assistive device utilized: None  Sit to stand: Complete Independence Stand to sit: Complete Independence Chair to chair: Complete Independence Floor:  not tested   GAIT: Gait pattern: decreased arm swing- Right, decreased arm swing- Left, decreased step length- Right, and decreased step length- Left Distance walked: 100+ Assistive device utilized: None Level of assistance: Complete Independence  FUNCTIONAL TESTS:  5 times sit to stand: 16.01 sec without UE support Timed up and go (TUG): 10.91 sec without UE support 10 meter walk test: 1.0 m/s Berg Balance Scale: 50/56  PATIENT SURVEYS:  FOTO 58 with goal of 31  TODAY'S TREATMENT:                                                                                                                              DATE: 11/26/22 TE   Reciprocal march - standing with with 5# AW 2 x 10 reps  Heel toe raise - seated, 5# AW donned, 20 degree incine to increase ROM  2 x 15 - seated toe raise on decline 2 x 15   Airex pad  NBOS with head turns 2 x 1 min ea of sup/inf and lateral head turns  Airex side step ups x 10 ea side   Kore balance trainer working on patients proprioception and standing balance on dynamic surface  Setting type(s): pop dot x 5 then circular maze x 1   Reps: 6 Comments: increased difficulty with ant and L lateral weight shift.    Stance on 25 degree inline x 45 sec   Throughout session, PT provided gait  belt  CGA with min tactile cues for awareness of lateral and posterior LOB.      PATIENT EDUCATION: Education details: Pt educated throughout session about proper posture and technique with exercises. Improved exercise technique, movement at target joints, use of target muscles after min to mod verbal, visual, tactile cues.  Person educated: Patient Education method: Explanation Education comprehension: verbalized understanding  HOME EXERCISE PROGRAM: Access Code: [email or web address] URL: [email or web address] Date: 11/22/2022 Prepared by: Grier Rocher  Exercises - Standing Forward Step Taps with Counter Support  - 1 x daily - 7 x weekly - 3 sets - 12 reps -  Romberg Stance with Eyes Closed  - 1 x daily - 7 x weekly - 3 sets - 3 reps - 20 hold - Romberg Stance with Head Nods  - 1 x daily - 7 x weekly - 3 sets - 10 reps - Romberg Stance with Head Rotation  - 1 x daily - 7 x weekly - 3 sets - 10 reps - Seated Hip Abduction with Resistance  - 1 x daily - 7 x weekly - 3 sets - 10 reps - 3 hold - Sitting Knee Extension with Resistance  - 1 x daily - 7 x weekly - 3 sets - 10 reps - 3 hold - Seated March with Resistance  - 1 x daily - 7 x weekly - 3 sets - 10 reps - 2 hold - Standing Tandem Balance with Counter Support  - 1 x daily - 7 x weekly - 4 sets - 4 reps - 20 hold  Access Code: [email or web address] URL: [email or web address] Date: 10/29/2022 Prepared by: Maureen Ralphs  Exercises - Single Leg Stance  -  3 x weekly - 5-10 sets - as long as possible-  hold - Tandem Stance  - 3 x weekly - 3 sets - as long as possible hold   GOALS: Goals reviewed with patient? Yes  SHORT TERM GOALS: Target date: 12/10/2022  Patient will be independent with HEP in order to improve strength and balance in order to decrease fall risk and improve function at home and work.   Baseline: EVAL- No formal HEP in place Goal status: INITIAL  LONG TERM GOALS: Target date: 01/21/2023  1.  Patient (> 6 years old) will complete five times sit to stand test in < 15 seconds indicating an increased LE strength and improved balance. Baseline: EVAL=58 Goal status: INITIAL  2.  Patient will increase FOTO score to equal to or greater than  59   to demonstrate statistically significant improvement in mobility and quality of life.  Baseline: EVAL= 58 Goal status: INITIAL   3.  Patient will increase Berg Balance score by > 3 points to demonstrate decreased fall risk during functional activities. Baseline: EVAL=50/56 Goal status: INITIAL  4.   Patient will demonstrate improved dynamic standing balance as seen by single leg standing > 10 sec each LE  Baseline: EVAL=2-3 sec each LE Goal status: INITIAL  ASSESSMENT:  CLINICAL IMPRESSION: Patient presents with good motivation for completion of physical therapy activities. PT treatment continued to address dynamic and staic balance deficits. Pt showing increased utilization of ankle strategies with balance with training and cues provided today.  Pt will continue to benefit from skilled physical therapy intervention to address impairments, improve QOL, and attain therapy goals.     OBJECTIVE IMPAIRMENTS: decreased balance, decreased coordination, difficulty walking, decreased strength, and postural dysfunction.   ACTIVITY LIMITATIONS: carrying, lifting, bending, standing, and squatting  PARTICIPATION LIMITATIONS: community activity and yard work  PERSONAL FACTORS: 1-2  comorbidities: HTN; DM  are also affecting patient's functional outcome.   REHAB POTENTIAL: Good  CLINICAL DECISION MAKING: Stable/uncomplicated  EVALUATION COMPLEXITY: Low  PLAN:  PT FREQUENCY: 1-2x/week  PT DURATION: 12 weeks  PLANNED INTERVENTIONS: Therapeutic exercises, Therapeutic activity, Neuromuscular re-education, Balance training, Gait training, Patient/Family education, Self Care, Joint mobilization, Joint manipulation, Stair training, Vestibular training, Canalith repositioning, DME instructions, Dry Needling, Electrical stimulation, Spinal manipulation, Spinal mobilization, Cryotherapy, Moist heat, Taping, Manual therapy, and Re-evaluation  PLAN FOR NEXT SESSION:   Dynamic and balance. Core stabilization/strengthening.      Ernst Bowler  Talbert Nan ,DPT Physical Therapist- [facility]   8:10 AM 11/26/22

## 2022-11-29 ENCOUNTER — Ambulatory Visit: Payer: Medicare Other | Admitting: Physical Therapy

## 2022-11-29 NOTE — Therapy (Deleted)
OUTPATIENT PHYSICAL THERAPY NEURO TREATMENT   Patient Name: Calvin Roth MRN: [id number] DOB:[birth year], 77 y.o., male Today's Date: 11/29/2022   PCP: Dr. Einar Crow REFERRING PROVIDER: Dr. 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 Moderate to marked atypia, edge involved. Excised 09/12/2006, margins free.   Dysplastic nevus 11/19/2007   Left costal margin/infrapectoral. Slight to moderate atypia, margin focally involved.   Dysplastic nevus 11/19/2007   Right lateral flank near waistline. Moderate atypia, extends to one edge.    Dysplastic nevus 05/25/2019   Left lateral tricep. Moderate atypia, deep margin involved.    GERD (gastroesophageal reflux disease)    Hepatic steatosis    Hyperlipidemia    Hypertension    Intermittent alternating exotropia    Neurogenic bladder    a.) self catheriterizes FOUR times daily   Osteoarthritis    Serrated adenoma of colon 07/17/2016   Sliding hiatal hernia    T2DM (type 2 diabetes mellitus) (HCC)    Past Surgical History:  Procedure Laterality Date   CATARACT EXTRACTION Left 05/15/2018   CATARACT EXTRACTION Right 04/03/2018   CHOLECYSTECTOMY     COLONOSCOPY WITH PROPOFOL N/A 07/17/2016   Procedure: COLONOSCOPY WITH PROPOFOL;  Surgeon: Christena Deem, MD;  Location: [facility];  Service: Endoscopy;  Laterality: N/A;   COLONOSCOPY WITH PROPOFOL N/A 02/04/2018   Procedure: COLONOSCOPY WITH PROPOFOL;  Surgeon:  Christena Deem, MD;  Location: [facility] ENDOSCOPY;  Service: Endoscopy;  Laterality: N/A;   EYE SURGERY Left 2000   Strabismus correction   FOREIGN BODY REMOVAL Left 10/23/2019   Procedure: REMOVAL FOREIGN BODY LEFT HEEL;  Surgeon: Gwyneth Revels, DPM;  Location: [facility];  Service: Podiatry;  Laterality: Left;   GASTRIC RESTRICTION SURGERY N/A 1982   "stomach stapled for weight loss"   LUMBAR FUSION N/A 02/10/2020   Procedure: PLIF,IP,PSI L2- L3;EXPL FUSION; Location: [facility]; Surgeon: Tressie Stalker, MD   POSTERIOR LUMBAR FUSION N/A 05/04/2015   Procedure: LUMBAR THREE-FOUR, LUMBAR FOUR-FIVE POSTERIOR LUMBAR FUSION; Location: [facility]; Surgeon: Tressie Stalker, MD   TONSILLECTOMY Bilateral 1954   TRANSANAL EXCISION OF RECTAL MASS N/A 09/18/2016   Procedure: TRANSANAL EXCISION OF RECTAL TUMOR;  Surgeon: Kieth Brightly, MD;  Location: [facility];  Service: General;  Laterality: N/A;   ULNAR COLLATERAL LIGAMENT REPAIR Left 12/28/2020   Procedure: LEFT THUMB ULNAR COLLATERAL LIGAMENT REPAIR/RECONSTRUCTION;  Surgeon: Christena Flake, MD;  Location: [facility];  Service: Orthopedics;  Laterality: Left;   Patient Active Problem List   Diagnosis Date Noted   Spondylolisthesis of lumbar region 05/04/2015   Degeneration of intervertebral disc of lumbar region 10/19/2014   Neuritis or radiculitis due to rupture of lumbar intervertebral disc 10/19/2014   Lumbar canal stenosis 10/19/2014   Morbid obesity (HCC) 01/23/2014   Benign prostatic hyperplasia with urinary obstruction 10/01/2013   Type 2 diabetes mellitus (HCC) 10/01/2013   Benign hypertension 10/01/2013  Spinal stenosis 10/01/2013    ONSET DATE: around July 2023  REFERRING DIAG: R27.0 (ICD-10-CM) - Ataxia, unspecified   THERAPY DIAG:  No diagnosis found.  Rationale for Evaluation and Treatment: Rehabilitation  SUBJECTIVE:                                                                                                                                                                                              SUBJECTIVE STATEMENT:  Pt reports doing well today. Pt denies any recent falls/stumbles since prior session. Pt denies any updates to medications or medical appointment since prior session. Pt reports good compliance with HEP when time permits.    Pt accompanied by: self  PERTINENT HISTORY: Past Medical History:  Diagnosis Date  Diabetes mellitus type 2, uncomplicated (CMS/HHS-HCC)  Hyperlipidemia  Hypertension  Serrated adenoma of colon 07/17/2016   Past Surgical History:  Procedure Laterality Date  COLONOSCOPY 07/17/2016  Serrated adenoma/Repeat 42yr/MUS  COLONOSCOPY 02/04/2018  Tubular adenoma of the colon/Hyperplastic colon polyp/Repeat 75yrs/MUS  CATARACT EXTRACTION W/ INTRAOCULAR LENS IMPLANT Right 04/2018  LENS EYE SURGERY Right 04/03/2018  standard monofocal  CATARACT EXTRACTION Left 05/2018  LENS EYE SURGERY Left 05/15/2018  back surgery 02/10/2020  fused L2 and L3  Primary repair of ulnar collateral ligament with internal brace augmentation, left thumb Left 12/28/2020  Dr.Poggi  COLONOSCOPY 01/25/2022  PHxCP/normal colon/no repeat/CTL  CHOLECYSTECTOMY  COLONOSCOPY 04/15/2004, 07/13/2009  Correction of strabismus, left eye  EXPLORATION OF SPINAL FUSION  spinal fusion 05/2015  Gastric stapling  TONSILLECTOMY    PAIN:  Are you having pain? No  PRECAUTIONS: Fall  RED FLAGS: None   WEIGHT BEARING RESTRICTIONS: No  FALLS: Has patient fallen in last 6 months? No  LIVING ENVIRONMENT: Lives with: lives with their spouse Lives in: House/apartment Stairs: Yes: Internal: 10-14 steps; on right going up and External: 5 steps; on left going up Has following equipment at home: None  PLOF: Independent  PATIENT GOALS: I would like to be more stable and stand on 1 foot and have better balance. I want to learn ways to regain balance  OBJECTIVE:   DIAGNOSTIC  FINDINGS: none recent  COGNITION: Overall cognitive status: Within functional limits for tasks assessed   SENSATION: WFL- in all extremities  COORDINATION: Intact with heel to shin BLE  EDEMA:  None observed  LOWER EXTREMITY MMT:    MMT Right Eval Left Eval  Hip flexion 4 4  Hip extension 4 4  Hip abduction 4 4  Hip adduction 4 4  Hip internal rotation 4 4  Hip external rotation 4 4  Knee flexion 4 4  Knee  extension 4 4  Ankle dorsiflexion 4 4  Ankle plantarflexion    Ankle inversion 4 4  Ankle eversion 4 4  (Blank rows = not tested)   TRANSFERS: Assistive device utilized: None  Sit to stand: Complete Independence Stand to sit: Complete Independence Chair to chair: Complete Independence Floor:  not tested   GAIT: Gait pattern: decreased arm swing- Right, decreased arm swing- Left, decreased step length- Right, and decreased step length- Left Distance walked: 100+ Assistive device utilized: None Level of assistance: Complete Independence  FUNCTIONAL TESTS:  5 times sit to stand: 16.01 sec without UE support Timed up and go (TUG): 10.91 sec without UE support 10 meter walk test: 1.0 m/s Berg Balance Scale: 50/56  PATIENT SURVEYS:  FOTO 58 with goal of 83  TODAY'S TREATMENT:                                                                                                                              DATE: 11/29/22 TE   Ladder drillls with 5# AW (for step length and picking up feet)   Hurdle and object step overs with 5# AW  Heel toe raise - seated, 5# AW donned, 20 degree incine to increase ROM 2 x 15 - seated toe raise on decline 2 x 15   Airex pad  NBOS with head turns 2 x 1 min ea of sup/inf and lateral head turns  Airex side step ups x 10 ea side   [place] balance trainer working on patients proprioception and standing balance on dynamic surface  Setting type(s): pop dot x 5 then circular maze x 1   Reps: 6 Comments: increased difficulty with ant  and L lateral weight shift.     Throughout session, PT provided gait  belt  CGA with min tactile cues for awareness of lateral and posterior LOB.      PATIENT EDUCATION: Education details: Pt educated throughout session about proper posture and technique with exercises. Improved exercise technique, movement at target joints, use of target muscles after min to mod verbal, visual, tactile cues.  Person educated: Patient Education method: Explanation Education comprehension: verbalized understanding  HOME EXERCISE PROGRAM: Access Code: [email or web address] URL: [email or web address] Date: 11/22/2022 Prepared by: Grier Rocher  Exercises - Standing Forward Step Taps with Counter Support  - 1 x daily - 7 x weekly - 3 sets - 12 reps - Romberg Stance with Eyes Closed  - 1 x daily - 7 x weekly - 3 sets - 3 reps - 20 hold - Romberg Stance with Head Nods  - 1 x daily - 7 x weekly - 3 sets - 10 reps - Romberg Stance with Head Rotation  - 1 x daily - 7 x weekly - 3 sets - 10 reps - Seated Hip Abduction with Resistance  - 1 x daily - 7 x weekly - 3 sets - 10 reps - 3 hold - Sitting Knee Extension with Resistance  - 1  x daily - 7 x weekly - 3 sets - 10 reps - 3 hold - Seated March with Resistance  - 1 x daily - 7 x weekly - 3 sets - 10 reps - 2 hold - Standing Tandem Balance with Counter Support  - 1 x daily - 7 x weekly - 4 sets - 4 reps - 20 hold  Access Code: [email or web address] URL: [email or web address] Date: 10/29/2022 Prepared by: Maureen Ralphs  Exercises - Single Leg Stance  - 3 x weekly - 5-10 sets - as long as possible-  hold - Tandem Stance  - 3 x weekly - 3 sets - as long as possible hold   GOALS: Goals reviewed with patient? Yes  SHORT TERM GOALS: Target date: 12/10/2022  Patient will be independent with HEP in order to improve strength and balance in order to decrease fall risk and improve function at home and work.   Baseline: EVAL- No formal HEP in place Goal  status: INITIAL  LONG TERM GOALS: Target date: 01/21/2023  1.  Patient (> 2 years old) will complete five times sit to stand test in < 15 seconds indicating an increased LE strength and improved balance. Baseline: EVAL=58 Goal status: INITIAL  2.  Patient will increase FOTO score to equal to or greater than  59   to demonstrate statistically significant improvement in mobility and quality of life.  Baseline: EVAL= 58 Goal status: INITIAL   3.  Patient will increase Berg Balance score by > 3 points to demonstrate decreased fall risk during functional activities. Baseline: EVAL=50/56 Goal status: INITIAL  4.   Patient will demonstrate improved dynamic standing balance as seen by single leg standing > 10 sec each LE  Baseline: EVAL=2-3 sec each LE Goal status: INITIAL  ASSESSMENT:  CLINICAL IMPRESSION: Patient presents with good motivation for completion of physical therapy activities. PT treatment continued to address dynamic and staic balance deficits. Pt showing increased utilization of ankle strategies with balance with training and cues provided today.  Pt will continue to benefit from skilled physical therapy intervention to address impairments, improve QOL, and attain therapy goals.     OBJECTIVE IMPAIRMENTS: decreased balance, decreased coordination, difficulty walking, decreased strength, and postural dysfunction.   ACTIVITY LIMITATIONS: carrying, lifting, bending, standing, and squatting  PARTICIPATION LIMITATIONS: community activity and yard work  PERSONAL FACTORS: 1-2 comorbidities: HTN; DM  are also affecting patient's functional outcome.   REHAB POTENTIAL: Good  CLINICAL DECISION MAKING: Stable/uncomplicated  EVALUATION COMPLEXITY: Low  PLAN:  PT FREQUENCY: 1-2x/week  PT DURATION: 12 weeks  PLANNED INTERVENTIONS: Therapeutic exercises, Therapeutic activity, Neuromuscular re-education, Balance training, Gait training, Patient/Family education, Self Care,  Joint mobilization, Joint manipulation, Stair training, Vestibular training, Canalith repositioning, DME instructions, Dry Needling, Electrical stimulation, Spinal manipulation, Spinal mobilization, Cryotherapy, Moist heat, Taping, Manual therapy, and Re-evaluation  PLAN FOR NEXT SESSION:   Dynamic and balance. Core stabilization/strengthening.      Norman Herrlich PT ,DPT Physical Therapist- [place]  [facility]   7:23 AM 11/29/22

## 2022-12-05 ENCOUNTER — Ambulatory Visit: Payer: Medicare Other | Admitting: Physical Therapy

## 2022-12-06 ENCOUNTER — Encounter: Payer: Self-pay | Admitting: Physical Therapy

## 2022-12-06 ENCOUNTER — Ambulatory Visit: Payer: Medicare Other | Attending: Internal Medicine | Admitting: Physical Therapy

## 2022-12-06 DIAGNOSIS — M6281 Muscle weakness (generalized): Secondary | ICD-10-CM

## 2022-12-06 DIAGNOSIS — R2681 Unsteadiness on feet: Secondary | ICD-10-CM

## 2022-12-06 DIAGNOSIS — R269 Unspecified abnormalities of gait and mobility: Secondary | ICD-10-CM

## 2022-12-06 DIAGNOSIS — R278 Other lack of coordination: Secondary | ICD-10-CM | POA: Insufficient documentation

## 2022-12-06 DIAGNOSIS — R262 Difficulty in walking, not elsewhere classified: Secondary | ICD-10-CM | POA: Diagnosis present

## 2022-12-06 NOTE — Therapy (Signed)
OUTPATIENT PHYSICAL THERAPY NEURO TREATMENT   Patient Name: Calvin Roth MRN: [id number] DOB:1945/06/05, 77 y.o., male Today's Date: 12/06/2022   PCP: Dr. Einar Crow REFERRING PROVIDER: Dr. Einar Crow  END OF SESSION:  PT End of Session - 12/06/22 0806     Visit Number 7    Number of Visits 24    Date for PT Re-Evaluation 01/21/23    Progress Note Due on Visit 10    PT Start Time 0806    PT Stop Time 0844    PT Time Calculation (min) 38 min    Equipment Utilized During Treatment Gait belt    Activity Tolerance Patient tolerated treatment well    Behavior During Therapy [facility] for tasks assessed/performed                 Past Medical History:  Diagnosis Date   Actinic keratosis    Aortic atherosclerosis (HCC)    Aortic root dilatation (HCC) 03/12/2019   a.) CTA 03/22/2019 --> 4.5 cm   Ascending aortic aneurysm (HCC) 10/31/2017   a.) fusiform; measured 4.4 cm   BPH (benign prostatic hyperplasia)    CKD (chronic kidney disease), stage III (HCC)    Coronary artery disease    DDD (degenerative disc disease), lumbar    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 Moderate to marked atypia, edge involved. Excised 09/12/2006, margins free.   Dysplastic nevus 11/19/2007   Left costal margin/infrapectoral. Slight to moderate atypia, margin focally involved.   Dysplastic nevus 11/19/2007   Right lateral flank near waistline. Moderate atypia, extends to one edge.    Dysplastic nevus 05/25/2019   Left lateral tricep. Moderate atypia, deep margin involved.    GERD (gastroesophageal reflux disease)    Hepatic steatosis    Hyperlipidemia    Hypertension    Intermittent alternating exotropia    Neurogenic bladder    a.) self catheriterizes FOUR times daily   Osteoarthritis    Serrated adenoma of colon 07/17/2016   Sliding hiatal hernia    T2DM (type 2 diabetes mellitus) (HCC)    Past Surgical  History:  Procedure Laterality Date   CATARACT EXTRACTION Left 05/15/2018   CATARACT EXTRACTION Right 04/03/2018   CHOLECYSTECTOMY     COLONOSCOPY WITH PROPOFOL N/A 07/17/2016   Procedure: COLONOSCOPY WITH PROPOFOL;  Surgeon: Christena Deem, MD;  Location: [facility] ENDOSCOPY;  Service: Endoscopy;  Laterality: N/A;   COLONOSCOPY WITH PROPOFOL N/A 02/04/2018   Procedure: COLONOSCOPY WITH PROPOFOL;  Surgeon: Christena Deem, MD;  Location: [facility] ENDOSCOPY;  Service: Endoscopy;  Laterality: N/A;   EYE SURGERY Left 2000   Strabismus correction   FOREIGN BODY REMOVAL Left 10/23/2019   Procedure: REMOVAL FOREIGN BODY LEFT HEEL;  Surgeon: Gwyneth Revels, DPM;  Location: [facility] ORS;  Service: Podiatry;  Laterality: Left;   GASTRIC RESTRICTION SURGERY N/A 1982   "stomach stapled for weight loss"   LUMBAR FUSION N/A 02/10/2020   Procedure: PLIF,IP,PSI L2- L3;EXPL FUSION; Location: [facility]; Surgeon: Tressie Stalker, MD   POSTERIOR LUMBAR FUSION N/A 05/04/2015   Procedure: LUMBAR THREE-FOUR, LUMBAR FOUR-FIVE POSTERIOR LUMBAR FUSION; Location: [facility]; Surgeon: Tressie Stalker, MD   TONSILLECTOMY Bilateral 1954   TRANSANAL EXCISION OF RECTAL MASS N/A 09/18/2016   Procedure: TRANSANAL EXCISION OF RECTAL TUMOR;  Surgeon: Kieth Brightly, MD;  Location: [facility] ORS;  Service: General;  Laterality: N/A;   ULNAR COLLATERAL LIGAMENT REPAIR Left 12/28/2020  Procedure: LEFT THUMB ULNAR COLLATERAL LIGAMENT REPAIR/RECONSTRUCTION;  Surgeon: Christena Flake, MD;  Location: [facility] ORS;  Service: Orthopedics;  Laterality: Left;   Patient Active Problem List   Diagnosis Date Noted   Spondylolisthesis of lumbar region 05/04/2015   Degeneration of intervertebral disc of lumbar region 10/19/2014   Neuritis or radiculitis due to rupture of lumbar intervertebral disc 10/19/2014   Lumbar canal stenosis 10/19/2014   Morbid obesity (HCC) 01/23/2014   Benign prostatic hyperplasia with urinary  obstruction 10/01/2013   Type 2 diabetes mellitus (HCC) 10/01/2013   Benign hypertension 10/01/2013   Spinal stenosis 10/01/2013    ONSET DATE: around July 2023  REFERRING DIAG: R27.0 (ICD-10-CM) - Ataxia, unspecified   THERAPY DIAG:  Unsteadiness on feet  Abnormality of gait and mobility  Difficulty in walking, not elsewhere classified  Muscle weakness (generalized)  Rationale for Evaluation and Treatment: Rehabilitation  SUBJECTIVE:                                                                                                                                                                                             SUBJECTIVE STATEMENT:  Pt reports doing well today. States the wedding went well over the weekend and he got a lot of walking in and he got around well.    Pt accompanied by: self  PERTINENT HISTORY: Past Medical History:  Diagnosis Date  Diabetes mellitus type 2, uncomplicated (CMS/HHS-HCC)  Hyperlipidemia  Hypertension  Serrated adenoma of colon 07/17/2016   Past Surgical History:  Procedure Laterality Date  COLONOSCOPY 07/17/2016  Serrated adenoma/Repeat 57yr/MUS  COLONOSCOPY 02/04/2018  Tubular adenoma of the colon/Hyperplastic colon polyp/Repeat 74yrs/MUS  CATARACT EXTRACTION W/ INTRAOCULAR LENS IMPLANT Right 04/2018  LENS EYE SURGERY Right 04/03/2018  standard monofocal  CATARACT EXTRACTION Left 05/2018  LENS EYE SURGERY Left 05/15/2018  back surgery 02/10/2020  fused L2 and L3  Primary repair of ulnar collateral ligament with internal brace augmentation, left thumb Left 12/28/2020  Dr.Poggi  COLONOSCOPY 01/25/2022  PHxCP/normal colon/no repeat/CTL  CHOLECYSTECTOMY  COLONOSCOPY 04/15/2004, 07/13/2009  Correction of strabismus, left eye  EXPLORATION OF SPINAL FUSION  spinal fusion 05/2015  Gastric stapling  TONSILLECTOMY    PAIN:  Are you having pain? No  PRECAUTIONS: Fall  RED FLAGS: None   WEIGHT BEARING RESTRICTIONS:  No  FALLS: Has patient fallen in last 6 months? No  LIVING ENVIRONMENT: Lives with: lives with their spouse Lives in: House/apartment Stairs: Yes: Internal: 10-14 steps; on right going up and External: 5 steps; on left going up Has following equipment at home: None  PLOF: Independent  PATIENT GOALS: I would like to be  more stable and stand on 1 foot and have better balance. I want to learn ways to regain balance  OBJECTIVE:   DIAGNOSTIC FINDINGS: none recent  COGNITION: Overall cognitive status: Within functional limits for tasks assessed   SENSATION: WFL- in all extremities  COORDINATION: Intact with heel to shin BLE  EDEMA:  None observed  LOWER EXTREMITY MMT:    MMT Right Eval Left Eval  Hip flexion 4 4  Hip extension 4 4  Hip abduction 4 4  Hip adduction 4 4  Hip internal rotation 4 4  Hip external rotation 4 4  Knee flexion 4 4  Knee extension 4 4  Ankle dorsiflexion 4 4  Ankle plantarflexion    Ankle inversion 4 4  Ankle eversion 4 4  (Blank rows = not tested)   TRANSFERS: Assistive device utilized: None  Sit to stand: Complete Independence Stand to sit: Complete Independence Chair to chair: Complete Independence Floor:  not tested   GAIT: Gait pattern: decreased arm swing- Right, decreased arm swing- Left, decreased step length- Right, and decreased step length- Left Distance walked: 100+ Assistive device utilized: None Level of assistance: Complete Independence  FUNCTIONAL TESTS:  5 times sit to stand: 16.01 sec without UE support Timed up and go (TUG): 10.91 sec without UE support 10 meter walk test: 1.0 m/s Berg Balance Scale: 50/56  PATIENT SURVEYS:  FOTO 58 with goal of 4  TODAY'S TREATMENT:                                                                                                                              DATE: 12/06/22  TE   Standing march x 10 ea LE with 5# AW   Ladder drillls with 5# AW (for step length and  picking up feet)  X 8 times through normal walking   Heel toe raise - seated, 5# AW donned, 20 degree incine to increase ROM 2 x 15 - seated toe raise 2*20    Kore balance trainer working on patients proprioception and standing balance on dynamic surface  Setting type(s): tux racer, harder course at length  Reps: 2 Comments: increased difficulty with ant weight shift compared to post   Airex beam  X 60 sec ea ea condition  Even heels and toes hanging off x 1 , toes on pad , heels off x 1 and toes off x 1 working on a/p balance adjustment with ankle strategies.   Throughout session, PT provided gait  belt  CGA with min tactile cues for awareness of lateral and posterior LOB.      PATIENT EDUCATION: Education details: Pt educated throughout session about proper posture and technique with exercises. Improved exercise technique, movement at target joints, use of target muscles after min to mod verbal, visual, tactile cues.  Person educated: Patient Education method: Explanation Education comprehension: verbalized understanding  HOME EXERCISE PROGRAM: Access Code: [email or web address] URL: [email or web address] Date: 11/22/2022 Prepared by: Grier Rocher  Exercises - Standing Forward Step Taps with Counter Support  - 1 x daily - 7 x weekly - 3 sets - 12 reps - Romberg Stance with Eyes Closed  - 1 x daily - 7 x weekly - 3 sets - 3 reps - 20 hold - Romberg Stance with Head Nods  - 1 x daily - 7 x weekly - 3 sets - 10 reps - Romberg Stance with Head Rotation  - 1 x daily - 7 x weekly - 3 sets - 10 reps - Seated Hip Abduction with Resistance  - 1 x daily - 7 x weekly - 3 sets - 10 reps - 3 hold - Sitting Knee Extension with Resistance  - 1 x daily - 7 x weekly - 3 sets - 10 reps - 3 hold - Seated March with Resistance  - 1 x daily - 7 x weekly - 3 sets - 10 reps - 2 hold - Standing Tandem Balance with Counter Support  - 1 x daily - 7 x weekly - 4 sets - 4 reps - 20 hold  Access  Code: [email or web address] URL: [email or web address] Date: 10/29/2022 Prepared by: Maureen Ralphs  Exercises - Single Leg Stance  - 3 x weekly - 5-10 sets - as long as possible-  hold - Tandem Stance  - 3 x weekly - 3 sets - as long as possible hold   GOALS: Goals reviewed with patient? Yes  SHORT TERM GOALS: Target date: 12/10/2022  Patient will be independent with HEP in order to improve strength and balance in order to decrease fall risk and improve function at home and work.   Baseline: EVAL- No formal HEP in place Goal status: INITIAL  LONG TERM GOALS: Target date: 01/21/2023  1.  Patient (> 40 years old) will complete five times sit to stand test in < 15 seconds indicating an increased LE strength and improved balance. Baseline: EVAL=58 Goal status: INITIAL  2.  Patient will increase FOTO score to equal to or greater than  59   to demonstrate statistically significant improvement in mobility and quality of life.  Baseline: EVAL= 58 Goal status: INITIAL   3.  Patient will increase Berg Balance score by > 3 points to demonstrate decreased fall risk during functional activities. Baseline: EVAL=50/56 Goal status: INITIAL  4.   Patient will demonstrate improved dynamic standing balance as seen by single leg standing > 10 sec each LE  Baseline: EVAL=2-3 sec each LE Goal status: INITIAL  ASSESSMENT:  CLINICAL IMPRESSION:  Patient presents with good motivation for completion of physical therapy activities. PT treatment continued to address dynamic and staic balance deficits. Pt showing increased utilization of ankle strategies with balance with training and cues provided today. Still having difficulty with kore balance tasks but improving with practice. Pt will continue to benefit from skilled physical therapy intervention to address impairments, improve QOL, and attain therapy goals.     OBJECTIVE IMPAIRMENTS: decreased balance, decreased coordination, difficulty  walking, decreased strength, and postural dysfunction.   ACTIVITY LIMITATIONS: carrying, lifting, bending, standing, and squatting  PARTICIPATION LIMITATIONS: community activity and yard work  PERSONAL FACTORS: 1-2 comorbidities: HTN; DM  are also affecting patient's functional outcome.   REHAB POTENTIAL: Good  CLINICAL DECISION MAKING: Stable/uncomplicated  EVALUATION COMPLEXITY: Low  PLAN:  PT FREQUENCY: 1-2x/week  PT DURATION: 12 weeks  PLANNED INTERVENTIONS: Therapeutic exercises, Therapeutic activity, Neuromuscular re-education, Balance training, Gait training, Patient/Family education, Self Care, Joint mobilization, Joint manipulation, Stair training, Vestibular training, Canalith repositioning, DME  instructions, Dry Needling, Electrical stimulation, Spinal manipulation, Spinal mobilization, Cryotherapy, Moist heat, Taping, Manual therapy, and Re-evaluation  PLAN FOR NEXT SESSION:   Dynamic and balance. Core stabilization/strengthening.      Norman Herrlich PT ,DPT Physical Therapist- [facility]   8:08 AM 12/06/22

## 2022-12-11 ENCOUNTER — Other Ambulatory Visit: Payer: Self-pay | Admitting: Thoracic Surgery (Cardiothoracic Vascular Surgery)

## 2022-12-11 ENCOUNTER — Ambulatory Visit: Payer: Medicare Other | Admitting: Physical Therapy

## 2022-12-11 DIAGNOSIS — R278 Other lack of coordination: Secondary | ICD-10-CM

## 2022-12-11 DIAGNOSIS — R262 Difficulty in walking, not elsewhere classified: Secondary | ICD-10-CM

## 2022-12-11 DIAGNOSIS — I7121 Aneurysm of the ascending aorta, without rupture: Secondary | ICD-10-CM

## 2022-12-11 DIAGNOSIS — R2681 Unsteadiness on feet: Secondary | ICD-10-CM | POA: Diagnosis not present

## 2022-12-11 DIAGNOSIS — R269 Unspecified abnormalities of gait and mobility: Secondary | ICD-10-CM

## 2022-12-11 DIAGNOSIS — M6281 Muscle weakness (generalized): Secondary | ICD-10-CM

## 2022-12-11 NOTE — Therapy (Signed)
OUTPATIENT PHYSICAL THERAPY NEURO TREATMENT   Patient Name: Calvin Roth MRN: [id number] DOB:01-05-1946, 77 y.o., male Today's Date: 12/11/2022   PCP: Dr. Einar Crow REFERRING PROVIDER: Dr. Einar Crow  END OF SESSION:  PT End of Session - 12/11/22 1448     Visit Number 8    Number of Visits 24    Date for PT Re-Evaluation 01/21/23    Progress Note Due on Visit 10    PT Start Time 1450    PT Stop Time 1530    PT Time Calculation (min) 40 min    Equipment Utilized During Treatment Gait belt    Activity Tolerance Patient tolerated treatment well    Behavior During Therapy [facility] for tasks assessed/performed                 Past Medical History:  Diagnosis Date   Actinic keratosis    Aortic atherosclerosis (HCC)    Aortic root dilatation (HCC) 03/12/2019   a.) CTA 03/22/2019 --> 4.5 cm   Ascending aortic aneurysm (HCC) 10/31/2017   a.) fusiform; measured 4.4 cm   BPH (benign prostatic hyperplasia)    CKD (chronic kidney disease), stage III (HCC)    Coronary artery disease    DDD (degenerative disc disease), lumbar    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 Moderate to marked atypia, edge involved. Excised 09/12/2006, margins free.   Dysplastic nevus 11/19/2007   Left costal margin/infrapectoral. Slight to moderate atypia, margin focally involved.   Dysplastic nevus 11/19/2007   Right lateral flank near waistline. Moderate atypia, extends to one edge.    Dysplastic nevus 05/25/2019   Left lateral tricep. Moderate atypia, deep margin involved.    GERD (gastroesophageal reflux disease)    Hepatic steatosis    Hyperlipidemia    Hypertension    Intermittent alternating exotropia    Neurogenic bladder    a.) self catheriterizes FOUR times daily   Osteoarthritis    Serrated adenoma of colon 07/17/2016   Sliding hiatal hernia    T2DM (type 2 diabetes mellitus) (HCC)    Past Surgical  History:  Procedure Laterality Date   CATARACT EXTRACTION Left 05/15/2018   CATARACT EXTRACTION Right 04/03/2018   CHOLECYSTECTOMY     COLONOSCOPY WITH PROPOFOL N/A 07/17/2016   Procedure: COLONOSCOPY WITH PROPOFOL;  Surgeon: Christena Deem, MD;  Location: [facility] ENDOSCOPY;  Service: Endoscopy;  Laterality: N/A;   COLONOSCOPY WITH PROPOFOL N/A 02/04/2018   Procedure: COLONOSCOPY WITH PROPOFOL;  Surgeon: Christena Deem, MD;  Location[facility] ENDOSCOPY;  Service: Endoscopy;  Laterality: N/A;   EYE SURGERY Left 2000   Strabismus correction   FOREIGN BODY REMOVAL Left 10/23/2019   Procedure: REMOVAL FOREIGN BODY LEFT HEEL;  Surgeon: Gwyneth Revels, DPM;  Location: [facility] ORS;  Service: Podiatry;  Laterality: Left;   GASTRIC RESTRICTION SURGERY N/A 1982   "stomach stapled for weight loss"   LUMBAR FUSION N/A 02/10/2020   Procedure: PLIF,IP,PSI L2- L3;EXPL FUSION; Location: [facility]; Surgeon: Tressie Stalker, MD   POSTERIOR LUMBAR FUSION N/A 05/04/2015   Procedure: LUMBAR THREE-FOUR, LUMBAR FOUR-FIVE POSTERIOR LUMBAR FUSION; Location: [facility]; Surgeon: Tressie Stalker, MD   TONSILLECTOMY Bilateral 1954   TRANSANAL EXCISION OF RECTAL MASS N/A 09/18/2016   Procedure: TRANSANAL EXCISION OF RECTAL TUMOR;  Surgeon: Kieth Brightly, MD;  Location: [facility] ORS;  Service: General;  Laterality: N/A;   ULNAR COLLATERAL LIGAMENT REPAIR Left 12/28/2020  Procedure: LEFT THUMB ULNAR COLLATERAL LIGAMENT REPAIR/RECONSTRUCTION;  Surgeon: Christena Flake, MD;  Location: [facility] ORS;  Service: Orthopedics;  Laterality: Left;   Patient Active Problem List   Diagnosis Date Noted   Spondylolisthesis of lumbar region 05/04/2015   Degeneration of intervertebral disc of lumbar region 10/19/2014   Neuritis or radiculitis due to rupture of lumbar intervertebral disc 10/19/2014   Lumbar canal stenosis 10/19/2014   Morbid obesity (HCC) 01/23/2014   Benign prostatic hyperplasia with urinary  obstruction 10/01/2013   Type 2 diabetes mellitus (HCC) 10/01/2013   Benign hypertension 10/01/2013   Spinal stenosis 10/01/2013    ONSET DATE: around July 2023  REFERRING DIAG: R27.0 (ICD-10-CM) - Ataxia, unspecified   THERAPY DIAG:  Unsteadiness on feet  Abnormality of gait and mobility  Difficulty in walking, not elsewhere classified  Muscle weakness (generalized)  Other lack of coordination  Rationale for Evaluation and Treatment: Rehabilitation  SUBJECTIVE:                                                                                                                                                                                             SUBJECTIVE STATEMENT:  Pt reports doing well today. No pain reported by Pt. Uneventful weekend. Plans to clear garden bed over the upcoming week.    Pt accompanied by: self  PERTINENT HISTORY: Past Medical History:  Diagnosis Date  Diabetes mellitus type 2, uncomplicated (CMS/HHS-HCC)  Hyperlipidemia  Hypertension  Serrated adenoma of colon 07/17/2016   Past Surgical History:  Procedure Laterality Date  COLONOSCOPY 07/17/2016  Serrated adenoma/Repeat 36yr/MUS  COLONOSCOPY 02/04/2018  Tubular adenoma of the colon/Hyperplastic colon polyp/Repeat 27yrs/MUS  CATARACT EXTRACTION W/ INTRAOCULAR LENS IMPLANT Right 04/2018  LENS EYE SURGERY Right 04/03/2018  standard monofocal  CATARACT EXTRACTION Left 05/2018  LENS EYE SURGERY Left 05/15/2018  back surgery 02/10/2020  fused L2 and L3  Primary repair of ulnar collateral ligament with internal brace augmentation, left thumb Left 12/28/2020  Dr.Poggi  COLONOSCOPY 01/25/2022  PHxCP/normal colon/no repeat/CTL  CHOLECYSTECTOMY  COLONOSCOPY 04/15/2004, 07/13/2009  Correction of strabismus, left eye  EXPLORATION OF SPINAL FUSION  spinal fusion 05/2015  Gastric stapling  TONSILLECTOMY    PAIN:  Are you having pain? No  PRECAUTIONS: Fall  RED FLAGS: None   WEIGHT BEARING  RESTRICTIONS: No  FALLS: Has patient fallen in last 6 months? No  LIVING ENVIRONMENT: Lives with: lives with their spouse Lives in: House/apartment Stairs: Yes: Internal: 10-14 steps; on right going up and External: 5 steps; on left going up Has following equipment at home: None  PLOF: Independent  PATIENT GOALS: I would like to be  more stable and stand on 1 foot and have better balance. I want to learn ways to regain balance  OBJECTIVE:   DIAGNOSTIC FINDINGS: none recent  COGNITION: Overall cognitive status: Within functional limits for tasks assessed   SENSATION: WFL- in all extremities  COORDINATION: Intact with heel to shin BLE  EDEMA:  None observed  LOWER EXTREMITY MMT:    MMT Right Eval Left Eval  Hip flexion 4 4  Hip extension 4 4  Hip abduction 4 4  Hip adduction 4 4  Hip internal rotation 4 4  Hip external rotation 4 4  Knee flexion 4 4  Knee extension 4 4  Ankle dorsiflexion 4 4  Ankle plantarflexion    Ankle inversion 4 4  Ankle eversion 4 4  (Blank rows = not tested)   TRANSFERS: Assistive device utilized: None  Sit to stand: Complete Independence Stand to sit: Complete Independence Chair to chair: Complete Independence Floor:  not tested   GAIT: Gait pattern: decreased arm swing- Right, decreased arm swing- Left, decreased step length- Right, and decreased step length- Left Distance walked: 100+ Assistive device utilized: None Level of assistance: Complete Independence  FUNCTIONAL TESTS:  5 times sit to stand: 16.01 sec without UE support Timed up and go (TUG): 10.91 sec without UE support 10 meter walk test: 1.0 m/s Berg Balance Scale: 50/56  PATIENT SURVEYS:  FOTO 58 with goal of 57  TODAY'S TREATMENT:                                                                                                                              DATE: 12/11/22   Nustep BLE and BUE reciprocal movement training  Rocker board AP rocks x 20 each   Static hold 3 x 30 sec  Lateral rocks x 20  Lateral static hold 3 x 30 sec   Balance disc: 1 foot on disc 1 foot on 6 inch step 3 x 20 sec bil    Sit<>stand with 2inch lift 2 x 6 bil  Staggered stance sit<>stand x 5 bil   CGA and cues for ankle strategy to correct lateral LOB intermittently on rocker board and balance disc as well as cues for proper weight shift with with staggered stance transfers.   PATIENT EDUCATION: Education details: Pt educated throughout session about proper posture and technique with exercises. Improved exercise technique, movement at target joints, use of target muscles after min to mod verbal, visual, tactile cues.  Person educated: Patient Education method: Explanation Education comprehension: verbalized understanding  HOME EXERCISE PROGRAM: Access Code: [email or web address] URL: [email or web address] Date: 11/22/2022 Prepared by: Grier Rocher  Exercises - Standing Forward Step Taps with Counter Support  - 1 x daily - 7 x weekly - 3 sets - 12 reps - Romberg Stance with Eyes Closed  - 1 x daily - 7 x weekly - 3 sets - 3 reps - 20 hold - Romberg Stance with Head Nods  - 1  x daily - 7 x weekly - 3 sets - 10 reps - Romberg Stance with Head Rotation  - 1 x daily - 7 x weekly - 3 sets - 10 reps - Seated Hip Abduction with Resistance  - 1 x daily - 7 x weekly - 3 sets - 10 reps - 3 hold - Sitting Knee Extension with Resistance  - 1 x daily - 7 x weekly - 3 sets - 10 reps - 3 hold - Seated March with Resistance  - 1 x daily - 7 x weekly - 3 sets - 10 reps - 2 hold - Standing Tandem Balance with Counter Support  - 1 x daily - 7 x weekly - 4 sets - 4 reps - 20 hold  Access Code: [email or web address] URL: [email or web address] Date: 10/29/2022 Prepared by: Maureen Ralphs  Exercises - Single Leg Stance  - 3 x weekly - 5-10 sets - as long as possible-  hold - Tandem Stance  - 3 x weekly - 3 sets - as long as possible hold   GOALS: Goals reviewed  with patient? Yes  SHORT TERM GOALS: Target date: 12/10/2022  Patient will be independent with HEP in order to improve strength and balance in order to decrease fall risk and improve function at home and work.   Baseline: EVAL- No formal HEP in place Goal status: INITIAL  LONG TERM GOALS: Target date: 01/21/2023  1.  Patient (> 52 years old) will complete five times sit to stand test in < 15 seconds indicating an increased LE strength and improved balance. Baseline: EVAL=58 Goal status: INITIAL  2.  Patient will increase FOTO score to equal to or greater than  59   to demonstrate statistically significant improvement in mobility and quality of life.  Baseline: EVAL= 58 Goal status: INITIAL   3.  Patient will increase Berg Balance score by > 3 points to demonstrate decreased fall risk during functional activities. Baseline: EVAL=50/56 Goal status: INITIAL  4.   Patient will demonstrate improved dynamic standing balance as seen by single leg standing > 10 sec each LE  Baseline: EVAL=2-3 sec each LE Goal status: INITIAL  ASSESSMENT:  CLINICAL IMPRESSION:  Patient presents with good motivation for completion of physical therapy activities. PT treatment continued to address dynamic and staic balance deficits. Pt demonstrated improved ankle strategy to correct lateral LOB, but was noted to require UE support intermittently with lateral rocker board and decrease ankle stability on the LLE while on balance disc. Pt will continue to benefit from skilled physical therapy intervention to address impairments, improve QOL, and attain therapy goals.     OBJECTIVE IMPAIRMENTS: decreased balance, decreased coordination, difficulty walking, decreased strength, and postural dysfunction.   ACTIVITY LIMITATIONS: carrying, lifting, bending, standing, and squatting  PARTICIPATION LIMITATIONS: community activity and yard work  PERSONAL FACTORS: 1-2 comorbidities: HTN; DM  are also affecting  patient's functional outcome.   REHAB POTENTIAL: Good  CLINICAL DECISION MAKING: Stable/uncomplicated  EVALUATION COMPLEXITY: Low  PLAN:  PT FREQUENCY: 1-2x/week  PT DURATION: 12 weeks  PLANNED INTERVENTIONS: Therapeutic exercises, Therapeutic activity, Neuromuscular re-education, Balance training, Gait training, Patient/Family education, Self Care, Joint mobilization, Joint manipulation, Stair training, Vestibular training, Canalith repositioning, DME instructions, Dry Needling, Electrical stimulation, Spinal manipulation, Spinal mobilization, Cryotherapy, Moist heat, Taping, Manual therapy, and Re-evaluation  PLAN FOR NEXT SESSION:   Dynamic and balance with emphasis on single limb control.  Core stabilization/strengthening.      Golden Pop PT ,DPT Physical Therapist- [place]  [facility]   2:50 PM 12/11/22

## 2022-12-13 ENCOUNTER — Ambulatory Visit: Payer: Medicare Other | Admitting: Physical Therapy

## 2022-12-13 DIAGNOSIS — R269 Unspecified abnormalities of gait and mobility: Secondary | ICD-10-CM

## 2022-12-13 DIAGNOSIS — M6281 Muscle weakness (generalized): Secondary | ICD-10-CM

## 2022-12-13 DIAGNOSIS — R2681 Unsteadiness on feet: Secondary | ICD-10-CM | POA: Diagnosis not present

## 2022-12-13 DIAGNOSIS — R262 Difficulty in walking, not elsewhere classified: Secondary | ICD-10-CM

## 2022-12-13 NOTE — Therapy (Signed)
OUTPATIENT PHYSICAL THERAPY NEURO TREATMENT   Patient Name: Calvin Roth MRN: [id number] DOB:[birth year], 77 y.o., male Today's Date: 12/13/2022   PCP: Dr. Einar Crow REFERRING PROVIDER: Dr. Einar Crow  END OF SESSION:  PT End of Session - 12/13/22 0811     Visit Number 9    Number of Visits 24    Date for PT Re-Evaluation 01/21/23    Progress Note Due on Visit 10    PT Start Time 0809    PT Stop Time 0844    PT Time Calculation (min) 35 min    Equipment Utilized During Treatment Gait belt    Activity Tolerance Patient tolerated treatment well    Behavior During Therapy [facility] for tasks assessed/performed                  Past Medical History:  Diagnosis Date   Actinic keratosis    Aortic atherosclerosis (HCC)    Aortic root dilatation (HCC) 03/12/2019   a.) CTA 03/22/2019 --> 4.5 cm   Ascending aortic aneurysm (HCC) 10/31/2017   a.) fusiform; measured 4.4 cm   BPH (benign prostatic hyperplasia)    CKD (chronic kidney disease), stage III (HCC)    Coronary artery disease    DDD (degenerative disc disease), lumbar    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 Moderate to marked atypia, edge involved. Excised 09/12/2006, margins free.   Dysplastic nevus 11/19/2007   Left costal margin/infrapectoral. Slight to moderate atypia, margin focally involved.   Dysplastic nevus 11/19/2007   Right lateral flank near waistline. Moderate atypia, extends to one edge.    Dysplastic nevus 05/25/2019   Left lateral tricep. Moderate atypia, deep margin involved.    GERD (gastroesophageal reflux disease)    Hepatic steatosis    Hyperlipidemia    Hypertension    Intermittent alternating exotropia    Neurogenic bladder    a.) self catheriterizes FOUR times daily   Osteoarthritis    Serrated adenoma of colon 07/17/2016   Sliding hiatal hernia    T2DM (type 2 diabetes mellitus) (HCC)    Past Surgical  History:  Procedure Laterality Date   CATARACT EXTRACTION Left 05/15/2018   CATARACT EXTRACTION Right 04/03/2018   CHOLECYSTECTOMY     COLONOSCOPY WITH PROPOFOL N/A 07/17/2016   Procedure: COLONOSCOPY WITH PROPOFOL;  Surgeon: Christena Deem, MD;  Location: [facility] ENDOSCOPY;  Service: Endoscopy;  Laterality: N/A;   COLONOSCOPY WITH PROPOFOL N/A 02/04/2018   Procedure: COLONOSCOPY WITH PROPOFOL;  Surgeon: Christena Deem, MD;  Location: [facility] ENDOSCOPY;  Service: Endoscopy;  Laterality: N/A;   EYE SURGERY Left 2000   Strabismus correction   FOREIGN BODY REMOVAL Left 10/23/2019   Procedure: REMOVAL FOREIGN BODY LEFT HEEL;  Surgeon: Gwyneth Revels, DPM;  Location: [facility] ORS;  Service: Podiatry;  Laterality: Left;   GASTRIC RESTRICTION SURGERY N/A 1982   "stomach stapled for weight loss"   LUMBAR FUSION N/A 02/10/2020   Procedure: PLIF,IP,PSI L2- L3;EXPL FUSION; Location: [facility]; Surgeon: Tressie Stalker, MD   POSTERIOR LUMBAR FUSION N/A 05/04/2015   Procedure: LUMBAR THREE-FOUR, LUMBAR FOUR-FIVE POSTERIOR LUMBAR FUSION; Location: [facility]; Surgeon: Tressie Stalker, MD   TONSILLECTOMY Bilateral 1954   TRANSANAL EXCISION OF RECTAL MASS N/A 09/18/2016   Procedure: TRANSANAL EXCISION OF RECTAL TUMOR;  Surgeon: Kieth Brightly, MD;  Location: [facility] ORS;  Service: General;  Laterality: N/A;   ULNAR COLLATERAL LIGAMENT REPAIR Left 12/28/2020  Procedure: LEFT THUMB ULNAR COLLATERAL LIGAMENT REPAIR/RECONSTRUCTION;  Surgeon: Christena Flake, MD;  Location: [facility] ORS;  Service: Orthopedics;  Laterality: Left;   Patient Active Problem List   Diagnosis Date Noted   Spondylolisthesis of lumbar region 05/04/2015   Degeneration of intervertebral disc of lumbar region 10/19/2014   Neuritis or radiculitis due to rupture of lumbar intervertebral disc 10/19/2014   Lumbar canal stenosis 10/19/2014   Morbid obesity (HCC) 01/23/2014   Benign prostatic hyperplasia with urinary  obstruction 10/01/2013   Type 2 diabetes mellitus (HCC) 10/01/2013   Benign hypertension 10/01/2013   Spinal stenosis 10/01/2013    ONSET DATE: around July 2023  REFERRING DIAG: R27.0 (ICD-10-CM) - Ataxia, unspecified   THERAPY DIAG:  Unsteadiness on feet  Abnormality of gait and mobility  Difficulty in walking, not elsewhere classified  Muscle weakness (generalized)  Rationale for Evaluation and Treatment: Rehabilitation  SUBJECTIVE:                                                                                                                                                                                             SUBJECTIVE STATEMENT:  Pt reports doing well today.  Patient reports is able to move a lot of limbs and take them to the dump recently without any significant imbalance noted during this.  Pt accompanied by: self  PERTINENT HISTORY: Past Medical History:  Diagnosis Date  Diabetes mellitus type 2, uncomplicated (CMS/HHS-HCC)  Hyperlipidemia  Hypertension  Serrated adenoma of colon 07/17/2016   Past Surgical History:  Procedure Laterality Date  COLONOSCOPY 07/17/2016  Serrated adenoma/Repeat 42yr/MUS  COLONOSCOPY 02/04/2018  Tubular adenoma of the colon/Hyperplastic colon polyp/Repeat 45yrs/MUS  CATARACT EXTRACTION W/ INTRAOCULAR LENS IMPLANT Right 04/2018  LENS EYE SURGERY Right 04/03/2018  standard monofocal  CATARACT EXTRACTION Left 05/2018  LENS EYE SURGERY Left 05/15/2018  back surgery 02/10/2020  fused L2 and L3  Primary repair of ulnar collateral ligament with internal brace augmentation, left thumb Left 12/28/2020  Dr.Poggi  COLONOSCOPY 01/25/2022  PHxCP/normal colon/no repeat/CTL  CHOLECYSTECTOMY  COLONOSCOPY 04/15/2004, 07/13/2009  Correction of strabismus, left eye  EXPLORATION OF SPINAL FUSION  spinal fusion 05/2015  Gastric stapling  TONSILLECTOMY    PAIN:  Are you having pain? No  PRECAUTIONS: Fall  RED FLAGS: None   WEIGHT  BEARING RESTRICTIONS: No  FALLS: Has patient fallen in last 6 months? No  LIVING ENVIRONMENT: Lives with: lives with their spouse Lives in: House/apartment Stairs: Yes: Internal: 10-14 steps; on right going up and External: 5 steps; on left going up Has following equipment at home: None  PLOF: Independent  PATIENT GOALS: I would like  to be more stable and stand on 1 foot and have better balance. I want to learn ways to regain balance  OBJECTIVE:   DIAGNOSTIC FINDINGS: none recent  COGNITION: Overall cognitive status: Within functional limits for tasks assessed   SENSATION: WFL- in all extremities  COORDINATION: Intact with heel to shin BLE  EDEMA:  None observed  LOWER EXTREMITY MMT:    MMT Right Eval Left Eval  Hip flexion 4 4  Hip extension 4 4  Hip abduction 4 4  Hip adduction 4 4  Hip internal rotation 4 4  Hip external rotation 4 4  Knee flexion 4 4  Knee extension 4 4  Ankle dorsiflexion 4 4  Ankle plantarflexion    Ankle inversion 4 4  Ankle eversion 4 4  (Blank rows = not tested)   TRANSFERS: Assistive device utilized: None  Sit to stand: Complete Independence Stand to sit: Complete Independence Chair to chair: Complete Independence Floor:  not tested   GAIT: Gait pattern: decreased arm swing- Right, decreased arm swing- Left, decreased step length- Right, and decreased step length- Left Distance walked: 100+ Assistive device utilized: None Level of assistance: Complete Independence  FUNCTIONAL TESTS:  5 times sit to stand: 16.01 sec without UE support Timed up and go (TUG): 10.91 sec without UE support 10 meter walk test: 1.0 m/s Berg Balance Scale: 50/56  PATIENT SURVEYS:  FOTO 58 with goal of 22  TODAY'S TREATMENT:                                                                                                                              DATE: 12/13/22   NMR Rocker board AP rocks x 20 each  Static hold 3 x 30 sec  Lateral  rocks x 20  Lateral static hold 3 x 30 sec  Healthy you balance pad perpendicular to feet 3 x 30 sec, intermittent UE assist needed.   TE Seated hip abduction 20 x 3 sec hold   Sit<>stand x6 reps  Sit to stand with heel raise at top x 6 reps   Seated heel raise on incline board with 7.5 pound ankle weights donned with 3 sets of 20 repetitions  Hamstring curls x 10 each lower extremity with blue Thera-Band  CGA and cues for ankle strategy to correct lateral LOB intermittently on rocker board and balance disc as well as cues for proper weight shift with with staggered stance transfers.   PATIENT EDUCATION: Education details: Pt educated throughout session about proper posture and technique with exercises. Improved exercise technique, movement at target joints, use of target muscles after min to mod verbal, visual, tactile cues.  Person educated: Patient Education method: Explanation Education comprehension: verbalized understanding  HOME EXERCISE PROGRAM: Access Code: [email or web address] URL: [email or web address] Date: 11/22/2022 Prepared by: Grier Rocher  Exercises - Standing Forward Step Taps with Counter Support  - 1 x daily - 7 x weekly - 3 sets - 12 reps -  Romberg Stance with Eyes Closed  - 1 x daily - 7 x weekly - 3 sets - 3 reps - 20 hold - Romberg Stance with Head Nods  - 1 x daily - 7 x weekly - 3 sets - 10 reps - Romberg Stance with Head Rotation  - 1 x daily - 7 x weekly - 3 sets - 10 reps - Seated Hip Abduction with Resistance  - 1 x daily - 7 x weekly - 3 sets - 10 reps - 3 hold - Sitting Knee Extension with Resistance  - 1 x daily - 7 x weekly - 3 sets - 10 reps - 3 hold - Seated March with Resistance  - 1 x daily - 7 x weekly - 3 sets - 10 reps - 2 hold - Standing Tandem Balance with Counter Support  - 1 x daily - 7 x weekly - 4 sets - 4 reps - 20 hold  Access Code: [email or web address] URL: [email or web address] Date: 10/29/2022 Prepared by: Maureen Ralphs  Exercises - Single Leg Stance  - 3 x weekly - 5-10 sets - as long as possible-  hold - Tandem Stance  - 3 x weekly - 3 sets - as long as possible hold   GOALS: Goals reviewed with patient? Yes  SHORT TERM GOALS: Target date: 12/10/2022  Patient will be independent with HEP in order to improve strength and balance in order to decrease fall risk and improve function at home and work.   Baseline: EVAL- No formal HEP in place Goal status: INITIAL  LONG TERM GOALS: Target date: 01/21/2023  1.  Patient (> 33 years old) will complete five times sit to stand test in < 15 seconds indicating an increased LE strength and improved balance. Baseline:  Goal status: INITIAL  2.  Patient will increase FOTO score to equal to or greater than  59   to demonstrate statistically significant improvement in mobility and quality of life.  Baseline: EVAL= 58 Goal status: INITIAL   3.  Patient will increase Berg Balance score by > 3 points to demonstrate decreased fall risk during functional activities. Baseline: EVAL=50/56 Goal status: INITIAL  4.   Patient will demonstrate improved dynamic standing balance as seen by single leg standing > 10 sec each LE  Baseline: EVAL=2-3 sec each LE Goal status: INITIAL  ASSESSMENT:  CLINICAL IMPRESSION:  Patient presents with good motivation for completion of physical therapy activities. PT treatment continued to address dynamic and staic balance deficits.  Patient also continues to progress with plantarflexion strengthening and shows improved plantarflexion muscle activation when standing on wobble board this date compared to previous notes.  Pt will continue to benefit from skilled physical therapy intervention to address impairments, improve QOL, and attain therapy goals.     OBJECTIVE IMPAIRMENTS: decreased balance, decreased coordination, difficulty walking, decreased strength, and postural dysfunction.   ACTIVITY LIMITATIONS: carrying,  lifting, bending, standing, and squatting  PARTICIPATION LIMITATIONS: community activity and yard work  PERSONAL FACTORS: 1-2 comorbidities: HTN; DM  are also affecting patient's functional outcome.   REHAB POTENTIAL: Good  CLINICAL DECISION MAKING: Stable/uncomplicated  EVALUATION COMPLEXITY: Low  PLAN:  PT FREQUENCY: 1-2x/week  PT DURATION: 12 weeks  PLANNED INTERVENTIONS: Therapeutic exercises, Therapeutic activity, Neuromuscular re-education, Balance training, Gait training, Patient/Family education, Self Care, Joint mobilization, Joint manipulation, Stair training, Vestibular training, Canalith repositioning, DME instructions, Dry Needling, Electrical stimulation, Spinal manipulation, Spinal mobilization, Cryotherapy, Moist heat, Taping, Manual therapy, and Re-evaluation  PLAN FOR NEXT SESSION:  Dynamic and balance with emphasis on single limb control.  Core stabilization/strengthening.     Norman Herrlich PT ,DPT Physical Therapist- [place]  [facility]   8:11 AM 12/13/22

## 2022-12-17 ENCOUNTER — Ambulatory Visit: Payer: Medicare Other

## 2022-12-17 DIAGNOSIS — M6281 Muscle weakness (generalized): Secondary | ICD-10-CM

## 2022-12-17 DIAGNOSIS — R262 Difficulty in walking, not elsewhere classified: Secondary | ICD-10-CM

## 2022-12-17 DIAGNOSIS — R278 Other lack of coordination: Secondary | ICD-10-CM

## 2022-12-17 DIAGNOSIS — R2681 Unsteadiness on feet: Secondary | ICD-10-CM

## 2022-12-17 DIAGNOSIS — R269 Unspecified abnormalities of gait and mobility: Secondary | ICD-10-CM

## 2022-12-17 NOTE — Therapy (Signed)
OUTPATIENT PHYSICAL THERAPY NEURO TREATMENT/PHYSICAL THERAPY PROGRESS NOTE   Dates of reporting period  10/29/22   to   12/17/22    Patient Name: Calvin Roth MRN: [id number] DOB:20-Aug-1945, 77 y.o., male Today's Date: 12/17/2022   PCP: Dr. Einar Crow REFERRING PROVIDER: Dr. Einar Crow  END OF SESSION:  PT End of Session - 12/17/22 1103     Visit Number 10    Number of Visits 24    Date for PT Re-Evaluation 01/21/23    Progress Note Due on Visit 10    PT Start Time 1103    PT Stop Time 1145    PT Time Calculation (min) 42 min    Equipment Utilized During Treatment Gait belt    Activity Tolerance Patient tolerated treatment well    Behavior During Therapy WFL for tasks assessed/performed               Past Medical History:  Diagnosis Date   Actinic keratosis    Aortic atherosclerosis (HCC)    Aortic root dilatation (HCC) 03/12/2019   a.) CTA 03/22/2019 --> 4.5 cm   Ascending aortic aneurysm (HCC) 10/31/2017   a.) fusiform; measured 4.4 cm   BPH (benign prostatic hyperplasia)    CKD (chronic kidney disease), stage III (HCC)    Coronary artery disease    DDD (degenerative disc disease), lumbar    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 Moderate to marked atypia, edge involved. Excised 09/12/2006, margins free.   Dysplastic nevus 11/19/2007   Left costal margin/infrapectoral. Slight to moderate atypia, margin focally involved.   Dysplastic nevus 11/19/2007   Right lateral flank near waistline. Moderate atypia, extends to one edge.    Dysplastic nevus 05/25/2019   Left lateral tricep. Moderate atypia, deep margin involved.    GERD (gastroesophageal reflux disease)    Hepatic steatosis    Hyperlipidemia    Hypertension    Intermittent alternating exotropia    Neurogenic bladder    a.) self catheriterizes FOUR times daily   Osteoarthritis    Serrated adenoma of colon 07/17/2016    Sliding hiatal hernia    T2DM (type 2 diabetes mellitus) (HCC)    Past Surgical History:  Procedure Laterality Date   CATARACT EXTRACTION Left 05/15/2018   CATARACT EXTRACTION Right 04/03/2018   CHOLECYSTECTOMY     COLONOSCOPY WITH PROPOFOL N/A 07/17/2016   Procedure: COLONOSCOPY WITH PROPOFOL;  Surgeon: Christena Deem, MD;  Location: [facility];  Service: Endoscopy;  Laterality: N/A;   COLONOSCOPY WITH PROPOFOL N/A 02/04/2018   Procedure: COLONOSCOPY WITH PROPOFOL;  Surgeon: Christena Deem, MD;  Location: [facility];  Service: Endoscopy;  Laterality: N/A;   EYE SURGERY Left 2000   Strabismus correction   FOREIGN BODY REMOVAL Left 10/23/2019   Procedure: REMOVAL FOREIGN BODY LEFT HEEL;  Surgeon: Gwyneth Revels, DPM;  Location: [facility];  Service: Podiatry;  Laterality: Left;   GASTRIC RESTRICTION SURGERY N/A 1982   "stomach stapled for weight loss"   LUMBAR FUSION N/A 02/10/2020   Procedure: PLIF,IP,PSI L2- L3;EXPL FUSION; Location: [facility]; Surgeon: Tressie Stalker, MD   POSTERIOR LUMBAR FUSION N/A 05/04/2015   Procedure: LUMBAR THREE-FOUR, LUMBAR FOUR-FIVE POSTERIOR LUMBAR FUSION; Location: [facility]; Surgeon: Tressie Stalker, MD   TONSILLECTOMY Bilateral 1954   TRANSANAL EXCISION OF RECTAL MASS N/A 09/18/2016   Procedure: TRANSANAL EXCISION OF RECTAL TUMOR;  Surgeon: Kieth Brightly, MD;  Location: [facility];  Service: General;  Laterality: N/A;   ULNAR COLLATERAL LIGAMENT REPAIR Left 12/28/2020   Procedure: LEFT THUMB ULNAR COLLATERAL LIGAMENT REPAIR/RECONSTRUCTION;  Surgeon: Christena Flake, MD;  Location: [facility];  Service: Orthopedics;  Laterality: Left;   Patient Active Problem List   Diagnosis Date Noted   Spondylolisthesis of lumbar region 05/04/2015   Degeneration of intervertebral disc of lumbar region 10/19/2014   Neuritis or radiculitis due to rupture of lumbar intervertebral disc 10/19/2014   Lumbar canal stenosis 10/19/2014    Morbid obesity (HCC) 01/23/2014   Benign prostatic hyperplasia with urinary obstruction 10/01/2013   Type 2 diabetes mellitus (HCC) 10/01/2013   Benign hypertension 10/01/2013   Spinal stenosis 10/01/2013    ONSET DATE: around July 2023  REFERRING DIAG: R27.0 (ICD-10-CM) - Ataxia, unspecified   THERAPY DIAG:  Unsteadiness on feet  Abnormality of gait and mobility  Difficulty in walking, not elsewhere classified  Muscle weakness (generalized)  Other lack of coordination  Rationale for Evaluation and Treatment: Rehabilitation  SUBJECTIVE:                                                                                                                                                                                             SUBJECTIVE STATEMENT:  Pt reports he is doing well and is having a good day.   Pt accompanied by: self  PERTINENT HISTORY: Past Medical History:  Diagnosis Date  Diabetes mellitus type 2, uncomplicated (CMS/HHS-HCC)  Hyperlipidemia  Hypertension  Serrated adenoma of colon 07/17/2016   Past Surgical History:  Procedure Laterality Date  COLONOSCOPY 07/17/2016  Serrated adenoma/Repeat 80yr/MUS  COLONOSCOPY 02/04/2018  Tubular adenoma of the colon/Hyperplastic colon polyp/Repeat 35yrs/MUS  CATARACT EXTRACTION W/ INTRAOCULAR LENS IMPLANT Right 04/2018  LENS EYE SURGERY Right 04/03/2018  standard monofocal  CATARACT EXTRACTION Left 05/2018  LENS EYE SURGERY Left 05/15/2018  back surgery 02/10/2020  fused L2 and L3  Primary repair of ulnar collateral ligament with internal brace augmentation, left thumb Left 12/28/2020  Dr.Poggi  COLONOSCOPY 01/25/2022  PHxCP/normal colon/no repeat/CTL  CHOLECYSTECTOMY  COLONOSCOPY 04/15/2004, 07/13/2009  Correction of strabismus, left eye  EXPLORATION OF SPINAL FUSION  spinal fusion 05/2015  Gastric stapling  TONSILLECTOMY    PAIN:  Are you having pain? No  PRECAUTIONS: Fall  RED FLAGS: None   WEIGHT  BEARING RESTRICTIONS: No  FALLS: Has patient fallen in last 6 months? No  LIVING ENVIRONMENT: Lives with: lives with their spouse Lives in: House/apartment Stairs: Yes: Internal: 10-14 steps; on right going up and External: 5 steps; on left going up Has following equipment at home: None  PLOF: Independent  PATIENT GOALS:  I would like to be more stable and stand on 1 foot and have better balance. I want to learn ways to regain balance  OBJECTIVE:   DIAGNOSTIC FINDINGS: none recent  COGNITION: Overall cognitive status: Within functional limits for tasks assessed   SENSATION: WFL- in all extremities  COORDINATION: Intact with heel to shin BLE  EDEMA:  None observed  LOWER EXTREMITY MMT:    MMT Right Eval Left Eval  Hip flexion 4 4  Hip extension 4 4  Hip abduction 4 4  Hip adduction 4 4  Hip internal rotation 4 4  Hip external rotation 4 4  Knee flexion 4 4  Knee extension 4 4  Ankle dorsiflexion 4 4  Ankle plantarflexion    Ankle inversion 4 4  Ankle eversion 4 4  (Blank rows = not tested)   TRANSFERS: Assistive device utilized: None  Sit to stand: Complete Independence Stand to sit: Complete Independence Chair to chair: Complete Independence Floor:  not tested   GAIT: Gait pattern: decreased arm swing- Right, decreased arm swing- Left, decreased step length- Right, and decreased step length- Left Distance walked: 100+ Assistive device utilized: None Level of assistance: Complete Independence  FUNCTIONAL TESTS:  5 times sit to stand: 16.01 sec without UE support Timed up and go (TUG): 10.91 sec without UE support 10 meter walk test: 1.0 m/s Berg Balance Scale: 50/56  PATIENT SURVEYS:  FOTO 58 with goal of 40  TODAY'S TREATMENT: DATE: 12/17/22   TherEx:  Seated ankle 4-ways with green theraband,   Seated hip abduction 20 x 3 sec hold   Goal assessment performed and noted below:    PATIENT EDUCATION: Education details: Pt educated  throughout session about proper posture and technique with exercises. Improved exercise technique, movement at target joints, use of target muscles after min to mod verbal, visual, tactile cues.  Person educated: Patient Education method: Explanation Education comprehension: verbalized understanding  HOME EXERCISE PROGRAM: Access Code: [email or web address] URL: [email or web address] Date: 11/22/2022 Prepared by: Grier Rocher  Exercises - Standing Forward Step Taps with Counter Support  - 1 x daily - 7 x weekly - 3 sets - 12 reps - Romberg Stance with Eyes Closed  - 1 x daily - 7 x weekly - 3 sets - 3 reps - 20 hold - Romberg Stance with Head Nods  - 1 x daily - 7 x weekly - 3 sets - 10 reps - Romberg Stance with Head Rotation  - 1 x daily - 7 x weekly - 3 sets - 10 reps - Seated Hip Abduction with Resistance  - 1 x daily - 7 x weekly - 3 sets - 10 reps - 3 hold - Sitting Knee Extension with Resistance  - 1 x daily - 7 x weekly - 3 sets - 10 reps - 3 hold - Seated March with Resistance  - 1 x daily - 7 x weekly - 3 sets - 10 reps - 2 hold - Standing Tandem Balance with Counter Support  - 1 x daily - 7 x weekly - 4 sets - 4 reps - 20 hold  Access Code: [email or web address] URL: [email or web address] Date: 10/29/2022 Prepared by: Maureen Ralphs  Exercises - Single Leg Stance  - 3 x weekly - 5-10 sets - as long as possible-  hold - Tandem Stance  - 3 x weekly - 3 sets - as long as possible hold   GOALS: Goals reviewed with patient? Yes  SHORT TERM GOALS: Target date: 12/10/2022  Patient will  be independent with HEP in order to improve strength and balance in order to decrease fall risk and improve function at home and work.  Baseline: EVAL- No formal HEP in place Goal status: INITIAL  LONG TERM GOALS: Target date: 01/21/2023  1.  Patient (> 35 years old) will complete five times sit to stand test in < 15 seconds indicating an increased LE strength and improved  balance. Baseline: 16.01 at eval 12/17/22: 9.88 sec Goal status: MET  2.  Patient will increase FOTO score to equal to or greater than  59   to demonstrate statistically significant improvement in mobility and quality of life.  Baseline: EVAL= 58 12/17/22: 63 Goal status: MET   3.  Patient will increase Berg Balance score by > 3 points to demonstrate decreased fall risk during functional activities. Baseline: EVAL=50/56 12/17/22: 52/56 Goal status: PROGRESSING  4.  Patient will demonstrate improved dynamic standing balance as seen by single leg standing > 10 sec each LE Baseline: EVAL=2-3 sec each LE 12/17/22: 3 sec on L LE, 2 sec on R LE Goal status: INITIAL  ASSESSMENT:  CLINICAL IMPRESSION:  Pt is making significant progress towards goals, but has not yet been able to improve upon his SLS times.  Pt will be focused on this moving forward in an effort to improve his overall stability, specifically for getting dressed and other activities that require SLS, such as walking.  Pt is agreeable to continue with therapy at this time and would benefit to improve his overall balance as he has been over the past several weeks.  Patient's condition has the potential to improve in response to therapy. Maximum improvement is yet to be obtained. The anticipated improvement is attainable and reasonable in a generally predictable time.   Pt will continue to benefit from skilled therapy to address remaining deficits in order to improve overall QoL and return to PLOF.     OBJECTIVE IMPAIRMENTS: decreased balance, decreased coordination, difficulty walking, decreased strength, and postural dysfunction.   ACTIVITY LIMITATIONS: carrying, lifting, bending, standing, and squatting  PARTICIPATION LIMITATIONS: community activity and yard work  PERSONAL FACTORS: 1-2 comorbidities: HTN; DM  are also affecting patient's functional outcome.   REHAB POTENTIAL: Good  CLINICAL DECISION MAKING:  Stable/uncomplicated  EVALUATION COMPLEXITY: Low  PLAN:  PT FREQUENCY: 1-2x/week  PT DURATION: 12 weeks  PLANNED INTERVENTIONS: Therapeutic exercises, Therapeutic activity, Neuromuscular re-education, Balance training, Gait training, Patient/Family education, Self Care, Joint mobilization, Joint manipulation, Stair training, Vestibular training, Canalith repositioning, DME instructions, Dry Needling, Electrical stimulation, Spinal manipulation, Spinal mobilization, Cryotherapy, Moist heat, Taping, Manual therapy, and Re-evaluation  PLAN FOR NEXT SESSION:   Dynamic and balance with emphasis on single limb control.  Core stabilization/strengthening.     Nolon Bussing, PT, DPT Physical Therapist - [facility]  12/17/22, 11:04 AM

## 2022-12-19 ENCOUNTER — Ambulatory Visit: Payer: Medicare Other | Admitting: Physical Therapy

## 2022-12-19 DIAGNOSIS — R278 Other lack of coordination: Secondary | ICD-10-CM

## 2022-12-19 DIAGNOSIS — R2681 Unsteadiness on feet: Secondary | ICD-10-CM

## 2022-12-19 DIAGNOSIS — M6281 Muscle weakness (generalized): Secondary | ICD-10-CM

## 2022-12-19 DIAGNOSIS — R262 Difficulty in walking, not elsewhere classified: Secondary | ICD-10-CM

## 2022-12-19 DIAGNOSIS — R269 Unspecified abnormalities of gait and mobility: Secondary | ICD-10-CM

## 2022-12-19 NOTE — Therapy (Signed)
OUTPATIENT PHYSICAL THERAPY NEURO TREATMENT  Patient Name: Calvin Roth MRN: [id number] DOB:07-08-1945, 77 y.o., male Today's Date: 12/19/2022   PCP: Dr. Einar Crow REFERRING PROVIDER: Dr. Einar Crow  END OF SESSION:  PT End of Session - 12/19/22 1618     Visit Number 11    Number of Visits 24    Date for PT Re-Evaluation 01/21/23    Progress Note Due on Visit 10    PT Start Time 1618    PT Stop Time 1700    PT Time Calculation (min) 42 min    Equipment Utilized During Treatment Gait belt    Activity Tolerance Patient tolerated treatment well    Behavior During Therapy [facility] for tasks assessed/performed               Past Medical History:  Diagnosis Date   Actinic keratosis    Aortic atherosclerosis (HCC)    Aortic root dilatation (HCC) 03/12/2019   a.) CTA 03/22/2019 --> 4.5 cm   Ascending aortic aneurysm (HCC) 10/31/2017   a.) fusiform; measured 4.4 cm   BPH (benign prostatic hyperplasia)    CKD (chronic kidney disease), stage III (HCC)    Coronary artery disease    DDD (degenerative disc disease), lumbar    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 Moderate to marked atypia, edge involved. Excised 09/12/2006, margins free.   Dysplastic nevus 11/19/2007   Left costal margin/infrapectoral. Slight to moderate atypia, margin focally involved.   Dysplastic nevus 11/19/2007   Right lateral flank near waistline. Moderate atypia, extends to one edge.    Dysplastic nevus 05/25/2019   Left lateral tricep. Moderate atypia, deep margin involved.    GERD (gastroesophageal reflux disease)    Hepatic steatosis    Hyperlipidemia    Hypertension    Intermittent alternating exotropia    Neurogenic bladder    a.) self catheriterizes FOUR times daily   Osteoarthritis    Serrated adenoma of colon 07/17/2016   Sliding hiatal hernia    T2DM (type 2 diabetes mellitus) (HCC)    Past Surgical History:   Procedure Laterality Date   CATARACT EXTRACTION Left 05/15/2018   CATARACT EXTRACTION Right 04/03/2018   CHOLECYSTECTOMY     COLONOSCOPY WITH PROPOFOL N/A 07/17/2016   Procedure: COLONOSCOPY WITH PROPOFOL;  Surgeon: Christena Deem, MD;  Location: [facility] ENDOSCOPY;  Service: Endoscopy;  Laterality: N/A;   COLONOSCOPY WITH PROPOFOL N/A 02/04/2018   Procedure: COLONOSCOPY WITH PROPOFOL;  Surgeon: Christena Deem, MD;  Location: [facility] ENDOSCOPY;  Service: Endoscopy;  Laterality: N/A;   EYE SURGERY Left 2000   Strabismus correction   FOREIGN BODY REMOVAL Left 10/23/2019   Procedure: REMOVAL FOREIGN BODY LEFT HEEL;  Surgeon: Gwyneth Revels, DPM;  Location: [facility] ORS;  Service: Podiatry;  Laterality: Left;   GASTRIC RESTRICTION SURGERY N/A 1982   "stomach stapled for weight loss"   LUMBAR FUSION N/A 02/10/2020   Procedure: PLIF,IP,PSI L2- L3;EXPL FUSION; Location: [facility]; Surgeon: Tressie Stalker, MD   POSTERIOR LUMBAR FUSION N/A 05/04/2015   Procedure: LUMBAR THREE-FOUR, LUMBAR FOUR-FIVE POSTERIOR LUMBAR FUSION; Location: [facility]; Surgeon: Tressie Stalker, MD   TONSILLECTOMY Bilateral 1954   TRANSANAL EXCISION OF RECTAL MASS N/A 09/18/2016   Procedure: TRANSANAL EXCISION OF RECTAL TUMOR;  Surgeon: Kieth Brightly, MD;  Location: [facility] ORS;  Service: General;  Laterality: N/A;   ULNAR COLLATERAL LIGAMENT REPAIR Left 12/28/2020   Procedure: LEFT THUMB  ULNAR COLLATERAL LIGAMENT REPAIR/RECONSTRUCTION;  Surgeon: Christena Flake, MD;  Location: [facility] ORS;  Service: Orthopedics;  Laterality: Left;   Patient Active Problem List   Diagnosis Date Noted   Spondylolisthesis of lumbar region 05/04/2015   Degeneration of intervertebral disc of lumbar region 10/19/2014   Neuritis or radiculitis due to rupture of lumbar intervertebral disc 10/19/2014   Lumbar canal stenosis 10/19/2014   Morbid obesity (HCC) 01/23/2014   Benign prostatic hyperplasia with urinary obstruction  10/01/2013   Type 2 diabetes mellitus (HCC) 10/01/2013   Benign hypertension 10/01/2013   Spinal stenosis 10/01/2013    ONSET DATE: around July 2023  REFERRING DIAG: R27.0 (ICD-10-CM) - Ataxia, unspecified   THERAPY DIAG:  Unsteadiness on feet  Abnormality of gait and mobility  Difficulty in walking, not elsewhere classified  Muscle weakness (generalized)  Other lack of coordination  Rationale for Evaluation and Treatment: Rehabilitation  SUBJECTIVE:                                                                                                                                                                                             SUBJECTIVE STATEMENT:  Pt reports he is doing well no falls or LOB reported since last session.    Pt accompanied by: self  PERTINENT HISTORY: Past Medical History:  Diagnosis Date  Diabetes mellitus type 2, uncomplicated (CMS/HHS-HCC)  Hyperlipidemia  Hypertension  Serrated adenoma of colon 07/17/2016   Past Surgical History:  Procedure Laterality Date  COLONOSCOPY 07/17/2016  Serrated adenoma/Repeat 83yr/MUS  COLONOSCOPY 02/04/2018  Tubular adenoma of the colon/Hyperplastic colon polyp/Repeat 30yrs/MUS  CATARACT EXTRACTION W/ INTRAOCULAR LENS IMPLANT Right 04/2018  LENS EYE SURGERY Right 04/03/2018  standard monofocal  CATARACT EXTRACTION Left 05/2018  LENS EYE SURGERY Left 05/15/2018  back surgery 02/10/2020  fused L2 and L3  Primary repair of ulnar collateral ligament with internal brace augmentation, left thumb Left 12/28/2020  Dr.Poggi  COLONOSCOPY 01/25/2022  PHxCP/normal colon/no repeat/CTL  CHOLECYSTECTOMY  COLONOSCOPY 04/15/2004, 07/13/2009  Correction of strabismus, left eye  EXPLORATION OF SPINAL FUSION  spinal fusion 05/2015  Gastric stapling  TONSILLECTOMY    PAIN:  Are you having pain? No  PRECAUTIONS: Fall  RED FLAGS: None   WEIGHT BEARING RESTRICTIONS: No  FALLS: Has patient fallen in last 6  months? No  LIVING ENVIRONMENT: Lives with: lives with their spouse Lives in: House/apartment Stairs: Yes: Internal: 10-14 steps; on right going up and External: 5 steps; on left going up Has following equipment at home: None  PLOF: Independent  PATIENT GOALS: I would like to be more stable and stand on 1 foot and have better  balance. I want to learn ways to regain balance  OBJECTIVE:   DIAGNOSTIC FINDINGS: none recent  COGNITION: Overall cognitive status: Within functional limits for tasks assessed   SENSATION: WFL- in all extremities  COORDINATION: Intact with heel to shin BLE  EDEMA:  None observed  LOWER EXTREMITY MMT:    MMT Right Eval Left Eval  Hip flexion 4 4  Hip extension 4 4  Hip abduction 4 4  Hip adduction 4 4  Hip internal rotation 4 4  Hip external rotation 4 4  Knee flexion 4 4  Knee extension 4 4  Ankle dorsiflexion 4 4  Ankle plantarflexion    Ankle inversion 4 4  Ankle eversion 4 4  (Blank rows = not tested)   TRANSFERS: Assistive device utilized: None  Sit to stand: Complete Independence Stand to sit: Complete Independence Chair to chair: Complete Independence Floor:  not tested   GAIT: Gait pattern: decreased arm swing- Right, decreased arm swing- Left, decreased step length- Right, and decreased step length- Left Distance walked: 100+ Assistive device utilized: None Level of assistance: Complete Independence  FUNCTIONAL TESTS:  5 times sit to stand: 16.01 sec without UE support Timed up and go (TUG): 10.91 sec without UE support 10 meter walk test: 1.0 m/s Berg Balance Scale: 50/56  PATIENT SURVEYS:  FOTO 58 with goal of 59  TODAY'S TREATMENT: DATE: 12/19/22   5# ankle weights donned.  Step up/down inch step x 12 bil  Obstacle navigation:  Up/down aerobic step with airex pad, across airex step, across airex beam, up down 6inch step, stepping with forefoot on 8 spike balls. Performed 2 x 2 laps with therapeutic break  break between bouts.  Lateral step ups  x 12 bil  Doffed  ankle weights.  Standing on bosu ball 1 min x 4   Gait belt and CGA provided for dynamic balance training tasks and moderate cues for use of ankle strategy to correct LOB.    PATIENT EDUCATION: Education details: Pt educated throughout session about proper posture and technique with exercises. Improved exercise technique, movement at target joints, use of target muscles after min to mod verbal, visual, tactile cues.  Person educated: Patient Education method: Explanation Education comprehension: verbalized understanding  HOME EXERCISE PROGRAM: Access Code: [email or web address] URL: [email or web address] Date: 11/22/2022 Prepared by: Grier Rocher  Exercises - Standing Forward Step Taps with Counter Support  - 1 x daily - 7 x weekly - 3 sets - 12 reps - Romberg Stance with Eyes Closed  - 1 x daily - 7 x weekly - 3 sets - 3 reps - 20 hold - Romberg Stance with Head Nods  - 1 x daily - 7 x weekly - 3 sets - 10 reps - Romberg Stance with Head Rotation  - 1 x daily - 7 x weekly - 3 sets - 10 reps - Seated Hip Abduction with Resistance  - 1 x daily - 7 x weekly - 3 sets - 10 reps - 3 hold - Sitting Knee Extension with Resistance  - 1 x daily - 7 x weekly - 3 sets - 10 reps - 3 hold - Seated March with Resistance  - 1 x daily - 7 x weekly - 3 sets - 10 reps - 2 hold - Standing Tandem Balance with Counter Support  - 1 x daily - 7 x weekly - 4 sets - 4 reps - 20 hold  Access Code: [email or web address] URL: [email or web address] Date: 10/29/2022 Prepared by: Maureen Ralphs  Exercises -  Single Leg Stance  - 3 x weekly - 5-10 sets - as long as possible-  hold - Tandem Stance  - 3 x weekly - 3 sets - as long as possible hold   GOALS: Goals reviewed with patient? Yes  SHORT TERM GOALS: Target date: 12/10/2022  Patient will be independent with HEP in order to improve strength and balance in order to decrease fall risk and  improve function at home and work.  Baseline: EVAL- No formal HEP in place Goal status: INITIAL  LONG TERM GOALS: Target date: 01/21/2023  1.  Patient (> 19 years old) will complete five times sit to stand test in < 15 seconds indicating an increased LE strength and improved balance. Baseline: 16.01 at eval 12/17/22: 9.88 sec Goal status: MET  2.  Patient will increase FOTO score to equal to or greater than  59   to demonstrate statistically significant improvement in mobility and quality of life.  Baseline: EVAL= 58 12/17/22: 63 Goal status: MET   3.  Patient will increase Berg Balance score by > 3 points to demonstrate decreased fall risk during functional activities. Baseline: EVAL=50/56 12/17/22: 52/56 Goal status: PROGRESSING  4.  Patient will demonstrate improved dynamic standing balance as seen by single leg standing > 10 sec each LE Baseline: EVAL=2-3 sec each LE 12/17/22: 3 sec on L LE, 2 sec on R LE Goal status: INITIAL  ASSESSMENT:  CLINICAL IMPRESSION:  Pt is making significant progress towards goals. PT treatment focused on functional gait training with increased demand on BLE in dynamic environment to navigate obstacles as well as static balance on unlevel surface. Min assist to prevent posterior LOB. Pt will continue to benefit from skilled therapy to address remaining deficits in order to improve overall QoL and return to PLOF.     OBJECTIVE IMPAIRMENTS: decreased balance, decreased coordination, difficulty walking, decreased strength, and postural dysfunction.   ACTIVITY LIMITATIONS: carrying, lifting, bending, standing, and squatting  PARTICIPATION LIMITATIONS: community activity and yard work  PERSONAL FACTORS: 1-2 comorbidities: HTN; DM  are also affecting patient's functional outcome.   REHAB POTENTIAL: Good  CLINICAL DECISION MAKING: Stable/uncomplicated  EVALUATION COMPLEXITY: Low  PLAN:  PT FREQUENCY: 1-2x/week  PT DURATION: 12 weeks  PLANNED  INTERVENTIONS: Therapeutic exercises, Therapeutic activity, Neuromuscular re-education, Balance training, Gait training, Patient/Family education, Self Care, Joint mobilization, Joint manipulation, Stair training, Vestibular training, Canalith repositioning, DME instructions, Dry Needling, Electrical stimulation, Spinal manipulation, Spinal mobilization, Cryotherapy, Moist heat, Taping, Manual therapy, and Re-evaluation  PLAN FOR NEXT SESSION:   Dynamic and balance with emphasis on single limb control.  Core stabilization/strengthening.     Grier Rocher PT, DPT  Physical Therapist - [place]  [facility]  5:21 PM 12/19/22

## 2022-12-20 ENCOUNTER — Ambulatory Visit: Payer: Medicare Other | Admitting: Physical Therapy

## 2022-12-26 ENCOUNTER — Ambulatory Visit: Payer: Medicare Other | Admitting: Physical Therapy

## 2022-12-28 ENCOUNTER — Ambulatory Visit: Payer: Medicare Other | Admitting: Physical Therapy

## 2022-12-28 DIAGNOSIS — R262 Difficulty in walking, not elsewhere classified: Secondary | ICD-10-CM

## 2022-12-28 DIAGNOSIS — R2681 Unsteadiness on feet: Secondary | ICD-10-CM

## 2022-12-28 DIAGNOSIS — R278 Other lack of coordination: Secondary | ICD-10-CM

## 2022-12-28 DIAGNOSIS — R269 Unspecified abnormalities of gait and mobility: Secondary | ICD-10-CM

## 2022-12-28 DIAGNOSIS — M6281 Muscle weakness (generalized): Secondary | ICD-10-CM

## 2022-12-28 IMAGING — CT CT ANGIO CHEST
2 of 7 series · 14 of 36 positions shown · non-contrast
Comparison: CTA chest 03/12/2019; 05/17/2020

CLINICAL DATA: Thoracic aortic aneurysm, follow-up

EXAM:
CT ANGIOGRAPHY CHEST, ABDOMEN AND PELVIS
TECHNIQUE: Non-contrast CT of the chest was initially obtained.

[Series 4: axial arterial arterial 2.00 ax · axial · arterial · 0.82mm/px · z∈[-1538,-924]mm · 13 of 349 slices shown]
[im 21/349  lung]
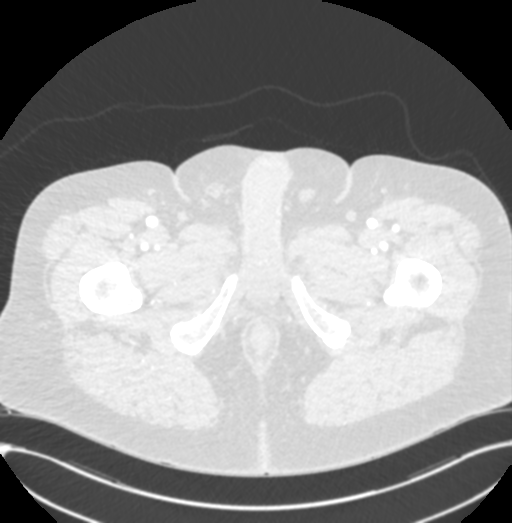
[im 41/349  mediastinal]
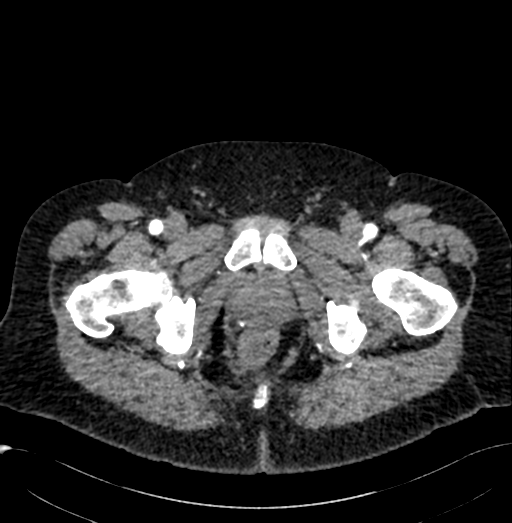
[im 82/349  lung]
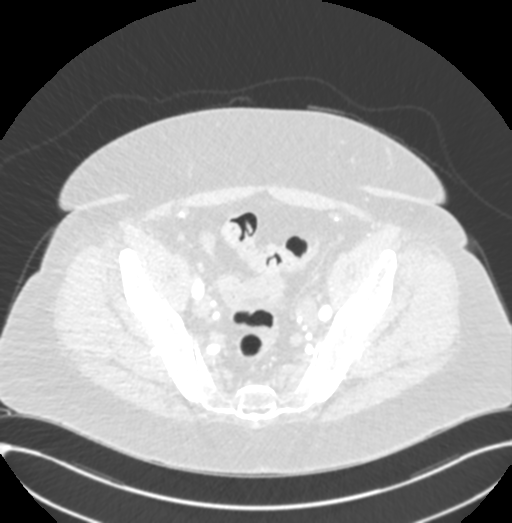
[im 103/349  mediastinal]
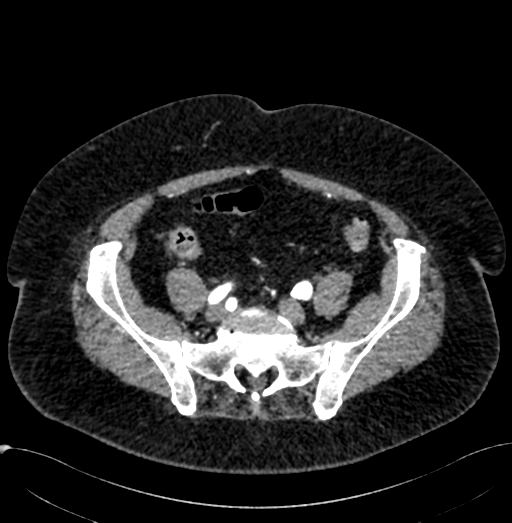
[im 123/349  lung]
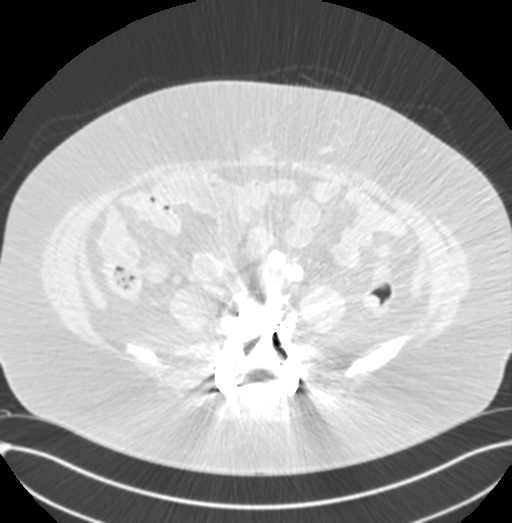
[im 144/349  mediastinal]
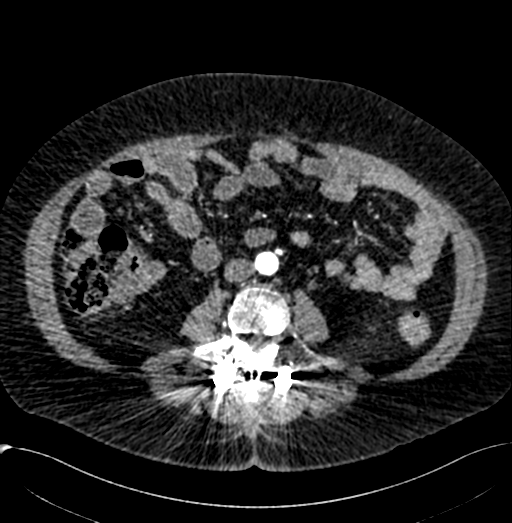
[im 185/349  lung]
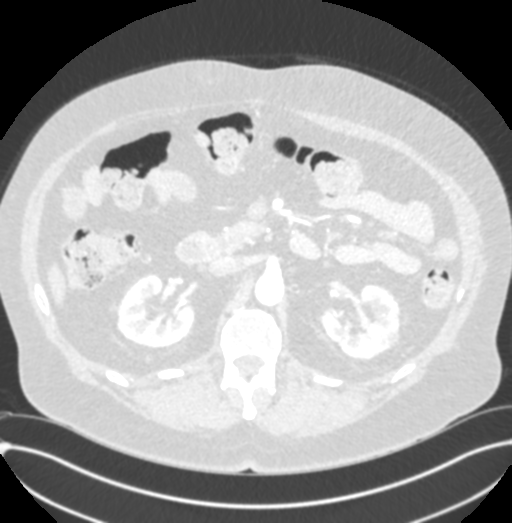
[im 205/349  mediastinal]
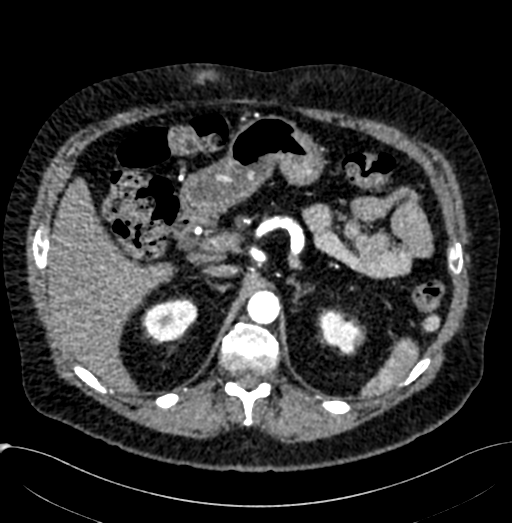
[im 226/349  lung]
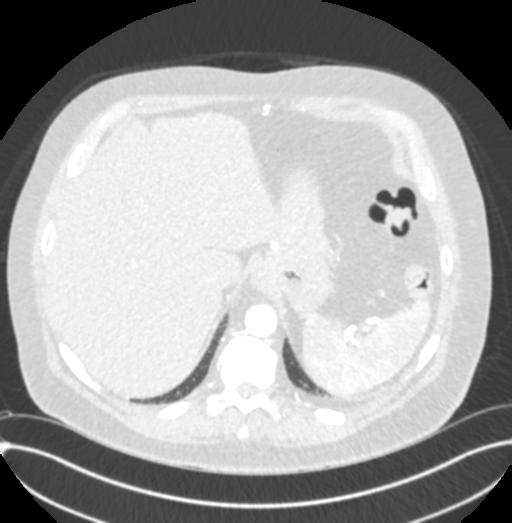
[im 246/349  mediastinal]
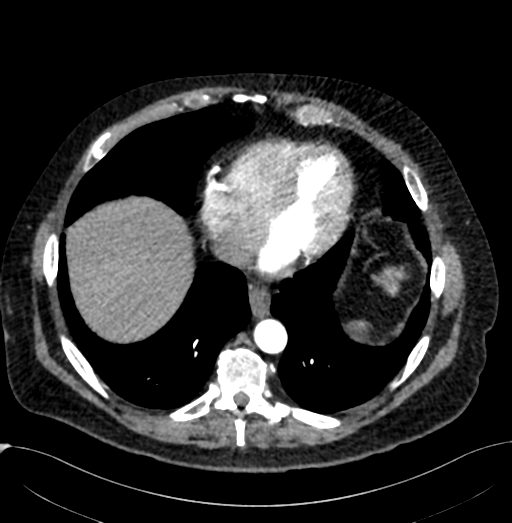
[im 267/349  lung]
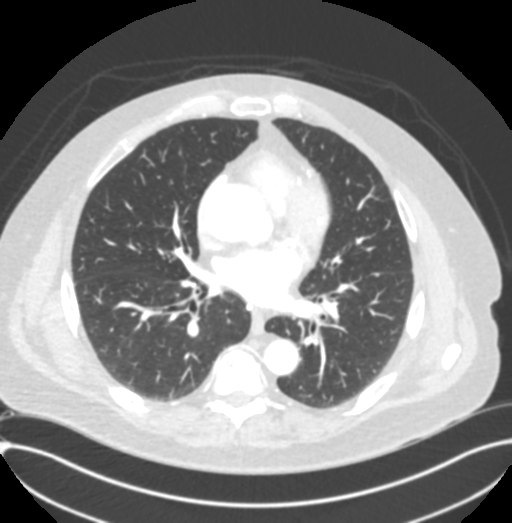
[im 308/349  mediastinal]
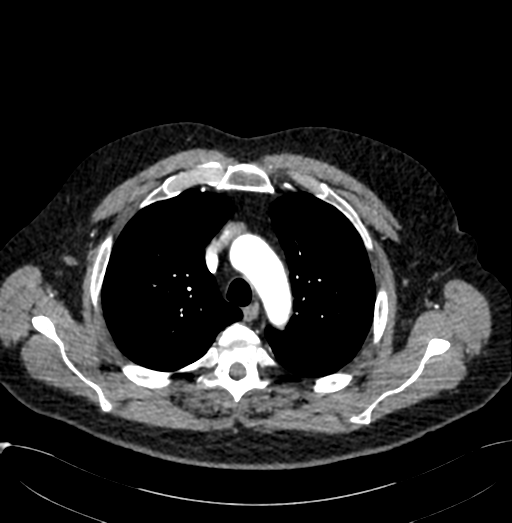
[im 328/349  lung]
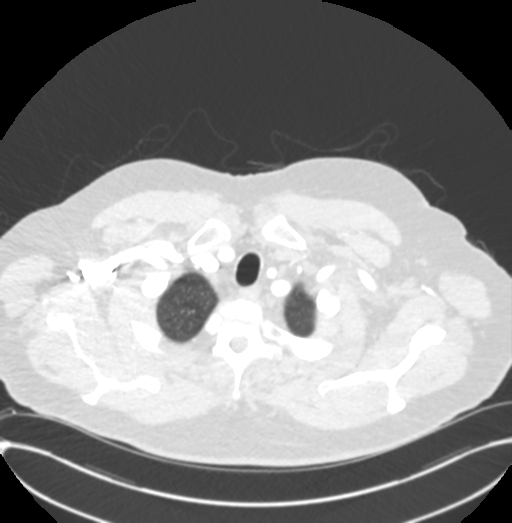

[Series 7: cor st arterial 2.00 cor · coronal · arterial · 0.84mm/px · 1 of 170 slices shown]
[im 85/170  mediastinal]
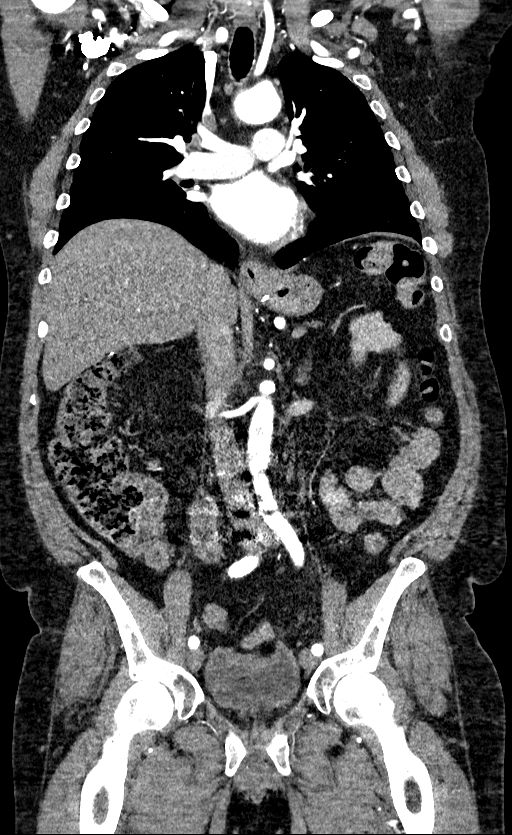

[14 of 36 positions shown; findings below may reference images not displayed]

Multidetector CT imaging through the chest, abdomen and pelvis was
performed using the standard protocol during bolus administration of
intravenous contrast. Multiplanar reconstructed images and MIPs were
obtained and reviewed to evaluate the vascular anatomy.

RADIATION DOSE REDUCTION: This exam was performed according to the
departmental dose-optimization program which includes automated
exposure control, adjustment of the mA and/or kV according to
patient size and/or use of iterative reconstruction technique.

CONTRAST:  85mL OMNIPAQUE IOHEXOL 350 MG/ML SOLN
FINDINGS: CTA CHEST FINDINGS

Cardiovascular: 2 vessel arch anatomy. The right brachiocephalic and
left common carotid artery share a common origin. The aortic root is
dilated at 4.7 cm measured at the sinuses of Valsalva. No effacement
of the Cisco junction which measures 3.6 cm. Aneurysmal
dilation of the tubular portion of the ascending thoracic aorta with
a maximal diameter of 4.5 cm. Minimal atherosclerotic plaque. The
main pulmonary artery is normal in size. The heart is normal in
size. Atherosclerotic calcifications visualized throughout the
coronary arteries. No pericardial effusion.

Mediastinum/Nodes: Unremarkable appearance of the thyroid gland. No
suspicious mediastinal mass or adenopathy. Multiple small calcified
lymph nodes present in the right hilar and paraesophageal nodal
stations. Small hiatal hernia.

Lungs/Pleura: Numerous tiny 1-2 mm centrilobular pulmonary nodules
in the periphery of the lungs. 0.6 cm subpleural pulmonary nodule
affiliated with the minor fissure remains unchanged dating back to
Sunday March, 2019. Greater than 2 year stability consistent with
benignity. Stable dystrophic calcification in the right lower lobe.
Additional small scattered calcified and noncalcified subpleural
nodules also remain unchanged. There is a new abnormality within the
central aspect of the right lower lobe which was not present on the
prior imaging. The abnormality is a ground-glass attenuation focus
measuring approximately 2.7 x 1.9 cm in greatest axial dimensions no
definite solid component.

Musculoskeletal: No acute fracture or aggressive appearing lytic or
blastic osseous lesion.

Review of the MIP images confirms the above findings.

CTA ABDOMEN AND PELVIS FINDINGS

VASCULAR

Aorta: Normal caliber aorta without aneurysm, dissection, vasculitis
or significant stenosis. Trace atherosclerotic calcifications.

Celiac: Patent without evidence of aneurysm, dissection, vasculitis
or significant stenosis.

SMA: Patent without evidence of aneurysm, dissection, vasculitis or
significant stenosis.

Renals: Both renal arteries are patent without evidence of aneurysm,
dissection, vasculitis, fibromuscular dysplasia or significant
stenosis.

IMA: Patent without evidence of aneurysm, dissection, vasculitis or
significant stenosis.

Inflow: Patent without evidence of aneurysm, dissection, vasculitis
or significant stenosis.

Veins: No obvious venous abnormality within the limitations of this
arterial phase study.

Review of the MIP images confirms the above findings.

NON-VASCULAR

Hepatobiliary: No focal liver abnormality is seen. Status post
cholecystectomy. No biliary dilatation.

Pancreas: Marked pancreatic atrophy. Calcifications are present
throughout the atrophied gland.

Spleen: Normal in size without focal abnormality.

Adrenals/Urinary Tract: Normal adrenal glands. No hydronephrosis,
nephrolithiasis or enhancing renal mass. 1.9 cm intermediate
attenuation lesion in the interpolar left kidney. No definitive
enhancement on delayed phase images. The ureters and bladder are
unremarkable. Additional small low-attenuation lesions bilaterally
are too small to characterize.

Stomach/Bowel: Stomach is within normal limits. Appendix appears
normal. No evidence of bowel wall thickening, distention, or
inflammatory changes.

Lymphatic: No suspicious lymphadenopathy.

Reproductive: Prostate is unremarkable.

Other: No abdominal wall hernia or abnormality. No abdominopelvic
ascites.

Musculoskeletal: Surgical changes of prior L2-L5 posterior lumbar
interbody fusion with interbody grafts at each level. No acute
fracture or malalignment.

Review of the MIP images confirms the above findings.
IMPRESSION: CTA CHEST

1. Mild fusiform aneurysmal dilation of the ascending thoracic aorta
with a maximal diameter of 4.5 cm. Additionally, the aortic root is
dilated at 4.7 cm. Ascending thoracic aortic aneurysm. Recommend
semi-annual imaging followup by CTA or MRA and referral to
cardiothoracic surgery if not already obtained. This recommendation
follows 3898 ACCF/AHA/AATS/ACR/ASA/SCA/WAGNER/KOLDBY/KANIEL/STIFLER Guidelines
for the Diagnosis and Management of Patients With Thoracic Aortic
Disease. Circulation. 3898; 121: E266-e369.
2. Rounded patch of ground-glass attenuation airspace opacity in the
right lower lobe measures up to 2.7 cm in greatest diameter.
Differential considerations include an active focus of
infection/inflammation versus a low grade primary bronchogenic
neoplasm such as low-grade adenocarcinoma, or adenocarcinoma in
situ. Initial follow-up with CT at 6-12 months is recommended to
confirm persistence. If persistent, repeat CT is recommended every 2
years until 5 years of stability has been established. This
recommendation follows the consensus statement: Guidelines for
Management of Incidental Pulmonary Nodules Detected on CT Images:
3. Multi-vessel coronary artery atherosclerotic vascular
calcifications.
4. Multiple punctate centrilobular pulmonary nodules in the
periphery of the upper lungs bilaterally. Findings are most
consistent with smoking related disease such as respiratory
bronchiolitis interstitial lung disease (CRUSE).
5. Evidence of old granulomatous disease with stable subpleural
pulmonary nodules and calcified right hilar lymph nodes.

CTA ABD/PELVIS

1. No evidence of abdominal aortic aneurysm.
2. 1.9 cm intermediate density lesion partially exophytic from the
interpolar left kidney. Differential considerations include complex
cyst versus papillary renal cell carcinoma. Further evaluation with
pre and post contrast MRI or CT should be considered. MRI is
preferred in younger patients (due to lack of ionizing radiation)
and for evaluating calcified lesion(s).
3. Markedly atrophied and calcified pancreas suggests sequelae of
chronic pancreatitis.
4. Additional ancillary findings as above.

Aortic aneurysm NOS (OUMZK-N33.O); Aortic Atherosclerosis
(OUMZK-YZ8.8).

## 2022-12-28 IMAGING — CT CT CTA ABD/PEL W/CM AND/OR W/O CM
2 of 7 series · 11 of 46 positions shown, 12 images · non-contrast
Comparison: CTA chest 03/12/2019; 05/17/2020

CLINICAL DATA: Thoracic aortic aneurysm, follow-up

EXAM:
CT ANGIOGRAPHY CHEST, ABDOMEN AND PELVIS
TECHNIQUE: Non-contrast CT of the chest was initially obtained.

[Series 6: arterial thins arterial 1.00 · axial · arterial · 0.84mm/px · z∈[-1524,-945]mm · 8 of 1169 slices shown, 9 images]
[im 102/1169  soft-tissue]
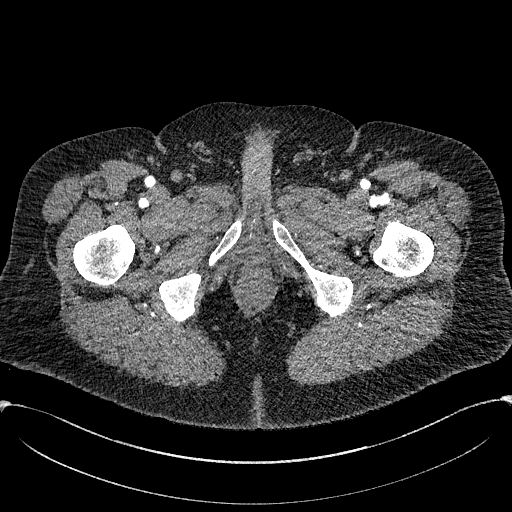
[im 102/1169  bone]
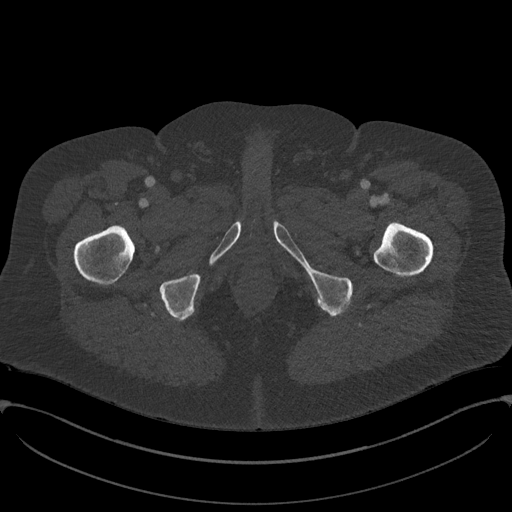
[im 254/1169  soft-tissue]
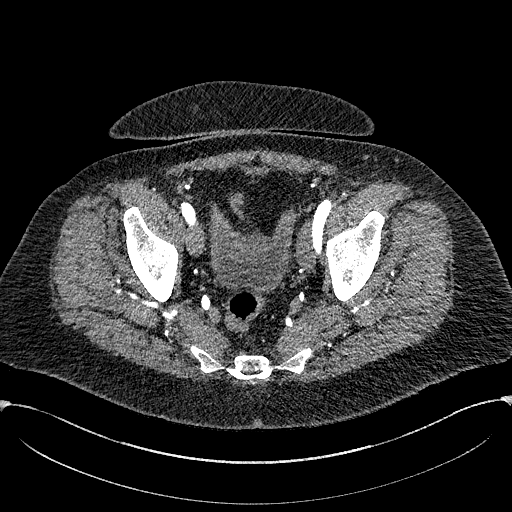
[im 356/1169  soft-tissue]
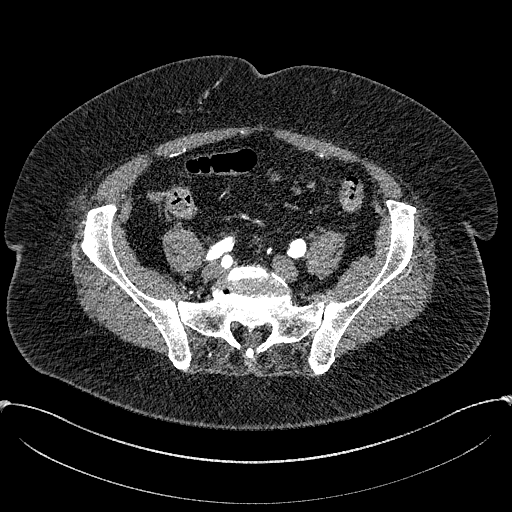
[im 508/1169  soft-tissue]
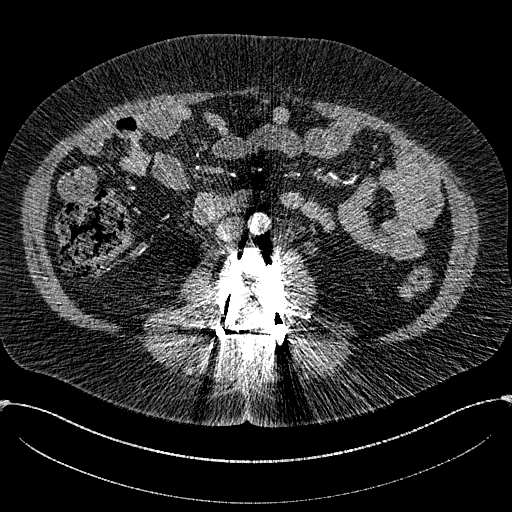
[im 661/1169  soft-tissue]
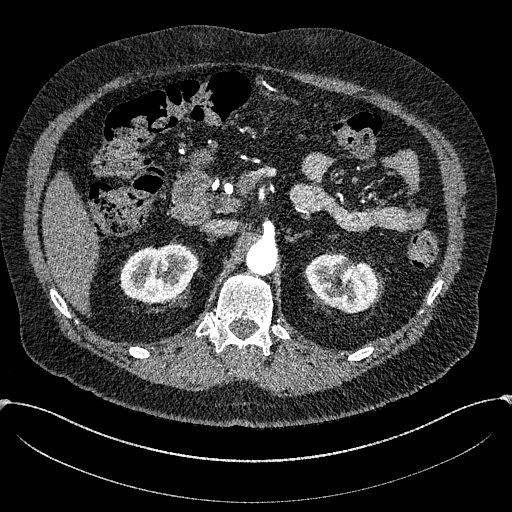
[im 813/1169  soft-tissue]
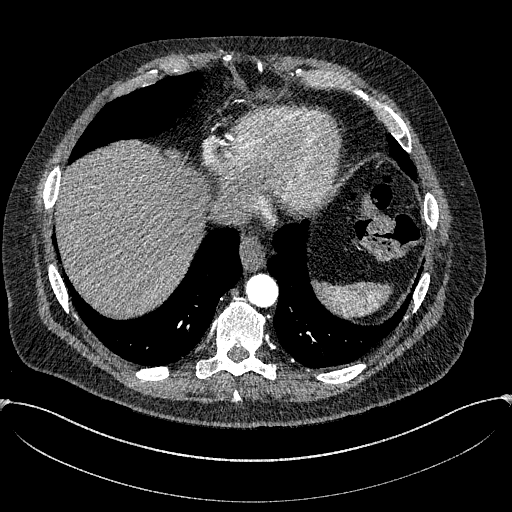
[im 915/1169  soft-tissue]
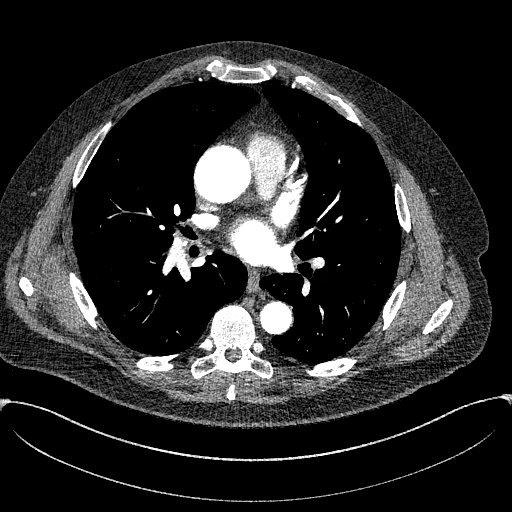
[im 1067/1169  soft-tissue]
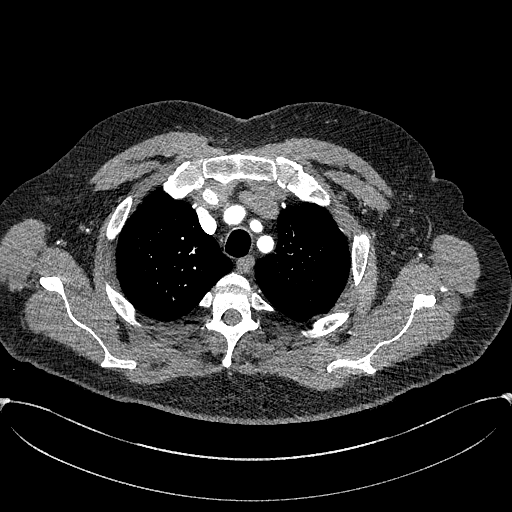

[Series 7: cor st arterial 2.00 cor · coronal · arterial · 0.84mm/px · 3 of 170 slices shown]
[im 43/170  soft-tissue]
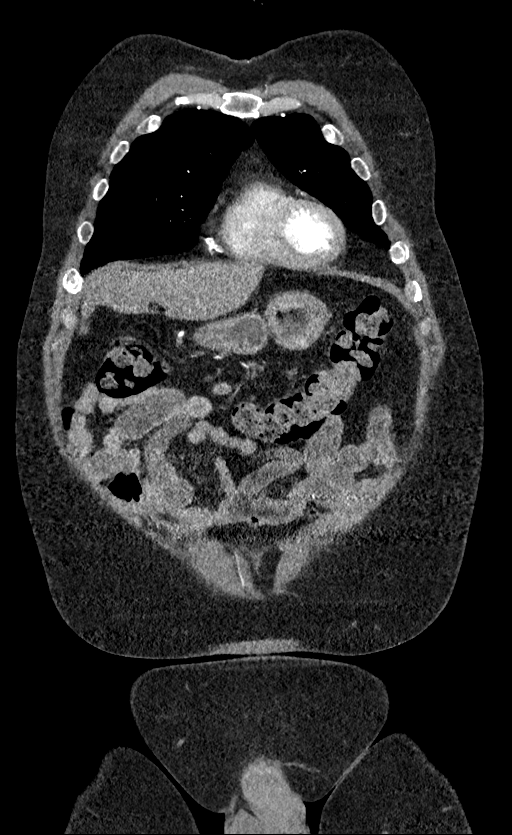
[im 85/170  soft-tissue]
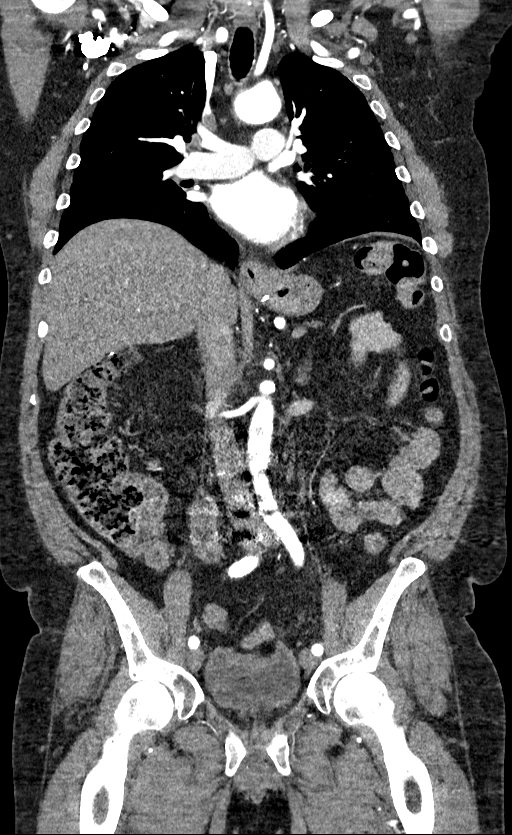
[im 127/170  soft-tissue]
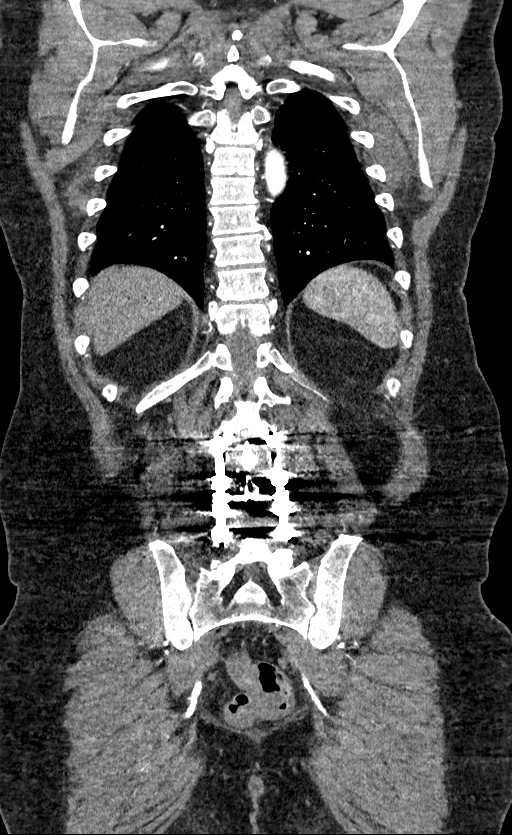

[11 of 46 positions shown; findings below may reference images not displayed]

Multidetector CT imaging through the chest, abdomen and pelvis was
performed using the standard protocol during bolus administration of
intravenous contrast. Multiplanar reconstructed images and MIPs were
obtained and reviewed to evaluate the vascular anatomy.

RADIATION DOSE REDUCTION: This exam was performed according to the
departmental dose-optimization program which includes automated
exposure control, adjustment of the mA and/or kV according to
patient size and/or use of iterative reconstruction technique.

CONTRAST:  85mL OMNIPAQUE IOHEXOL 350 MG/ML SOLN
FINDINGS: CTA CHEST FINDINGS

Cardiovascular: 2 vessel arch anatomy. The right brachiocephalic and
left common carotid artery share a common origin. The aortic root is
dilated at 4.7 cm measured at the sinuses of Valsalva. No effacement
of the Cisco junction which measures 3.6 cm. Aneurysmal
dilation of the tubular portion of the ascending thoracic aorta with
a maximal diameter of 4.5 cm. Minimal atherosclerotic plaque. The
main pulmonary artery is normal in size. The heart is normal in
size. Atherosclerotic calcifications visualized throughout the
coronary arteries. No pericardial effusion.

Mediastinum/Nodes: Unremarkable appearance of the thyroid gland. No
suspicious mediastinal mass or adenopathy. Multiple small calcified
lymph nodes present in the right hilar and paraesophageal nodal
stations. Small hiatal hernia.

Lungs/Pleura: Numerous tiny 1-2 mm centrilobular pulmonary nodules
in the periphery of the lungs. 0.6 cm subpleural pulmonary nodule
affiliated with the minor fissure remains unchanged dating back to
Sunday March, 2019. Greater than 2 year stability consistent with
benignity. Stable dystrophic calcification in the right lower lobe.
Additional small scattered calcified and noncalcified subpleural
nodules also remain unchanged. There is a new abnormality within the
central aspect of the right lower lobe which was not present on the
prior imaging. The abnormality is a ground-glass attenuation focus
measuring approximately 2.7 x 1.9 cm in greatest axial dimensions no
definite solid component.

Musculoskeletal: No acute fracture or aggressive appearing lytic or
blastic osseous lesion.

Review of the MIP images confirms the above findings.

CTA ABDOMEN AND PELVIS FINDINGS

VASCULAR

Aorta: Normal caliber aorta without aneurysm, dissection, vasculitis
or significant stenosis. Trace atherosclerotic calcifications.

Celiac: Patent without evidence of aneurysm, dissection, vasculitis
or significant stenosis.

SMA: Patent without evidence of aneurysm, dissection, vasculitis or
significant stenosis.

Renals: Both renal arteries are patent without evidence of aneurysm,
dissection, vasculitis, fibromuscular dysplasia or significant
stenosis.

IMA: Patent without evidence of aneurysm, dissection, vasculitis or
significant stenosis.

Inflow: Patent without evidence of aneurysm, dissection, vasculitis
or significant stenosis.

Veins: No obvious venous abnormality within the limitations of this
arterial phase study.

Review of the MIP images confirms the above findings.

NON-VASCULAR

Hepatobiliary: No focal liver abnormality is seen. Status post
cholecystectomy. No biliary dilatation.

Pancreas: Marked pancreatic atrophy. Calcifications are present
throughout the atrophied gland.

Spleen: Normal in size without focal abnormality.

Adrenals/Urinary Tract: Normal adrenal glands. No hydronephrosis,
nephrolithiasis or enhancing renal mass. 1.9 cm intermediate
attenuation lesion in the interpolar left kidney. No definitive
enhancement on delayed phase images. The ureters and bladder are
unremarkable. Additional small low-attenuation lesions bilaterally
are too small to characterize.

Stomach/Bowel: Stomach is within normal limits. Appendix appears
normal. No evidence of bowel wall thickening, distention, or
inflammatory changes.

Lymphatic: No suspicious lymphadenopathy.

Reproductive: Prostate is unremarkable.

Other: No abdominal wall hernia or abnormality. No abdominopelvic
ascites.

Musculoskeletal: Surgical changes of prior L2-L5 posterior lumbar
interbody fusion with interbody grafts at each level. No acute
fracture or malalignment.

Review of the MIP images confirms the above findings.
IMPRESSION: CTA CHEST

1. Mild fusiform aneurysmal dilation of the ascending thoracic aorta
with a maximal diameter of 4.5 cm. Additionally, the aortic root is
dilated at 4.7 cm. Ascending thoracic aortic aneurysm. Recommend
semi-annual imaging followup by CTA or MRA and referral to
cardiothoracic surgery if not already obtained. This recommendation
follows 3898 ACCF/AHA/AATS/ACR/ASA/SCA/WAGNER/KOLDBY/KANIEL/STIFLER Guidelines
for the Diagnosis and Management of Patients With Thoracic Aortic
Disease. Circulation. 3898; 121: E266-e369.
2. Rounded patch of ground-glass attenuation airspace opacity in the
right lower lobe measures up to 2.7 cm in greatest diameter.
Differential considerations include an active focus of
infection/inflammation versus a low grade primary bronchogenic
neoplasm such as low-grade adenocarcinoma, or adenocarcinoma in
situ. Initial follow-up with CT at 6-12 months is recommended to
confirm persistence. If persistent, repeat CT is recommended every 2
years until 5 years of stability has been established. This
recommendation follows the consensus statement: Guidelines for
Management of Incidental Pulmonary Nodules Detected on CT Images:
3. Multi-vessel coronary artery atherosclerotic vascular
calcifications.
4. Multiple punctate centrilobular pulmonary nodules in the
periphery of the upper lungs bilaterally. Findings are most
consistent with smoking related disease such as respiratory
bronchiolitis interstitial lung disease (CRUSE).
5. Evidence of old granulomatous disease with stable subpleural
pulmonary nodules and calcified right hilar lymph nodes.

CTA ABD/PELVIS

1. No evidence of abdominal aortic aneurysm.
2. 1.9 cm intermediate density lesion partially exophytic from the
interpolar left kidney. Differential considerations include complex
cyst versus papillary renal cell carcinoma. Further evaluation with
pre and post contrast MRI or CT should be considered. MRI is
preferred in younger patients (due to lack of ionizing radiation)
and for evaluating calcified lesion(s).
3. Markedly atrophied and calcified pancreas suggests sequelae of
chronic pancreatitis.
4. Additional ancillary findings as above.

Aortic aneurysm NOS (OUMZK-N33.O); Aortic Atherosclerosis
(OUMZK-YZ8.8).

## 2022-12-28 NOTE — Therapy (Signed)
OUTPATIENT PHYSICAL THERAPY NEURO TREATMENT  Patient Name: Calvin Roth MRN: [id number] DOB:1946-03-24, 77 y.o., male Today's Date: 12/28/2022   PCP: Dr. Einar Crow REFERRING PROVIDER: Dr. Einar Crow  END OF SESSION:  PT End of Session - 12/28/22 0852     Visit Number 12    Number of Visits 24    Date for PT Re-Evaluation 01/21/23    Progress Note Due on Visit 10    PT Start Time 0847    PT Stop Time 0930    PT Time Calculation (min) 43 min    Equipment Utilized During Treatment Gait belt    Activity Tolerance Patient tolerated treatment well    Behavior During Therapy [facility] for tasks assessed/performed               Past Medical History:  Diagnosis Date   Actinic keratosis    Aortic atherosclerosis (HCC)    Aortic root dilatation (HCC) 03/12/2019   a.) CTA 03/22/2019 --> 4.5 cm   Ascending aortic aneurysm (HCC) 10/31/2017   a.) fusiform; measured 4.4 cm   BPH (benign prostatic hyperplasia)    CKD (chronic kidney disease), stage III (HCC)    Coronary artery disease    DDD (degenerative disc disease), lumbar    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 Moderate to marked atypia, edge involved. Excised 09/12/2006, margins free.   Dysplastic nevus 11/19/2007   Left costal margin/infrapectoral. Slight to moderate atypia, margin focally involved.   Dysplastic nevus 11/19/2007   Right lateral flank near waistline. Moderate atypia, extends to one edge.    Dysplastic nevus 05/25/2019   Left lateral tricep. Moderate atypia, deep margin involved.    GERD (gastroesophageal reflux disease)    Hepatic steatosis    Hyperlipidemia    Hypertension    Intermittent alternating exotropia    Neurogenic bladder    a.) self catheriterizes FOUR times daily   Osteoarthritis    Serrated adenoma of colon 07/17/2016   Sliding hiatal hernia    T2DM (type 2 diabetes mellitus) (HCC)    Past Surgical History:   Procedure Laterality Date   CATARACT EXTRACTION Left 05/15/2018   CATARACT EXTRACTION Right 04/03/2018   CHOLECYSTECTOMY     COLONOSCOPY WITH PROPOFOL N/A 07/17/2016   Procedure: COLONOSCOPY WITH PROPOFOL;  Surgeon: Christena Deem, MD;  Location: [facility] ENDOSCOPY;  Service: Endoscopy;  Laterality: N/A;   COLONOSCOPY WITH PROPOFOL N/A 02/04/2018   Procedure: COLONOSCOPY WITH PROPOFOL;  Surgeon: Christena Deem, MD;  Location: [facility] ENDOSCOPY;  Service: Endoscopy;  Laterality: N/A;   EYE SURGERY Left 2000   Strabismus correction   FOREIGN BODY REMOVAL Left 10/23/2019   Procedure: REMOVAL FOREIGN BODY LEFT HEEL;  Surgeon: Gwyneth Revels, DPM;  Location: [facility] ORS;  Service: Podiatry;  Laterality: Left;   GASTRIC RESTRICTION SURGERY N/A 1982   "stomach stapled for weight loss"   LUMBAR FUSION N/A 02/10/2020   Procedure: PLIF,IP,PSI L2- L3;EXPL FUSION; Location: [facility]; Surgeon: Tressie Stalker, MD   POSTERIOR LUMBAR FUSION N/A 05/04/2015   Procedure: LUMBAR THREE-FOUR, LUMBAR FOUR-FIVE POSTERIOR LUMBAR FUSION; Location: [facility]; Surgeon: Tressie Stalker, MD   TONSILLECTOMY Bilateral 1954   TRANSANAL EXCISION OF RECTAL MASS N/A 09/18/2016   Procedure: TRANSANAL EXCISION OF RECTAL TUMOR;  Surgeon: Kieth Brightly, MD;  Location: [facility] ORS;  Service: General;  Laterality: N/A;   ULNAR COLLATERAL LIGAMENT REPAIR Left 12/28/2020   Procedure: LEFT THUMB  ULNAR COLLATERAL LIGAMENT REPAIR/RECONSTRUCTION;  Surgeon: Christena Flake, MD;  Location: [facility] ORS;  Service: Orthopedics;  Laterality: Left;   Patient Active Problem List   Diagnosis Date Noted   Spondylolisthesis of lumbar region 05/04/2015   Degeneration of intervertebral disc of lumbar region 10/19/2014   Neuritis or radiculitis due to rupture of lumbar intervertebral disc 10/19/2014   Lumbar canal stenosis 10/19/2014   Morbid obesity (HCC) 01/23/2014   Benign prostatic hyperplasia with urinary obstruction  10/01/2013   Type 2 diabetes mellitus (HCC) 10/01/2013   Benign hypertension 10/01/2013   Spinal stenosis 10/01/2013    ONSET DATE: around July 2023  REFERRING DIAG: R27.0 (ICD-10-CM) - Ataxia, unspecified   THERAPY DIAG:  Unsteadiness on feet  Abnormality of gait and mobility  Difficulty in walking, not elsewhere classified  Muscle weakness (generalized)  Other lack of coordination  Rationale for Evaluation and Treatment: Rehabilitation  SUBJECTIVE:                                                                                                                                                                                             SUBJECTIVE STATEMENT:  Pt reports that he is doing well. Wants to go to his workshop and build a few of pieces of equipment that we have used in rehab, such as rocker board and wedge.   Pt accompanied by: self  PERTINENT HISTORY: Past Medical History:  Diagnosis Date  Diabetes mellitus type 2, uncomplicated (CMS/HHS-HCC)  Hyperlipidemia  Hypertension  Serrated adenoma of colon 07/17/2016   Past Surgical History:  Procedure Laterality Date  COLONOSCOPY 07/17/2016  Serrated adenoma/Repeat 37yr/MUS  COLONOSCOPY 02/04/2018  Tubular adenoma of the colon/Hyperplastic colon polyp/Repeat 77yrs/MUS  CATARACT EXTRACTION W/ INTRAOCULAR LENS IMPLANT Right 04/2018  LENS EYE SURGERY Right 04/03/2018  standard monofocal  CATARACT EXTRACTION Left 05/2018  LENS EYE SURGERY Left 05/15/2018  back surgery 02/10/2020  fused L2 and L3  Primary repair of ulnar collateral ligament with internal brace augmentation, left thumb Left 12/28/2020  Dr.Poggi  COLONOSCOPY 01/25/2022  PHxCP/normal colon/no repeat/CTL  CHOLECYSTECTOMY  COLONOSCOPY 04/15/2004, 07/13/2009  Correction of strabismus, left eye  EXPLORATION OF SPINAL FUSION  spinal fusion 05/2015  Gastric stapling  TONSILLECTOMY    PAIN:  Are you having pain? No  PRECAUTIONS: Fall  RED  FLAGS: None   WEIGHT BEARING RESTRICTIONS: No  FALLS: Has patient fallen in last 6 months? No  LIVING ENVIRONMENT: Lives with: lives with their spouse Lives in: House/apartment Stairs: Yes: Internal: 10-14 steps; on right going up and External: 5 steps; on left going up Has following equipment at home: None  PLOF: Independent  PATIENT GOALS: I would like to be more stable and stand on 1 foot and have better balance. I want to learn ways to regain balance  OBJECTIVE:   DIAGNOSTIC FINDINGS: none recent  COGNITION: Overall cognitive status: Within functional limits for tasks assessed   SENSATION: WFL- in all extremities  COORDINATION: Intact with heel to shin BLE  EDEMA:  None observed  LOWER EXTREMITY MMT:    MMT Right Eval Left Eval  Hip flexion 4 4  Hip extension 4 4  Hip abduction 4 4  Hip adduction 4 4  Hip internal rotation 4 4  Hip external rotation 4 4  Knee flexion 4 4  Knee extension 4 4  Ankle dorsiflexion 4 4  Ankle plantarflexion    Ankle inversion 4 4  Ankle eversion 4 4  (Blank rows = not tested)   TRANSFERS: Assistive device utilized: None  Sit to stand: Complete Independence Stand to sit: Complete Independence Chair to chair: Complete Independence Floor:  not tested   GAIT: Gait pattern: decreased arm swing- Right, decreased arm swing- Left, decreased step length- Right, and decreased step length- Left Distance walked: 100+ Assistive device utilized: None Level of assistance: Complete Independence  FUNCTIONAL TESTS:  5 times sit to stand: 16.01 sec without UE support Timed up and go (TUG): 10.91 sec without UE support 10 meter walk test: 1.0 m/s Berg Balance Scale: 50/56  PATIENT SURVEYS:  FOTO 58 with goal of 47  TODAY'S TREATMENT: DATE: 12/28/22   Nustep level 1-3 x 6 min with cues for consistent SPM throughout variable resistance  Standing on wedge gastroc stretch 2 x 30sec  Standing on wedge heel raise 2 x 12   Standing on wedge forward to prevent anterior LOB x 30 sec.  Attempted toe raise standing on wedge, but unable to perform due to Tibialis anterior weakness.  Seated toe raise 2 x 20 with 7.5# on bil knees.    Rocker board AP rocks x 20 with 1 sec hold AP control x 30 sce  AP control while tossing ball of wall x 20  Lateral rocks x 20 each with 1 sec hold  Lateral control 2x 30 sec  Small rocker board of lateral control, ball toss off wall. X 20   Throughout session, PT provided CGA-supervision assist for safety and improved use of ankle control to prevent LOB on dynamic surface.     PATIENT EDUCATION: Education details: Pt educated throughout session about proper posture and technique with exercises. Improved exercise technique, movement at target joints, use of target muscles after min to mod verbal, visual, tactile cues.  Person educated: Patient Education method: Explanation Education comprehension: verbalized understanding  HOME EXERCISE PROGRAM: Access Code: [email or web address] URL: [email or web address] Date: 11/22/2022 Prepared by: Grier Rocher  Exercises - Standing Forward Step Taps with Counter Support  - 1 x daily - 7 x weekly - 3 sets - 12 reps - Romberg Stance with Eyes Closed  - 1 x daily - 7 x weekly - 3 sets - 3 reps - 20 hold - Romberg Stance with Head Nods  - 1 x daily - 7 x weekly - 3 sets - 10 reps - Romberg Stance with Head Rotation  - 1 x daily - 7 x weekly - 3 sets - 10 reps - Seated Hip Abduction with Resistance  - 1 x daily - 7 x weekly - 3 sets - 10 reps - 3 hold - Sitting Knee Extension with Resistance  - 1 x daily -  7 x weekly - 3 sets - 10 reps - 3 hold - Seated March with Resistance  - 1 x daily - 7 x weekly - 3 sets - 10 reps - 2 hold - Standing Tandem Balance with Counter Support  - 1 x daily - 7 x weekly - 4 sets - 4 reps - 20 hold  Access Code: [email or web address] URL: [email or web address] Date: 10/29/2022 Prepared by: Maureen Ralphs  Exercises - Single Leg Stance  - 3 x weekly - 5-10 sets - as long as possible-  hold - Tandem Stance  - 3 x weekly - 3 sets - as long as possible hold   GOALS: Goals reviewed with patient? Yes  SHORT TERM GOALS: Target date: 12/10/2022  Patient will be independent with HEP in order to improve strength and balance in order to decrease fall risk and improve function at home and work.  Baseline: EVAL- No formal HEP in place Goal status: INITIAL  LONG TERM GOALS: Target date: 01/21/2023  1.  Patient (> 74 years old) will complete five times sit to stand test in < 15 seconds indicating an increased LE strength and improved balance. Baseline: 16.01 at eval 12/17/22: 9.88 sec Goal status: MET  2.  Patient will increase FOTO score to equal to or greater than  59   to demonstrate statistically significant improvement in mobility and quality of life.  Baseline: EVAL= 58 12/17/22: 63 Goal status: MET   3.  Patient will increase Berg Balance score by > 3 points to demonstrate decreased fall risk during functional activities. Baseline: EVAL=50/56 12/17/22: 52/56 Goal status: PROGRESSING  4.  Patient will demonstrate improved dynamic standing balance as seen by single leg standing > 10 sec each LE Baseline: EVAL=2-3 sec each LE 12/17/22: 3 sec on L LE, 2 sec on R LE Goal status: INITIAL  ASSESSMENT:  CLINICAL IMPRESSION:  Pt arrived ready to participate in PT treatment on this day. PT treatment focused on BLE ankle strength and use of ankle strategy to correct AP and lateral LOB. Noted difficulty with Df in standing requiring adaptation to be performed with movement in sitting due to weakness.  Pt will continue to benefit from skilled therapy to address remaining deficits in order to improve overall QoL and return to PLOF.     OBJECTIVE IMPAIRMENTS: decreased balance, decreased coordination, difficulty walking, decreased strength, and postural dysfunction.   ACTIVITY  LIMITATIONS: carrying, lifting, bending, standing, and squatting  PARTICIPATION LIMITATIONS: community activity and yard work  PERSONAL FACTORS: 1-2 comorbidities: HTN; DM  are also affecting patient's functional outcome.   REHAB POTENTIAL: Good  CLINICAL DECISION MAKING: Stable/uncomplicated  EVALUATION COMPLEXITY: Low  PLAN:  PT FREQUENCY: 1-2x/week  PT DURATION: 12 weeks  PLANNED INTERVENTIONS: Therapeutic exercises, Therapeutic activity, Neuromuscular re-education, Balance training, Gait training, Patient/Family education, Self Care, Joint mobilization, Joint manipulation, Stair training, Vestibular training, Canalith repositioning, DME instructions, Dry Needling, Electrical stimulation, Spinal manipulation, Spinal mobilization, Cryotherapy, Moist heat, Taping, Manual therapy, and Re-evaluation  PLAN FOR NEXT SESSION:   Dynamic and balance with emphasis on single limb control.  Core stabilization/strengthening.   Ankle strengthening.    Grier Rocher PT, DPT  Physical Therapist - [place]  [facility]  8:53 AM 12/28/22

## 2023-01-01 ENCOUNTER — Ambulatory Visit: Payer: Medicare Other | Attending: Internal Medicine | Admitting: Physical Therapy

## 2023-01-01 DIAGNOSIS — R278 Other lack of coordination: Secondary | ICD-10-CM | POA: Diagnosis present

## 2023-01-01 DIAGNOSIS — R262 Difficulty in walking, not elsewhere classified: Secondary | ICD-10-CM | POA: Insufficient documentation

## 2023-01-01 DIAGNOSIS — M6281 Muscle weakness (generalized): Secondary | ICD-10-CM | POA: Insufficient documentation

## 2023-01-01 DIAGNOSIS — R269 Unspecified abnormalities of gait and mobility: Secondary | ICD-10-CM | POA: Diagnosis present

## 2023-01-01 DIAGNOSIS — R2681 Unsteadiness on feet: Secondary | ICD-10-CM | POA: Diagnosis present

## 2023-01-01 NOTE — Therapy (Signed)
OUTPATIENT PHYSICAL THERAPY NEURO TREATMENT  Patient Name: Calvin Roth MRN: [id number] DOB:January 28, 1946, 77 y.o., male Today's Date: 01/01/2023   PCP: Dr. Einar Crow REFERRING PROVIDER: Dr. Einar Crow  END OF SESSION:  PT End of Session - 01/01/23 1410     Visit Number 13    Number of Visits 24    Date for PT Re-Evaluation 01/21/23    Progress Note Due on Visit 10    PT Start Time 1410    PT Stop Time 1445    PT Time Calculation (min) 35 min    Equipment Utilized During Treatment Gait belt    Activity Tolerance Patient tolerated treatment well    Behavior During Therapy WFL for tasks assessed/performed               Past Medical History:  Diagnosis Date   Actinic keratosis    Aortic atherosclerosis (HCC)    Aortic root dilatation (HCC) 03/12/2019   a.) CTA 03/22/2019 --> 4.5 cm   Ascending aortic aneurysm (HCC) 10/31/2017   a.) fusiform; measured 4.4 cm   BPH (benign prostatic hyperplasia)    CKD (chronic kidney disease), stage III (HCC)    Coronary artery disease    DDD (degenerative disc disease), lumbar    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 Moderate to marked atypia, edge involved. Excised 09/12/2006, margins free.   Dysplastic nevus 11/19/2007   Left costal margin/infrapectoral. Slight to moderate atypia, margin focally involved.   Dysplastic nevus 11/19/2007   Right lateral flank near waistline. Moderate atypia, extends to one edge.    Dysplastic nevus 05/25/2019   Left lateral tricep. Moderate atypia, deep margin involved.    GERD (gastroesophageal reflux disease)    Hepatic steatosis    Hyperlipidemia    Hypertension    Intermittent alternating exotropia    Neurogenic bladder    a.) self catheriterizes FOUR times daily   Osteoarthritis    Serrated adenoma of colon 07/17/2016   Sliding hiatal hernia    T2DM (type 2 diabetes mellitus) (HCC)    Past Surgical History:   Procedure Laterality Date   CATARACT EXTRACTION Left 05/15/2018   CATARACT EXTRACTION Right 04/03/2018   CHOLECYSTECTOMY     COLONOSCOPY WITH PROPOFOL N/A 07/17/2016   Procedure: COLONOSCOPY WITH PROPOFOL;  Surgeon: Christena Deem, MD;  Location: [facility] ENDOSCOPY;  Service: Endoscopy;  Laterality: N/A;   COLONOSCOPY WITH PROPOFOL N/A 02/04/2018   Procedure: COLONOSCOPY WITH PROPOFOL;  Surgeon: Christena Deem, MD;  Location: [facility] ENDOSCOPY;  Service: Endoscopy;  Laterality: N/A;   EYE SURGERY Left 2000   Strabismus correction   FOREIGN BODY REMOVAL Left 10/23/2019   Procedure: REMOVAL FOREIGN BODY LEFT HEEL;  Surgeon: Gwyneth Revels, DPM;  Location: [facility] ORS;  Service: Podiatry;  Laterality: Left;   GASTRIC RESTRICTION SURGERY N/A 1982   "stomach stapled for weight loss"   LUMBAR FUSION N/A 02/10/2020   Procedure: PLIF,IP,PSI L2- L3;EXPL FUSION; Location: [facility]; Surgeon: Tressie Stalker, MD   POSTERIOR LUMBAR FUSION N/A 05/04/2015   Procedure: LUMBAR THREE-FOUR, LUMBAR FOUR-FIVE POSTERIOR LUMBAR FUSION; Location: [facility]; Surgeon: Tressie Stalker, MD   TONSILLECTOMY Bilateral 1954   TRANSANAL EXCISION OF RECTAL MASS N/A 09/18/2016   Procedure: TRANSANAL EXCISION OF RECTAL TUMOR;  Surgeon: Kieth Brightly, MD;  Location: [facility] ORS;  Service: General;  Laterality: N/A;   ULNAR COLLATERAL LIGAMENT REPAIR Left 12/28/2020   Procedure: LEFT THUMB  ULNAR COLLATERAL LIGAMENT REPAIR/RECONSTRUCTION;  Surgeon: Christena Flake, MD;  Location: [facility] ORS;  Service: Orthopedics;  Laterality: Left;   Patient Active Problem List   Diagnosis Date Noted   Spondylolisthesis of lumbar region 05/04/2015   Degeneration of intervertebral disc of lumbar region 10/19/2014   Neuritis or radiculitis due to rupture of lumbar intervertebral disc 10/19/2014   Lumbar canal stenosis 10/19/2014   Morbid obesity (HCC) 01/23/2014   Benign prostatic hyperplasia with urinary obstruction  10/01/2013   Type 2 diabetes mellitus (HCC) 10/01/2013   Benign hypertension 10/01/2013   Spinal stenosis 10/01/2013    ONSET DATE: around July 2023  REFERRING DIAG: R27.0 (ICD-10-CM) - Ataxia, unspecified   THERAPY DIAG:  Unsteadiness on feet  Abnormality of gait and mobility  Difficulty in walking, not elsewhere classified  Muscle weakness (generalized)  Other lack of coordination  Rationale for Evaluation and Treatment: Rehabilitation  SUBJECTIVE:                                                                                                                                                                                             SUBJECTIVE STATEMENT:  Pt arrives late to scheduled PT treatment. Pt reports that he is doing well this afternoon. No pain to report. States that he feels like his feet are still flopping when walking for longer distance.   Pt accompanied by: self  PERTINENT HISTORY: Past Medical History:  Diagnosis Date  Diabetes mellitus type 2, uncomplicated (CMS/HHS-HCC)  Hyperlipidemia  Hypertension  Serrated adenoma of colon 07/17/2016   Past Surgical History:  Procedure Laterality Date  COLONOSCOPY 07/17/2016  Serrated adenoma/Repeat 68yr/MUS  COLONOSCOPY 02/04/2018  Tubular adenoma of the colon/Hyperplastic colon polyp/Repeat 59yrs/MUS  CATARACT EXTRACTION W/ INTRAOCULAR LENS IMPLANT Right 04/2018  LENS EYE SURGERY Right 04/03/2018  standard monofocal  CATARACT EXTRACTION Left 05/2018  LENS EYE SURGERY Left 05/15/2018  back surgery 02/10/2020  fused L2 and L3  Primary repair of ulnar collateral ligament with internal brace augmentation, left thumb Left 12/28/2020  Dr.Poggi  COLONOSCOPY 01/25/2022  PHxCP/normal colon/no repeat/CTL  CHOLECYSTECTOMY  COLONOSCOPY 04/15/2004, 07/13/2009  Correction of strabismus, left eye  EXPLORATION OF SPINAL FUSION  spinal fusion 05/2015  Gastric stapling  TONSILLECTOMY    PAIN:  Are you having pain?  No  PRECAUTIONS: Fall  RED FLAGS: None   WEIGHT BEARING RESTRICTIONS: No  FALLS: Has patient fallen in last 6 months? No  LIVING ENVIRONMENT: Lives with: lives with their spouse Lives in: House/apartment Stairs: Yes: Internal: 10-14 steps; on right going up and External: 5 steps; on left going up Has following equipment at home: None  PLOF: Independent  PATIENT GOALS: I would like to be more stable and stand on 1 foot and have better balance. I want to learn ways to regain balance  OBJECTIVE:   DIAGNOSTIC FINDINGS: none recent  COGNITION: Overall cognitive status: Within functional limits for tasks assessed   SENSATION: WFL- in all extremities  COORDINATION: Intact with heel to shin BLE  EDEMA:  None observed  LOWER EXTREMITY MMT:    MMT Right Eval Left Eval  Hip flexion 4 4  Hip extension 4 4  Hip abduction 4 4  Hip adduction 4 4  Hip internal rotation 4 4  Hip external rotation 4 4  Knee flexion 4 4  Knee extension 4 4  Ankle dorsiflexion 4 4  Ankle plantarflexion    Ankle inversion 4 4  Ankle eversion 4 4  (Blank rows = not tested)   TRANSFERS: Assistive device utilized: None  Sit to stand: Complete Independence Stand to sit: Complete Independence Chair to chair: Complete Independence Floor:  not tested   GAIT: Gait pattern: decreased arm swing- Right, decreased arm swing- Left, decreased step length- Right, and decreased step length- Left Distance walked: 100+ Assistive device utilized: None Level of assistance: Complete Independence  FUNCTIONAL TESTS:  5 times sit to stand: 16.01 sec without UE support Timed up and go (TUG): 10.91 sec without UE support 10 meter walk test: 1.0 m/s Berg Balance Scale: 50/56  PATIENT SURVEYS:  FOTO 58 with goal of 61  TODAY'S TREATMENT: DATE: 01/01/23   PT applied 7# ankle weights:  Gait without assist and weights in place x 517ft and 1031ft. Noted to have increased RLE foot slap with fatigue at  end of second bout.  Stair management with ankle weights x 18steps, step through pattern cues for improved toe off with ascent.   LAQ with 7# 2x15 bil  Side step with squat and 7# ankle weights 51ft x 4 bil  Calf raise on leg press 85# x 10 in stretched position, 55# x 10 with improved ROM.   Gait belt in place and supervision assist from PT for safety with weighted gait training.    PATIENT EDUCATION: Education details: Pt educated throughout session about proper posture and technique with exercises. Improved exercise technique, movement at target joints, use of target muscles after min to mod verbal, visual, tactile cues.  Person educated: Patient Education method: Explanation Education comprehension: verbalized understanding  HOME EXERCISE PROGRAM: Access Code: [email or web address] URL: [email or web address] Date: 11/22/2022 Prepared by: Grier Rocher  Exercises - Standing Forward Step Taps with Counter Support  - 1 x daily - 7 x weekly - 3 sets - 12 reps - Romberg Stance with Eyes Closed  - 1 x daily - 7 x weekly - 3 sets - 3 reps - 20 hold - Romberg Stance with Head Nods  - 1 x daily - 7 x weekly - 3 sets - 10 reps - Romberg Stance with Head Rotation  - 1 x daily - 7 x weekly - 3 sets - 10 reps - Seated Hip Abduction with Resistance  - 1 x daily - 7 x weekly - 3 sets - 10 reps - 3 hold - Sitting Knee Extension with Resistance  - 1 x daily - 7 x weekly - 3 sets - 10 reps - 3 hold - Seated March with Resistance  - 1 x daily - 7 x weekly - 3 sets - 10 reps - 2 hold - Standing Tandem Balance with Counter Support  - 1  x daily - 7 x weekly - 4 sets - 4 reps - 20 hold  Access Code: [email or web address] URL: [email or web address] Date: 10/29/2022 Prepared by: Maureen Ralphs  Exercises - Single Leg Stance  - 3 x weekly - 5-10 sets - as long as possible-  hold - Tandem Stance  - 3 x weekly - 3 sets - as long as possible hold   GOALS: Goals reviewed with patient?  Yes  SHORT TERM GOALS: Target date: 12/10/2022  Patient will be independent with HEP in order to improve strength and balance in order to decrease fall risk and improve function at home and work.  Baseline: EVAL- No formal HEP in place Goal status: INITIAL  LONG TERM GOALS: Target date: 01/21/2023  1.  Patient (> 29 years old) will complete five times sit to stand test in < 15 seconds indicating an increased LE strength and improved balance. Baseline: 16.01 at eval 12/17/22: 9.88 sec Goal status: MET  2.  Patient will increase FOTO score to equal to or greater than  59   to demonstrate statistically significant improvement in mobility and quality of life.  Baseline: EVAL= 58 12/17/22: 63 Goal status: MET   3.  Patient will increase Berg Balance score by > 3 points to demonstrate decreased fall risk during functional activities. Baseline: EVAL=50/56 12/17/22: 52/56 Goal status: PROGRESSING  4.  Patient will demonstrate improved dynamic standing balance as seen by single leg standing > 10 sec each LE Baseline: EVAL=2-3 sec each LE 12/17/22: 3 sec on L LE, 2 sec on R LE Goal status: INITIAL  ASSESSMENT:  CLINICAL IMPRESSION:  Pt arrived late to scheduled PT treatment. PT treatment focused on functional gait and BLE strengthening. Noted to have increased foot slap and poor toe off with fatige in the RLE compared to LLE on this day. Pt reports LLE is usually weaker than the R. Responded well to instruction to correct poor exercise technique and increase motor recruitment and posture with dynamic exercise.   Pt will continue to benefit from skilled therapy to address remaining deficits in order to improve overall QoL and return to PLOF.     OBJECTIVE IMPAIRMENTS: decreased balance, decreased coordination, difficulty walking, decreased strength, and postural dysfunction.   ACTIVITY LIMITATIONS: carrying, lifting, bending, standing, and squatting  PARTICIPATION LIMITATIONS: community  activity and yard work  PERSONAL FACTORS: 1-2 comorbidities: HTN; DM  are also affecting patient's functional outcome.   REHAB POTENTIAL: Good  CLINICAL DECISION MAKING: Stable/uncomplicated  EVALUATION COMPLEXITY: Low  PLAN:  PT FREQUENCY: 1-2x/week  PT DURATION: 12 weeks  PLANNED INTERVENTIONS: Therapeutic exercises, Therapeutic activity, Neuromuscular re-education, Balance training, Gait training, Patient/Family education, Self Care, Joint mobilization, Joint manipulation, Stair training, Vestibular training, Canalith repositioning, DME instructions, Dry Needling, Electrical stimulation, Spinal manipulation, Spinal mobilization, Cryotherapy, Moist heat, Taping, Manual therapy, and Re-evaluation  PLAN FOR NEXT SESSION:   Dynamic and balance with emphasis on single limb control.  Core stabilization/strengthening.   Ankle strengthening.    Grier Rocher PT, DPT  Physical Therapist - [facility]  2:10 PM 01/01/23

## 2023-01-03 ENCOUNTER — Encounter: Payer: Self-pay | Admitting: Physical Therapy

## 2023-01-03 ENCOUNTER — Ambulatory Visit: Payer: Medicare Other | Admitting: Physical Therapy

## 2023-01-03 DIAGNOSIS — M6281 Muscle weakness (generalized): Secondary | ICD-10-CM

## 2023-01-03 DIAGNOSIS — R2681 Unsteadiness on feet: Secondary | ICD-10-CM

## 2023-01-03 DIAGNOSIS — R269 Unspecified abnormalities of gait and mobility: Secondary | ICD-10-CM

## 2023-01-03 DIAGNOSIS — R262 Difficulty in walking, not elsewhere classified: Secondary | ICD-10-CM

## 2023-01-03 NOTE — Therapy (Signed)
OUTPATIENT PHYSICAL THERAPY NEURO TREATMENT  Patient Name: Calvin Roth MRN: [id number] DOB:12/11/1945, 77 y.o., male Today's Date: 01/03/2023   PCP: Dr. Einar Crow REFERRING PROVIDER: Dr. Einar Crow  END OF SESSION:  PT End of Session - 01/03/23 0838     Visit Number 14    Number of Visits 24    Date for PT Re-Evaluation 01/21/23    Progress Note Due on Visit 20    PT Start Time 0808    PT Stop Time 0844    PT Time Calculation (min) 36 min    Equipment Utilized During Treatment Gait belt    Activity Tolerance Patient tolerated treatment well    Behavior During Therapy [facility] for tasks assessed/performed                Past Medical History:  Diagnosis Date   Actinic keratosis    Aortic atherosclerosis (HCC)    Aortic root dilatation (HCC) 03/12/2019   a.) CTA 03/22/2019 --> 4.5 cm   Ascending aortic aneurysm (HCC) 10/31/2017   a.) fusiform; measured 4.4 cm   BPH (benign prostatic hyperplasia)    CKD (chronic kidney disease), stage III (HCC)    Coronary artery disease    DDD (degenerative disc disease), lumbar    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 Moderate to marked atypia, edge involved. Excised 09/12/2006, margins free.   Dysplastic nevus 11/19/2007   Left costal margin/infrapectoral. Slight to moderate atypia, margin focally involved.   Dysplastic nevus 11/19/2007   Right lateral flank near waistline. Moderate atypia, extends to one edge.    Dysplastic nevus 05/25/2019   Left lateral tricep. Moderate atypia, deep margin involved.    GERD (gastroesophageal reflux disease)    Hepatic steatosis    Hyperlipidemia    Hypertension    Intermittent alternating exotropia    Neurogenic bladder    a.) self catheriterizes FOUR times daily   Osteoarthritis    Serrated adenoma of colon 07/17/2016   Sliding hiatal hernia    T2DM (type 2 diabetes mellitus) (HCC)    Past Surgical  History:  Procedure Laterality Date   CATARACT EXTRACTION Left 05/15/2018   CATARACT EXTRACTION Right 04/03/2018   CHOLECYSTECTOMY     COLONOSCOPY WITH PROPOFOL N/A 07/17/2016   Procedure: COLONOSCOPY WITH PROPOFOL;  Surgeon: Christena Deem, MD;  Location: [facility] ENDOSCOPY;  Service: Endoscopy;  Laterality: N/A;   COLONOSCOPY WITH PROPOFOL N/A 02/04/2018   Procedure: COLONOSCOPY WITH PROPOFOL;  Surgeon: Christena Deem, MD;  Location: [facility] ENDOSCOPY;  Service: Endoscopy;  Laterality: N/A;   EYE SURGERY Left 2000   Strabismus correction   FOREIGN BODY REMOVAL Left 10/23/2019   Procedure: REMOVAL FOREIGN BODY LEFT HEEL;  Surgeon: Gwyneth Revels, DPM;  Location: [facility] ORS;  Service: Podiatry;  Laterality: Left;   GASTRIC RESTRICTION SURGERY N/A 1982   "stomach stapled for weight loss"   LUMBAR FUSION N/A 02/10/2020   Procedure: PLIF,IP,PSI L2- L3;EXPL FUSION; Location: [facility]; Surgeon: Tressie Stalker, MD   POSTERIOR LUMBAR FUSION N/A 05/04/2015   Procedure: LUMBAR THREE-FOUR, LUMBAR FOUR-FIVE POSTERIOR LUMBAR FUSION; Location: [facility]; Surgeon: Tressie Stalker, MD   TONSILLECTOMY Bilateral 1954   TRANSANAL EXCISION OF RECTAL MASS N/A 09/18/2016   Procedure: TRANSANAL EXCISION OF RECTAL TUMOR;  Surgeon: Kieth Brightly, MD;  Location: [facility] ORS;  Service: General;  Laterality: N/A;   ULNAR COLLATERAL LIGAMENT REPAIR Left 12/28/2020   Procedure: LEFT  THUMB ULNAR COLLATERAL LIGAMENT REPAIR/RECONSTRUCTION;  Surgeon: Christena Flake, MD;  Location: [facility] ORS;  Service: Orthopedics;  Laterality: Left;   Patient Active Problem List   Diagnosis Date Noted   Spondylolisthesis of lumbar region 05/04/2015   Degeneration of intervertebral disc of lumbar region 10/19/2014   Neuritis or radiculitis due to rupture of lumbar intervertebral disc 10/19/2014   Lumbar canal stenosis 10/19/2014   Morbid obesity (HCC) 01/23/2014   Benign prostatic hyperplasia with urinary  obstruction 10/01/2013   Type 2 diabetes mellitus (HCC) 10/01/2013   Benign hypertension 10/01/2013   Spinal stenosis 10/01/2013    ONSET DATE: around July 2023  REFERRING DIAG: R27.0 (ICD-10-CM) - Ataxia, unspecified   THERAPY DIAG:  Unsteadiness on feet  Abnormality of gait and mobility  Difficulty in walking, not elsewhere classified  Muscle weakness (generalized)  Rationale for Evaluation and Treatment: Rehabilitation  SUBJECTIVE:                                                                                                                                                                                             SUBJECTIVE STATEMENT:  Pt arrives late to scheduled PT treatment. Pt reports that he is doing well this afternoon. No pain to report. States that he feels like his feet are still flopping when walking for longer distance.   Pt accompanied by: self  PERTINENT HISTORY: Past Medical History:  Diagnosis Date  Diabetes mellitus type 2, uncomplicated (CMS/HHS-HCC)  Hyperlipidemia  Hypertension  Serrated adenoma of colon 07/17/2016   Past Surgical History:  Procedure Laterality Date  COLONOSCOPY 07/17/2016  Serrated adenoma/Repeat 89yr/MUS  COLONOSCOPY 02/04/2018  Tubular adenoma of the colon/Hyperplastic colon polyp/Repeat 89yrs/MUS  CATARACT EXTRACTION W/ INTRAOCULAR LENS IMPLANT Right 04/2018  LENS EYE SURGERY Right 04/03/2018  standard monofocal  CATARACT EXTRACTION Left 05/2018  LENS EYE SURGERY Left 05/15/2018  back surgery 02/10/2020  fused L2 and L3  Primary repair of ulnar collateral ligament with internal brace augmentation, left thumb Left 12/28/2020  Dr.Poggi  COLONOSCOPY 01/25/2022  PHxCP/normal colon/no repeat/CTL  CHOLECYSTECTOMY  COLONOSCOPY 04/15/2004, 07/13/2009  Correction of strabismus, left eye  EXPLORATION OF SPINAL FUSION  spinal fusion 05/2015  Gastric stapling  TONSILLECTOMY    PAIN:  Are you having pain? No  PRECAUTIONS:  Fall  RED FLAGS: None   WEIGHT BEARING RESTRICTIONS: No  FALLS: Has patient fallen in last 6 months? No  LIVING ENVIRONMENT: Lives with: lives with their spouse Lives in: House/apartment Stairs: Yes: Internal: 10-14 steps; on right going up and External: 5 steps; on left going up Has following equipment at home: None  PLOF: Independent  PATIENT  GOALS: I would like to be more stable and stand on 1 foot and have better balance. I want to learn ways to regain balance  OBJECTIVE:   DIAGNOSTIC FINDINGS: none recent  COGNITION: Overall cognitive status: Within functional limits for tasks assessed   SENSATION: WFL- in all extremities  COORDINATION: Intact with heel to shin BLE  EDEMA:  None observed  LOWER EXTREMITY MMT:    MMT Right Eval Left Eval  Hip flexion 4 4  Hip extension 4 4  Hip abduction 4 4  Hip adduction 4 4  Hip internal rotation 4 4  Hip external rotation 4 4  Knee flexion 4 4  Knee extension 4 4  Ankle dorsiflexion 4 4  Ankle plantarflexion    Ankle inversion 4 4  Ankle eversion 4 4  (Blank rows = not tested)   TRANSFERS: Assistive device utilized: None  Sit to stand: Complete Independence Stand to sit: Complete Independence Chair to chair: Complete Independence Floor:  not tested   GAIT: Gait pattern: decreased arm swing- Right, decreased arm swing- Left, decreased step length- Right, and decreased step length- Left Distance walked: 100+ Assistive device utilized: None Level of assistance: Complete Independence  FUNCTIONAL TESTS:  5 times sit to stand: 16.01 sec without UE support Timed up and go (TUG): 10.91 sec without UE support 10 meter walk test: 1.0 m/s Berg Balance Scale: 50/56  PATIENT SURVEYS:  FOTO 58 with goal of 63  TODAY'S TREATMENT: DATE: 01/03/23  TE  PT applied 7# ankle weights:  Walked approximately 16 minutes including 1.5 minute therapeutic rest break including th ebelow along the way -Stair management  with ankle weights x 23 steps, step through pattern cues for improved toe off with ascent.  Ambulating on incline and decline through the healing gardens ( solid surface throughout) -turns -all with focus on preventing foot slap at initial contact on the right, overall good ability to maintain focus on foot   LAQ with 7# 2x15 bil  Side step with squat and 7# ankle weights 39ft x 4 bil  Calf raise with 7# AW donned x 10 with straight knees,  -x 10 with bent knees  -X 10 in squat position  -X 10 with feet on step for increased DF ROM  Standing at support bar with cues for no UE support  2 x 10 hip abd with 7# AW   Step up without UE support x 10 ea LE   Gait belt in place and supervision assist from PT for safety with weighted gait training.    PATIENT EDUCATION: Education details: Pt educated throughout session about proper posture and technique with exercises.   Person educated: Patient Education method: Explanation Education comprehension: verbalized understanding  HOME EXERCISE PROGRAM: Access Code: [email or web address] URL: [email or web address] Date: 11/22/2022 Prepared by: Grier Rocher  Exercises - Standing Forward Step Taps with Counter Support  - 1 x daily - 7 x weekly - 3 sets - 12 reps - Romberg Stance with Eyes Closed  - 1 x daily - 7 x weekly - 3 sets - 3 reps - 20 hold - Romberg Stance with Head Nods  - 1 x daily - 7 x weekly - 3 sets - 10 reps - Romberg Stance with Head Rotation  - 1 x daily - 7 x weekly - 3 sets - 10 reps - Seated Hip Abduction with Resistance  - 1 x daily - 7 x weekly - 3 sets - 10 reps - 3 hold - Sitting Knee  Extension with Resistance  - 1 x daily - 7 x weekly - 3 sets - 10 reps - 3 hold - Seated March with Resistance  - 1 x daily - 7 x weekly - 3 sets - 10 reps - 2 hold - Standing Tandem Balance with Counter Support  - 1 x daily - 7 x weekly - 4 sets - 4 reps - 20 hold  Access Code: [email or web address] URL:  [email or web address] Date: 10/29/2022 Prepared by: Maureen Ralphs  Exercises - Single Leg Stance  - 3 x weekly - 5-10 sets - as long as possible-  hold - Tandem Stance  - 3 x weekly - 3 sets - as long as possible hold   GOALS: Goals reviewed with patient? Yes  SHORT TERM GOALS: Target date: 12/10/2022  Patient will be independent with HEP in order to improve strength and balance in order to decrease fall risk and improve function at home and work.  Baseline: EVAL- No formal HEP in place Goal status: INITIAL  LONG TERM GOALS: Target date: 01/21/2023  1.  Patient (> 76 years old) will complete five times sit to stand test in < 15 seconds indicating an increased LE strength and improved balance. Baseline: 16.01 at eval 12/17/22: 9.88 sec Goal status: MET  2.  Patient will increase FOTO score to equal to or greater than  59   to demonstrate statistically significant improvement in mobility and quality of life.  Baseline: EVAL= 58 12/17/22: 63 Goal status: MET   3.  Patient will increase Berg Balance score by > 3 points to demonstrate decreased fall risk during functional activities. Baseline: EVAL=50/56 12/17/22: 52/56 Goal status: PROGRESSING  4.  Patient will demonstrate improved dynamic standing balance as seen by single leg standing > 10 sec each LE Baseline: EVAL=2-3 sec each LE 12/17/22: 3 sec on L LE, 2 sec on R LE Goal status: INITIAL  ASSESSMENT:  CLINICAL IMPRESSION:  Pt arrived late to scheduled PT treatment.  Pt progressed with prolonged ambulation with AW this date showing improved control of foot initial contact throughout with cues. Pt still feels weaker on the L> R LE and this was evident with SL step ups. Pt instructed to practice this at home for continued improvement in strength and mobility of this functional task. Pt will continue to benefit from skilled physical therapy intervention to address impairments, improve QOL, and attain therapy  goals.   Pt will continue to benefit from skilled therapy to address remaining deficits in order to improve overall QoL and return to PLOF.     OBJECTIVE IMPAIRMENTS: decreased balance, decreased coordination, difficulty walking, decreased strength, and postural dysfunction.   ACTIVITY LIMITATIONS: carrying, lifting, bending, standing, and squatting  PARTICIPATION LIMITATIONS: community activity and yard work  PERSONAL FACTORS: 1-2 comorbidities: HTN; DM  are also affecting patient's functional outcome.   REHAB POTENTIAL: Good  CLINICAL DECISION MAKING: Stable/uncomplicated  EVALUATION COMPLEXITY: Low  PLAN:  PT FREQUENCY: 1-2x/week  PT DURATION: 12 weeks  PLANNED INTERVENTIONS: Therapeutic exercises, Therapeutic activity, Neuromuscular re-education, Balance training, Gait training, Patient/Family education, Self Care, Joint mobilization, Joint manipulation, Stair training, Vestibular training, Canalith repositioning, DME instructions, Dry Needling, Electrical stimulation, Spinal manipulation, Spinal mobilization, Cryotherapy, Moist heat, Taping, Manual therapy, and Re-evaluation  PLAN FOR NEXT SESSION:   Dynamic and balance with emphasis on single limb control.  Core stabilization/strengthening.   Ankle strengthening.    Norman Herrlich PT ,DPT Physical Therapist- [facility]   9:26 AM  01/03/23

## 2023-01-08 ENCOUNTER — Ambulatory Visit: Payer: Medicare Other | Admitting: Physical Therapy

## 2023-01-10 ENCOUNTER — Ambulatory Visit: Payer: Medicare Other | Admitting: Physical Therapy

## 2023-01-14 ENCOUNTER — Ambulatory Visit: Payer: Medicare Other | Admitting: Physical Therapy

## 2023-01-14 DIAGNOSIS — R2681 Unsteadiness on feet: Secondary | ICD-10-CM

## 2023-01-14 DIAGNOSIS — R262 Difficulty in walking, not elsewhere classified: Secondary | ICD-10-CM

## 2023-01-14 DIAGNOSIS — M6281 Muscle weakness (generalized): Secondary | ICD-10-CM

## 2023-01-14 DIAGNOSIS — R269 Unspecified abnormalities of gait and mobility: Secondary | ICD-10-CM

## 2023-01-14 DIAGNOSIS — R278 Other lack of coordination: Secondary | ICD-10-CM

## 2023-01-14 NOTE — Therapy (Signed)
OUTPATIENT PHYSICAL THERAPY NEURO TREATMENT  Patient Name: Calvin Roth MRN: [id number] DOB:Oct 22, 1945, 77 y.o., male Today's Date: 01/14/2023   PCP: Dr. Einar Crow REFERRING PROVIDER: Dr. Einar Crow  END OF SESSION:  PT End of Session - 01/14/23 0805     Visit Number 15    Number of Visits 24    Date for PT Re-Evaluation 01/21/23    Progress Note Due on Visit 20    PT Start Time 0805    PT Stop Time 0845    PT Time Calculation (min) 40 min    Equipment Utilized During Treatment Gait belt    Activity Tolerance Patient tolerated treatment well    Behavior During Therapy [facility] for tasks assessed/performed                Past Medical History:  Diagnosis Date   Actinic keratosis    Aortic atherosclerosis (HCC)    Aortic root dilatation (HCC) 03/12/2019   a.) CTA 03/22/2019 --> 4.5 cm   Ascending aortic aneurysm (HCC) 10/31/2017   a.) fusiform; measured 4.4 cm   BPH (benign prostatic hyperplasia)    CKD (chronic kidney disease), stage III (HCC)    Coronary artery disease    DDD (degenerative disc disease), lumbar    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 Moderate to marked atypia, edge involved. Excised 09/12/2006, margins free.   Dysplastic nevus 11/19/2007   Left costal margin/infrapectoral. Slight to moderate atypia, margin focally involved.   Dysplastic nevus 11/19/2007   Right lateral flank near waistline. Moderate atypia, extends to one edge.    Dysplastic nevus 05/25/2019   Left lateral tricep. Moderate atypia, deep margin involved.    GERD (gastroesophageal reflux disease)    Hepatic steatosis    Hyperlipidemia    Hypertension    Intermittent alternating exotropia    Neurogenic bladder    a.) self catheriterizes FOUR times daily   Osteoarthritis    Serrated adenoma of colon 07/17/2016   Sliding hiatal hernia    T2DM (type 2 diabetes mellitus) (HCC)    Past Surgical  History:  Procedure Laterality Date   CATARACT EXTRACTION Left 05/15/2018   CATARACT EXTRACTION Right 04/03/2018   CHOLECYSTECTOMY     COLONOSCOPY WITH PROPOFOL N/A 07/17/2016   Procedure: COLONOSCOPY WITH PROPOFOL;  Surgeon: Christena Deem, MD;  Location[facility] ENDOSCOPY;  Service: Endoscopy;  Laterality: N/A;   COLONOSCOPY WITH PROPOFOL N/A 02/04/2018   Procedure: COLONOSCOPY WITH PROPOFOL;  Surgeon: Christena Deem, MD;  Location: [facility] ENDOSCOPY;  Service: Endoscopy;  Laterality: N/A;   EYE SURGERY Left 2000   Strabismus correction   FOREIGN BODY REMOVAL Left 10/23/2019   Procedure: REMOVAL FOREIGN BODY LEFT HEEL;  Surgeon: Gwyneth Revels, DPM;  Location: [facility] ORS;  Service: Podiatry;  Laterality: Left;   GASTRIC RESTRICTION SURGERY N/A 1982   "stomach stapled for weight loss"   LUMBAR FUSION N/A 02/10/2020   Procedure: PLIF,IP,PSI L2- L3;EXPL FUSION; Location: [facility]; Surgeon: Tressie Stalker, MD   POSTERIOR LUMBAR FUSION N/A 05/04/2015   Procedure: LUMBAR THREE-FOUR, LUMBAR FOUR-FIVE POSTERIOR LUMBAR FUSION; Location: [facility]; Surgeon: Tressie Stalker, MD   TONSILLECTOMY Bilateral 1954   TRANSANAL EXCISION OF RECTAL MASS N/A 09/18/2016   Procedure: TRANSANAL EXCISION OF RECTAL TUMOR;  Surgeon: Kieth Brightly, MD;  Location: [facility] ORS;  Service: General;  Laterality: N/A;   ULNAR COLLATERAL LIGAMENT REPAIR Left 12/28/2020   Procedure: LEFT  THUMB ULNAR COLLATERAL LIGAMENT REPAIR/RECONSTRUCTION;  Surgeon: Christena Flake, MD;  Location: [facility] ORS;  Service: Orthopedics;  Laterality: Left;   Patient Active Problem List   Diagnosis Date Noted   Spondylolisthesis of lumbar region 05/04/2015   Degeneration of intervertebral disc of lumbar region 10/19/2014   Neuritis or radiculitis due to rupture of lumbar intervertebral disc 10/19/2014   Lumbar canal stenosis 10/19/2014   Morbid obesity (HCC) 01/23/2014   Benign prostatic hyperplasia with urinary  obstruction 10/01/2013   Type 2 diabetes mellitus (HCC) 10/01/2013   Benign hypertension 10/01/2013   Spinal stenosis 10/01/2013    ONSET DATE: around July 2023  REFERRING DIAG: R27.0 (ICD-10-CM) - Ataxia, unspecified   THERAPY DIAG:  Unsteadiness on feet  Abnormality of gait and mobility  Difficulty in walking, not elsewhere classified  Muscle weakness (generalized)  Other lack of coordination  Rationale for Evaluation and Treatment: Rehabilitation  SUBJECTIVE:                                                                                                                                                                                             SUBJECTIVE STATEMENT:  Pt reports that he is doing well. States that he has been going to the [employer] for the last couple weeks in conjunction with PT.   Pt accompanied by: self  PERTINENT HISTORY: Past Medical History:  Diagnosis Date  Diabetes mellitus type 2, uncomplicated (CMS/HHS-HCC)  Hyperlipidemia  Hypertension  Serrated adenoma of colon 07/17/2016   Past Surgical History:  Procedure Laterality Date  COLONOSCOPY 07/17/2016  Serrated adenoma/Repeat 30yr/MUS  COLONOSCOPY 02/04/2018  Tubular adenoma of the colon/Hyperplastic colon polyp/Repeat 28yrs/MUS  CATARACT EXTRACTION W/ INTRAOCULAR LENS IMPLANT Right 04/2018  LENS EYE SURGERY Right 04/03/2018  standard monofocal  CATARACT EXTRACTION Left 05/2018  LENS EYE SURGERY Left 05/15/2018  back surgery 02/10/2020  fused L2 and L3  Primary repair of ulnar collateral ligament with internal brace augmentation, left thumb Left 12/28/2020  Dr.Poggi  COLONOSCOPY 01/25/2022  PHxCP/normal colon/no repeat/CTL  CHOLECYSTECTOMY  COLONOSCOPY 04/15/2004, 07/13/2009  Correction of strabismus, left eye  EXPLORATION OF SPINAL FUSION  spinal fusion 05/2015  Gastric stapling  TONSILLECTOMY    PAIN:  Are you having pain? No  PRECAUTIONS: Fall  RED  FLAGS: None   WEIGHT BEARING RESTRICTIONS: No  FALLS: Has patient fallen in last 6 months? No  LIVING ENVIRONMENT: Lives with: lives with their spouse Lives in: House/apartment Stairs: Yes: Internal: 10-14 steps; on right going up and External: 5 steps; on left going up Has following equipment at home: None  PLOF: Independent  PATIENT GOALS: I would like  to be more stable and stand on 1 foot and have better balance. I want to learn ways to regain balance  OBJECTIVE:   DIAGNOSTIC FINDINGS: none recent  COGNITION: Overall cognitive status: Within functional limits for tasks assessed   SENSATION: WFL- in all extremities  COORDINATION: Intact with heel to shin BLE  EDEMA:  None observed  LOWER EXTREMITY MMT:    MMT Right Eval Left Eval  Hip flexion 4 4  Hip extension 4 4  Hip abduction 4 4  Hip adduction 4 4  Hip internal rotation 4 4  Hip external rotation 4 4  Knee flexion 4 4  Knee extension 4 4  Ankle dorsiflexion 4 4  Ankle plantarflexion    Ankle inversion 4 4  Ankle eversion 4 4  (Blank rows = not tested)   TRANSFERS: Assistive device utilized: None  Sit to stand: Complete Independence Stand to sit: Complete Independence Chair to chair: Complete Independence Floor:  not tested   GAIT: Gait pattern: decreased arm swing- Right, decreased arm swing- Left, decreased step length- Right, and decreased step length- Left Distance walked: 100+ Assistive device utilized: None Level of assistance: Complete Independence  FUNCTIONAL TESTS:  5 times sit to stand: 16.01 sec without UE support Timed up and go (TUG): 10.91 sec without UE support 10 meter walk test: 1.0 m/s Berg Balance Scale: 50/56  PATIENT SURVEYS:  FOTO 58 with goal of 81  TODAY'S TREATMENT: DATE: 01/14/23  TE  Nustep level 2-5 x 5 min with cues to keep SPM >50 through varied resistance.   Seated heal raise with 15# kettle bells on knees 3x20  PT applied 5# ankle weights:   Standing hip abduction x 12 with 3 sec hold  Standing hip extension x 12 with 3 sec hold  LAQ x 15 with 3 sec hold Seated calf press, knees straight with GTB x 15  Standing SLS with 1 LE at 90 deg hip flexion with intermittent UE 2 x 10 sec bil   Modified chair squat with UE support on rail x 15   Weighted gait with 5# ankle weights x 334ft with cues for improved step length by improving stance time on stance LE to increase toe off and gluteal activation     PATIENT EDUCATION: Education details: Pt educated throughout session about proper posture and technique with exercises.   Person educated: Patient Education method: Explanation Education comprehension: verbalized understanding  HOME EXERCISE PROGRAM: Access Code: [email or web address] URL: [email or web address] Date: 11/22/2022 Prepared by: Grier Rocher  Exercises - Standing Forward Step Taps with Counter Support  - 1 x daily - 7 x weekly - 3 sets - 12 reps - Romberg Stance with Eyes Closed  - 1 x daily - 7 x weekly - 3 sets - 3 reps - 20 hold - Romberg Stance with Head Nods  - 1 x daily - 7 x weekly - 3 sets - 10 reps - Romberg Stance with Head Rotation  - 1 x daily - 7 x weekly - 3 sets - 10 reps - Seated Hip Abduction with Resistance  - 1 x daily - 7 x weekly - 3 sets - 10 reps - 3 hold - Sitting Knee Extension with Resistance  - 1 x daily - 7 x weekly - 3 sets - 10 reps - 3 hold - Seated March with Resistance  - 1 x daily - 7 x weekly - 3 sets - 10 reps - 2 hold - Standing Tandem Balance with Counter Support  -  1 x daily - 7 x weekly - 4 sets - 4 reps - 20 hold  Access Code: [email or web address] URL: [email or web address] Date: 10/29/2022 Prepared by: Maureen Ralphs  Exercises - Single Leg Stance  - 3 x weekly - 5-10 sets - as long as possible-  hold - Tandem Stance  - 3 x weekly - 3 sets - as long as possible hold   GOALS: Goals reviewed with patient? Yes  SHORT TERM GOALS: Target date:  12/10/2022  Patient will be independent with HEP in order to improve strength and balance in order to decrease fall risk and improve function at home and work.  Baseline: EVAL- No formal HEP in place Goal status: INITIAL  LONG TERM GOALS: Target date: 01/21/2023  1.  Patient (> 39 years old) will complete five times sit to stand test in < 15 seconds indicating an increased LE strength and improved balance. Baseline: 16.01 at eval 12/17/22: 9.88 sec Goal status: MET  2.  Patient will increase FOTO score to equal to or greater than  59   to demonstrate statistically significant improvement in mobility and quality of life.  Baseline: EVAL= 58 12/17/22: 63 Goal status: MET   3.  Patient will increase Berg Balance score by > 3 points to demonstrate decreased fall risk during functional activities. Baseline: EVAL=50/56 12/17/22: 52/56 Goal status: PROGRESSING  4.  Patient will demonstrate improved dynamic standing balance as seen by single leg standing > 10 sec each LE Baseline: EVAL=2-3 sec each LE 12/17/22: 3 sec on L LE, 2 sec on R LE Goal status: INITIAL  ASSESSMENT:  CLINICAL IMPRESSION:  Pt arrived motivated to participate. PT treatment focused on improved BLE strengthening to allow improved use of ankle strategy to correct LOB. Pt noted to have decreased strength/ROM in the R LE as well as increased difficulty with SLS on the RLE.  Pt will continue to benefit from skilled therapy to address remaining deficits in order to improve overall QoL and return to PLOF.     OBJECTIVE IMPAIRMENTS: decreased balance, decreased coordination, difficulty walking, decreased strength, and postural dysfunction.   ACTIVITY LIMITATIONS: carrying, lifting, bending, standing, and squatting  PARTICIPATION LIMITATIONS: community activity and yard work  PERSONAL FACTORS: 1-2 comorbidities: HTN; DM  are also affecting patient's functional outcome.   REHAB POTENTIAL: Good  CLINICAL DECISION MAKING:  Stable/uncomplicated  EVALUATION COMPLEXITY: Low  PLAN:  PT FREQUENCY: 1-2x/week  PT DURATION: 12 weeks  PLANNED INTERVENTIONS: Therapeutic exercises, Therapeutic activity, Neuromuscular re-education, Balance training, Gait training, Patient/Family education, Self Care, Joint mobilization, Joint manipulation, Stair training, Vestibular training, Canalith repositioning, DME instructions, Dry Needling, Electrical stimulation, Spinal manipulation, Spinal mobilization, Cryotherapy, Moist heat, Taping, Manual therapy, and Re-evaluation  PLAN FOR NEXT SESSION:   Dynamic and balance with emphasis on single limb control.  Core stabilization/strengthening.   Ankle strengthening.    Golden Pop PT ,DPT Physical Therapist- [place]  [facility]   8:17 AM 01/14/23

## 2023-01-15 ENCOUNTER — Other Ambulatory Visit: Payer: Self-pay | Admitting: Physician Assistant

## 2023-01-15 ENCOUNTER — Ambulatory Visit: Payer: Medicare Other | Admitting: Physician Assistant

## 2023-01-15 ENCOUNTER — Ambulatory Visit
Admission: RE | Admit: 2023-01-15 | Discharge: 2023-01-15 | Disposition: A | Discharge status: Home / Self Care | Payer: Medicare Other | Source: Ambulatory Visit | Attending: Thoracic Surgery (Cardiothoracic Vascular Surgery) | Admitting: Thoracic Surgery (Cardiothoracic Vascular Surgery)

## 2023-01-15 VITALS — BP 174/92 | HR 73 | Resp 18 | Ht 71.0 in | Wt 212.0 lb

## 2023-01-15 DIAGNOSIS — I712 Thoracic aortic aneurysm, without rupture, unspecified: Secondary | ICD-10-CM | POA: Diagnosis not present

## 2023-01-15 DIAGNOSIS — I7121 Aneurysm of the ascending aorta, without rupture: Secondary | ICD-10-CM

## 2023-01-15 MED ORDER — IOPAMIDOL (ISOVUE-370) INJECTION 76%
100.0000 mL | Freq: Once | INTRAVENOUS | Status: AC | PRN
  Administered 2023-01-15: 60 mL via INTRAVENOUS

## 2023-01-15 MED ORDER — LOSARTAN POTASSIUM 100 MG PO TABS: 100.0000 mg | ORAL_TABLET | Freq: Every day | ORAL | 1 refills | Status: DC

## 2023-01-15 NOTE — Patient Instructions (Addendum)
Risk Modification in those with ascending thoracic aortic aneurysm:  Continue good control of blood pressure (prefer SBP 130/80 or less)  2. Avoid fluoroquinolone antibiotics (I.e Ciprofloxacin, Avelox, Levofloxacin, Ofloxacin)  3.  Use of statin (to decrease cardiovascular risk)-continue Lipitor  4.  Exercise and activity limitations is individualized, but in general, contact sports are to be avoided and one should avoid heavy lifting (defined as half of ideal body weight) and exercises involving sustained Valsalva maneuver.  5. Counseling for those suspected of having genetically mediated disease. First-degree relatives of those with TAA disease should be screened as well as those who have a connective tissue disease (I.e with Marfan syndrome, Ehlers-Danlos syndrome, and Loeys-Dietz syndrome) or a  bicuspid aortic valve,have an increased risk for  complications related to TAA. Patient's father had an ATAA.   6.He does not have a tobacco abuse history as he never has smoked

## 2023-01-15 NOTE — Progress Notes (Unsigned)
[address]       [place] [address]             [phone]    PCP is Lauro Regulus, MD Referring Provider is Lauro Regulus, MD  Chief Complaint  Patient presents with   Thoracic Aortic Aneurysm    CTA chest today    HPI: This is a 77 year old male with a past medical history of hypertension, hyperlipidemia, obesity, diabetes mellitus, granulomatous lung nodules who presents today for further surveillance of his ascending thoracic aortic aneurysm. He was last seen in the office by Dr. Dorris Fetch 07/03/2022. At that time, the ATAA was stable at 4.6 cm. Patient denies chest pain, pressure, or tightness, or LE edema.  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 Moderate to marked atypia, edge involved. Excised 09/12/2006, margins free.   Dysplastic nevus 11/19/2007   Left costal margin/infrapectoral. Slight to moderate atypia, margin focally involved.   Dysplastic nevus 11/19/2007   Right lateral flank near waistline. Moderate atypia, extends to one edge.    Dysplastic nevus 05/25/2019   Left lateral tricep. Moderate atypia, deep margin involved.    GERD (gastroesophageal reflux disease)    Hepatic steatosis    Hyperlipidemia    Hypertension    Intermittent alternating exotropia    Neurogenic bladder    a.) self catheriterizes FOUR times daily   Osteoarthritis    Serrated adenoma of colon 07/17/2016   Sliding hiatal hernia    T2DM (type 2 diabetes mellitus) (HCC)     Past  Surgical History:  Procedure Laterality Date   CATARACT EXTRACTION Left 05/15/2018   CATARACT EXTRACTION Right 04/03/2018   CHOLECYSTECTOMY     COLONOSCOPY WITH PROPOFOL N/A 07/17/2016   Procedure: COLONOSCOPY WITH PROPOFOL;  Surgeon: Christena Deem, MD;  Location: [facility] ENDOSCOPY;  Service: Endoscopy;  Laterality: N/A;   COLONOSCOPY WITH PROPOFOL N/A 02/04/2018   Procedure: COLONOSCOPY WITH PROPOFOL;  Surgeon: Christena Deem, MD;  Location: [facility] ENDOSCOPY;  Service: Endoscopy;  Laterality: N/A;   EYE SURGERY Left 2000   Strabismus correction   FOREIGN BODY REMOVAL Left 10/23/2019   Procedure: REMOVAL FOREIGN BODY LEFT HEEL;  Surgeon: Gwyneth Revels, DPM;  Location: [facility] ORS;  Service: Podiatry;  Laterality: Left;   GASTRIC RESTRICTION SURGERY N/A 1982   "stomach stapled for weight loss"   LUMBAR FUSION N/A 02/10/2020   Procedure: PLIF,IP,PSI L2- L3;EXPL FUSION; Location: [facility]; Surgeon: Tressie Stalker, MD   POSTERIOR LUMBAR FUSION N/A 05/04/2015   Procedure: LUMBAR THREE-FOUR, LUMBAR FOUR-FIVE POSTERIOR LUMBAR FUSION; Location: [facility]; Surgeon: Tressie Stalker, MD   TONSILLECTOMY Bilateral 1954   TRANSANAL EXCISION OF RECTAL MASS N/A 09/18/2016   Procedure: TRANSANAL EXCISION OF RECTAL TUMOR;  Surgeon: Kieth Brightly, MD;  Location: [facility] ORS;  Service: General;  Laterality: N/A;   ULNAR COLLATERAL LIGAMENT REPAIR Left 12/28/2020   Procedure: LEFT THUMB ULNAR COLLATERAL LIGAMENT REPAIR/RECONSTRUCTION;  Surgeon:  Poggi, Excell Seltzer, MD;  Location: [facility] ORS;  Service: Orthopedics;  Laterality: Left;    Family History  Problem Relation Age of Onset   Colon cancer Neg Hx    Breast cancer Neg Hx     Social History Social History   Tobacco Use   Smoking status: Never   Smokeless tobacco: Never  Vaping Use   Vaping status: Never Used  Substance Use Topics   Alcohol use: Yes    Comment: 6 - 12 beers a week   Drug use: No    Current Outpatient  Medications  Medication Sig Dispense Refill   dorzolamide-timolol (COSOPT) 22.3-6.8 MG/ML ophthalmic solution Place 1 drop into the right eye 2 (two) times daily.     gabapentin (NEURONTIN) 400 MG capsule Take 1,200 mg by mouth at bedtime.     LEVEMIR FLEXTOUCH 100 UNIT/ML Pen Inject 10 Units into the skin in the morning.  11   losartan (COZAAR) 50 MG tablet Take 50 mg by mouth daily.     metFORMIN (GLUCOPHAGE-XR) 500 MG 24 hr tablet Take 1,000 mg by mouth 2 (two) times daily.      Multiple Vitamin (MULTIVITAMIN WITH MINERALS) TABS tablet Take 1 tablet by mouth in the morning. Multivitamin for Seniors     omeprazole (PRILOSEC) 20 MG capsule Take 20 mg by mouth in the morning.     oxymetazoline (AFRIN) 0.05 % nasal spray Place 1 spray into both nostrils in the morning.     OZEMPIC, 2 MG/DOSE, 8 MG/3ML SOPN Inject into the skin.     tamsulosin (FLOMAX) 0.4 MG CAPS capsule Take 1 capsule (0.4 mg total) by mouth daily. (Patient taking differently: Take 0.4 mg by mouth every evening.) 30 capsule 1   atorvastatin (LIPITOR) 10 MG tablet Take 10 mg by mouth in the morning.    Allergies: No Known Allergies  Vital Signs: BP (!) 174/92 (BP Location: Left Arm, Patient Position: Sitting)   Pulse 73   Resp 18   Ht 5\' 11"  (1.803 m)   Wt 212 lb (96.2 kg)   SpO2 98% Comment: RA  BMI 29.57 kg/m   Physical Exam: CV-RRR, no murmur Pulmonary-Clear to auscultation bilaterally Abdomen-Soft, non tender, bowel sounds present Extremities-No LE edema. Varicosity on LLE  Neurologic-Grossly intact without focal deficit  Diagnostic Tests:   Impression and Plan: Losartan was increased to 100 mg daily is he is hypertensive. Of note, he had another appointment earlier today and his SBP was 160.  *** with a *** cm ascending aortic aneurysm.  We discussed the natural history and and risk factors for growth of ascending aortic aneurysms.  We covered the importance of smoking cessation, tight blood pressure  control, refraining from lifting heavy objects, and avoiding fluoroquinolones.  The patient is aware of signs and symptoms of aortic dissection and when to present to the emergency department.  We will continue surveillance and a repeat CTA was ordered for 6 months.  Ardelle Balls, PA-C [facility] [phone]

## 2023-01-16 ENCOUNTER — Ambulatory Visit: Payer: Medicare Other | Admitting: Physical Therapy

## 2023-01-16 ENCOUNTER — Other Ambulatory Visit: Payer: Self-pay | Admitting: Physician Assistant

## 2023-01-21 ENCOUNTER — Ambulatory Visit: Payer: Medicare Other | Admitting: Physical Therapy

## 2023-01-23 ENCOUNTER — Ambulatory Visit: Payer: Medicare Other | Admitting: Physical Therapy

## 2023-01-28 ENCOUNTER — Ambulatory Visit: Payer: Medicare Other | Admitting: Physical Therapy

## 2023-01-28 DIAGNOSIS — M6281 Muscle weakness (generalized): Secondary | ICD-10-CM

## 2023-01-28 DIAGNOSIS — R262 Difficulty in walking, not elsewhere classified: Secondary | ICD-10-CM

## 2023-01-28 DIAGNOSIS — R269 Unspecified abnormalities of gait and mobility: Secondary | ICD-10-CM

## 2023-01-28 DIAGNOSIS — R2681 Unsteadiness on feet: Secondary | ICD-10-CM

## 2023-01-28 NOTE — Therapy (Signed)
OUTPATIENT PHYSICAL THERAPY NEURO TREATMENT/ RECERT   Patient Name: Calvin Roth MRN: [id number] DOB:1946-03-14, 77 y.o., male Today's Date: 01/28/2023   PCP: Dr. Einar Crow REFERRING PROVIDER: Dr. Einar Crow  END OF SESSION:  PT End of Session - 01/28/23 0947     Visit Number 16    Number of Visits 28    Date for PT Re-Evaluation 03/11/23    Progress Note Due on Visit 20    PT Start Time 0803    PT Stop Time 0842    PT Time Calculation (min) 39 min    Equipment Utilized During Treatment Gait belt    Activity Tolerance Patient tolerated treatment well    Behavior During Therapy [facility] for tasks assessed/performed                 Past Medical History:  Diagnosis Date   Actinic keratosis    Aortic atherosclerosis (HCC)    Aortic root dilatation (HCC) 03/12/2019   a.) CTA 03/22/2019 --> 4.5 cm   Ascending aortic aneurysm (HCC) 10/31/2017   a.) fusiform; measured 4.4 cm   BPH (benign prostatic hyperplasia)    CKD (chronic kidney disease), stage III (HCC)    Coronary artery disease    DDD (degenerative disc disease), lumbar    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 Moderate to marked atypia, edge involved. Excised 09/12/2006, margins free.   Dysplastic nevus 11/19/2007   Left costal margin/infrapectoral. Slight to moderate atypia, margin focally involved.   Dysplastic nevus 11/19/2007   Right lateral flank near waistline. Moderate atypia, extends to one edge.    Dysplastic nevus 05/25/2019   Left lateral tricep. Moderate atypia, deep margin involved.    GERD (gastroesophageal reflux disease)    Hepatic steatosis    Hyperlipidemia    Hypertension    Intermittent alternating exotropia    Neurogenic bladder    a.) self catheriterizes FOUR times daily   Osteoarthritis    Serrated adenoma of colon 07/17/2016   Sliding hiatal hernia    T2DM (type 2 diabetes mellitus) (HCC)    Past  Surgical History:  Procedure Laterality Date   CATARACT EXTRACTION Left 05/15/2018   CATARACT EXTRACTION Right 04/03/2018   CHOLECYSTECTOMY     COLONOSCOPY WITH PROPOFOL N/A 07/17/2016   Procedure: COLONOSCOPY WITH PROPOFOL;  Surgeon: Christena Deem, MD;  Location: [facility] ENDOSCOPY;  Service: Endoscopy;  Laterality: N/A;   COLONOSCOPY WITH PROPOFOL N/A 02/04/2018   Procedure: COLONOSCOPY WITH PROPOFOL;  Surgeon: Christena Deem, MD;  Location: [facility] ENDOSCOPY;  Service: Endoscopy;  Laterality: N/A;   EYE SURGERY Left 2000   Strabismus correction   FOREIGN BODY REMOVAL Left 10/23/2019   Procedure: REMOVAL FOREIGN BODY LEFT HEEL;  Surgeon: Gwyneth Revels, DPM;  Location: [facility] ORS;  Service: Podiatry;  Laterality: Left;   GASTRIC RESTRICTION SURGERY N/A 1982   "stomach stapled for weight loss"   LUMBAR FUSION N/A 02/10/2020   Procedure: PLIF,IP,PSI L2- L3;EXPL FUSION; Location: [facility]; Surgeon: Tressie Stalker, MD   POSTERIOR LUMBAR FUSION N/A 05/04/2015   Procedure: LUMBAR THREE-FOUR, LUMBAR FOUR-FIVE POSTERIOR LUMBAR FUSION; Location: [facility]; Surgeon: Tressie Stalker, MD   TONSILLECTOMY Bilateral 1954   TRANSANAL EXCISION OF RECTAL MASS N/A 09/18/2016   Procedure: TRANSANAL EXCISION OF RECTAL TUMOR;  Surgeon: Kieth Brightly, MD;  Location: [facility] ORS;  Service: General;  Laterality: N/A;   ULNAR COLLATERAL LIGAMENT REPAIR Left 12/28/2020  Procedure: LEFT THUMB ULNAR COLLATERAL LIGAMENT REPAIR/RECONSTRUCTION;  Surgeon: Christena Flake, MD;  Location: [facility] ORS;  Service: Orthopedics;  Laterality: Left;   Patient Active Problem List   Diagnosis Date Noted   Spondylolisthesis of lumbar region 05/04/2015   Degeneration of intervertebral disc of lumbar region 10/19/2014   Neuritis or radiculitis due to rupture of lumbar intervertebral disc 10/19/2014   Lumbar canal stenosis 10/19/2014   Morbid obesity (HCC) 01/23/2014   Benign prostatic hyperplasia with  urinary obstruction 10/01/2013   Type 2 diabetes mellitus (HCC) 10/01/2013   Benign hypertension 10/01/2013   Spinal stenosis 10/01/2013    ONSET DATE: around July 2023  REFERRING DIAG: R27.0 (ICD-10-CM) - Ataxia, unspecified   THERAPY DIAG:  Unsteadiness on feet  Abnormality of gait and mobility  Difficulty in walking, not elsewhere classified  Muscle weakness (generalized)  Rationale for Evaluation and Treatment: Rehabilitation  SUBJECTIVE:                                                                                                                                                                                             SUBJECTIVE STATEMENT:  Pt reports that he is doing well. States that he has been slacking some with the HEP and has been on vacation ( glamping) for the past couple weeks.   Pt accompanied by: self  PERTINENT HISTORY: Past Medical History:  Diagnosis Date  Diabetes mellitus type 2, uncomplicated (CMS/HHS-HCC)  Hyperlipidemia  Hypertension  Serrated adenoma of colon 07/17/2016   Past Surgical History:  Procedure Laterality Date  COLONOSCOPY 07/17/2016  Serrated adenoma/Repeat 70yr/MUS  COLONOSCOPY 02/04/2018  Tubular adenoma of the colon/Hyperplastic colon polyp/Repeat 70yrs/MUS  CATARACT EXTRACTION W/ INTRAOCULAR LENS IMPLANT Right 04/2018  LENS EYE SURGERY Right 04/03/2018  standard monofocal  CATARACT EXTRACTION Left 05/2018  LENS EYE SURGERY Left 05/15/2018  back surgery 02/10/2020  fused L2 and L3  Primary repair of ulnar collateral ligament with internal brace augmentation, left thumb Left 12/28/2020  Dr.Poggi  COLONOSCOPY 01/25/2022  PHxCP/normal colon/no repeat/CTL  CHOLECYSTECTOMY  COLONOSCOPY 04/15/2004, 07/13/2009  Correction of strabismus, left eye  EXPLORATION OF SPINAL FUSION  spinal fusion 05/2015  Gastric stapling  TONSILLECTOMY    PAIN:  Are you having pain? No  PRECAUTIONS: Fall  RED FLAGS: None   WEIGHT  BEARING RESTRICTIONS: No  FALLS: Has patient fallen in last 6 months? No  LIVING ENVIRONMENT: Lives with: lives with their spouse Lives in: House/apartment Stairs: Yes: Internal: 10-14 steps; on right going up and External: 5 steps; on left going up Has following equipment at home: None  PLOF: Independent  PATIENT GOALS: I would like  to be more stable and stand on 1 foot and have better balance. I want to learn ways to regain balance  OBJECTIVE:   DIAGNOSTIC FINDINGS: none recent  COGNITION: Overall cognitive status: Within functional limits for tasks assessed   SENSATION: WFL- in all extremities  COORDINATION: Intact with heel to shin BLE  EDEMA:  None observed  LOWER EXTREMITY MMT:    MMT Right Eval Left Eval  Hip flexion 4 4  Hip extension 4 4  Hip abduction 4 4  Hip adduction 4 4  Hip internal rotation 4 4  Hip external rotation 4 4  Knee flexion 4 4  Knee extension 4 4  Ankle dorsiflexion 4 4  Ankle plantarflexion    Ankle inversion 4 4  Ankle eversion 4 4  (Blank rows = not tested)   TRANSFERS: Assistive device utilized: None  Sit to stand: Complete Independence Stand to sit: Complete Independence Chair to chair: Complete Independence Floor:  not tested   GAIT: Gait pattern: decreased arm swing- Right, decreased arm swing- Left, decreased step length- Right, and decreased step length- Left Distance walked: 100+ Assistive device utilized: None Level of assistance: Complete Independence  FUNCTIONAL TESTS:  5 times sit to stand: 16.01 sec without UE support Timed up and go (TUG): 10.91 sec without UE support 10 meter walk test: 1.0 m/s Berg Balance Scale: 50/56  PATIENT SURVEYS:  FOTO 58 with goal of 33  TODAY'S TREATMENT: DATE: 01/28/23  Physical therapy treatment session today consisted of completing assessment of goals and administration of testing as demonstrated and documented in flow sheet, treatment, and goals section of this note.  Addition treatments may be found below.    [facility] PT Assessment - 01/28/23 0001       Berg Balance Test   Sit to Stand Able to stand without using hands and stabilize independently    Standing Unsupported Able to stand safely 2 minutes    Sitting with Back Unsupported but Feet Supported on Floor or Stool Able to sit safely and securely 2 minutes    Stand to Sit Sits safely with minimal use of hands    Transfers Able to transfer safely, minor use of hands    Standing Unsupported with Eyes Closed Able to stand 10 seconds safely    Standing Unsupported with Feet Together Able to place feet together independently and stand 1 minute safely    From Standing, Reach Forward with Outstretched Arm Can reach confidently >25 cm (10")    From Standing Position, Pick up Object from Floor Able to pick up shoe safely and easily    From Standing Position, Turn to Look Behind Over each Shoulder Looks behind from both sides and weight shifts well    Turn 360 Degrees Able to turn 360 degrees safely in 4 seconds or less    Standing Unsupported, Alternately Place Feet on Step/Stool Able to stand independently and safely and complete 8 steps in 20 seconds    Standing Unsupported, One Foot in Front Able to place foot tandem independently and hold 30 seconds    Standing on One Leg Able to lift leg independently and hold equal to or more than 3 seconds    Total Score 54              TE Lateral stepping on airex beam x 10 reps ea direction  SLS progression with 1 LE on airex and one on step x 30 sec ea side   Step taps on airex  x 10 on ea side     PATIENT EDUCATION: Education details: Pt educated throughout session about proper posture and technique with exercises.   Person educated: Patient Education method: Explanation Education comprehension: verbalized understanding  HOME EXERCISE PROGRAM: Access Code: [email or web address] URL: [email or web address] Date: 11/22/2022 Prepared by: Grier Rocher  Exercises - Standing Forward Step Taps with Counter Support  - 1 x daily - 7 x weekly - 3 sets - 12 reps - Romberg Stance with Eyes Closed  - 1 x daily - 7 x weekly - 3 sets - 3 reps - 20 hold - Romberg Stance with Head Nods  - 1 x daily - 7 x weekly - 3 sets - 10 reps - Romberg Stance with Head Rotation  - 1 x daily - 7 x weekly - 3 sets - 10 reps - Seated Hip Abduction with Resistance  - 1 x daily - 7 x weekly - 3 sets - 10 reps - 3 hold - Sitting Knee Extension with Resistance  - 1 x daily - 7 x weekly - 3 sets - 10 reps - 3 hold - Seated March with Resistance  - 1 x daily - 7 x weekly - 3 sets - 10 reps - 2 hold - Standing Tandem Balance with Counter Support  - 1 x daily - 7 x weekly - 4 sets - 4 reps - 20 hold  Access Code: [email or web address] URL: [email or web address] Date: 10/29/2022 Prepared by: Maureen Ralphs  Exercises - Single Leg Stance  - 3 x weekly - 5-10 sets - as long as possible-  hold - Tandem Stance  - 3 x weekly - 3 sets - as long as possible hold   GOALS: Goals reviewed with patient? Yes  SHORT TERM GOALS: Target date: 02/18/2023   Patient will be independent with HEP in order to improve strength and balance in order to decrease fall risk and improve function at home and work.  Baseline: EVAL- No formal HEP in place  01/28/23: 1 x per week  Goal status: ONGOING  LONG TERM GOALS: Target date: 03/11/2023    1.  Patient (> 9 years old) will complete five times sit to stand test in < 15 seconds indicating an increased LE strength and improved balance. Baseline: 16.01 at eval 12/17/22: 9.88 sec Goal status: MET  2.  Patient will increase FOTO score to equal to or greater than  59   to demonstrate statistically significant improvement in mobility and quality of life.  Baseline: EVAL= 58 12/17/22: 63 Goal status: MET   3.  Patient will increase Berg Balance score by > 3 points to demonstrate decreased fall risk during functional  activities. Baseline: EVAL=50/56 12/17/22: 52/56 10/28:54 Goal status: MET  4.  Patient will demonstrate improved dynamic standing balance as seen by single leg standing > 10 sec each LE Baseline: EVAL=2-3 sec each LE 12/17/22: 3 sec on L LE, 2 sec on R LE 10/28:5 sec on L 2.5 sec on R Goal status: INITIAL  5.  Pt will improve mini BEST balance score by 4 points or greater to show a clinically significant improvement in his overall balance and risk for falls.  Baseline: test next visit  Goal status: INITIAL   ASSESSMENT:  CLINICAL IMPRESSION:  Pt arrived motivated to participate. Pt presents for recert note. Pt shows progress with his goals and balance AEB improved BERG balance score to meet this goal. Pt still limited with high level balance activities such as SLS and is still  progressing toward this goal. Mini Best balance test to be completed next session to further evaluate higher level balance efficacy with challenged of vestibular, proprioceptive, and dynamic balance for more full scope measurement of pt balance deficits. Patient's condition has the potential to improve in response to therapy. Maximum improvement is yet to be obtained. The anticipated improvement is attainable and reasonable in a generally predictable time. Pt will continue to benefit from skilled physical therapy intervention to address impairments, improve QOL, and attain therapy goals.     OBJECTIVE IMPAIRMENTS: decreased balance, decreased coordination, difficulty walking, decreased strength, and postural dysfunction.   ACTIVITY LIMITATIONS: carrying, lifting, bending, standing, and squatting  PARTICIPATION LIMITATIONS: community activity and yard work  PERSONAL FACTORS: 1-2 comorbidities: HTN; DM  are also affecting patient's functional outcome.   REHAB POTENTIAL: Good  CLINICAL DECISION MAKING: Stable/uncomplicated  EVALUATION COMPLEXITY: Low  PLAN:  PT FREQUENCY: 1-2x/week  PT DURATION: 12  weeks  PLANNED INTERVENTIONS: Therapeutic exercises, Therapeutic activity, Neuromuscular re-education, Balance training, Gait training, Patient/Family education, Self Care, Joint mobilization, Joint manipulation, Stair training, Vestibular training, Canalith repositioning, DME instructions, Dry Needling, Electrical stimulation, Spinal manipulation, Spinal mobilization, Cryotherapy, Moist heat, Taping, Manual therapy, and Re-evaluation  PLAN FOR NEXT SESSION:   Mini best next session and record result in goals  Pt instructed if not compliant with HEP over next 4 weeks from 10/28 ( during month of November) then discharge would be imminent.    Norman Herrlich PT ,DPT Physical Therapist- [place]  [facility]   9:48 AM 01/28/23

## 2023-01-30 ENCOUNTER — Ambulatory Visit: Payer: Medicare Other | Admitting: Physical Therapy

## 2023-01-31 ENCOUNTER — Ambulatory Visit: Payer: Medicare Other | Admitting: Physical Therapy

## 2023-02-04 ENCOUNTER — Ambulatory Visit: Payer: Medicare Other | Admitting: Physical Therapy

## 2023-02-06 ENCOUNTER — Ambulatory Visit: Payer: Medicare Other | Admitting: Physical Therapy

## 2023-02-11 ENCOUNTER — Ambulatory Visit: Payer: Medicare Other | Admitting: Physical Therapy

## 2023-02-13 ENCOUNTER — Ambulatory Visit: Payer: Medicare Other | Admitting: Physical Therapy

## 2023-02-18 ENCOUNTER — Ambulatory Visit: Payer: Medicare Other | Admitting: Physical Therapy

## 2023-02-20 ENCOUNTER — Ambulatory Visit: Payer: Medicare Other | Admitting: Physical Therapy

## 2023-02-25 ENCOUNTER — Ambulatory Visit: Payer: Medicare Other | Admitting: Physical Therapy

## 2023-02-27 ENCOUNTER — Ambulatory Visit: Payer: Medicare Other

## 2023-03-04 ENCOUNTER — Ambulatory Visit: Payer: Medicare Other | Admitting: Physical Therapy

## 2023-03-06 ENCOUNTER — Ambulatory Visit: Payer: Medicare Other | Admitting: Physical Therapy

## 2023-03-11 ENCOUNTER — Ambulatory Visit: Payer: Medicare Other | Admitting: Physical Therapy

## 2023-03-13 ENCOUNTER — Ambulatory Visit: Payer: Medicare Other | Admitting: Physical Therapy

## 2023-03-18 ENCOUNTER — Ambulatory Visit: Payer: Medicare Other | Admitting: Physical Therapy

## 2023-03-20 ENCOUNTER — Ambulatory Visit: Payer: Medicare Other | Admitting: Physical Therapy

## 2023-03-25 ENCOUNTER — Ambulatory Visit: Payer: Medicare Other | Admitting: Physical Therapy

## 2023-06-07 ENCOUNTER — Other Ambulatory Visit: Payer: Self-pay | Admitting: Thoracic Surgery (Cardiothoracic Vascular Surgery)

## 2023-06-07 DIAGNOSIS — I712 Thoracic aortic aneurysm, without rupture, unspecified: Secondary | ICD-10-CM

## 2023-06-13 ENCOUNTER — Encounter: Payer: Self-pay | Admitting: Dermatology

## 2023-06-13 ENCOUNTER — Ambulatory Visit: Payer: Medicare Other | Admitting: Dermatology

## 2023-06-13 DIAGNOSIS — Z1283 Encounter for screening for malignant neoplasm of skin: Secondary | ICD-10-CM | POA: Diagnosis not present

## 2023-06-13 DIAGNOSIS — W908XXA Exposure to other nonionizing radiation, initial encounter: Secondary | ICD-10-CM

## 2023-06-13 DIAGNOSIS — D229 Melanocytic nevi, unspecified: Secondary | ICD-10-CM

## 2023-06-13 DIAGNOSIS — D1801 Hemangioma of skin and subcutaneous tissue: Secondary | ICD-10-CM

## 2023-06-13 DIAGNOSIS — L729 Follicular cyst of the skin and subcutaneous tissue, unspecified: Secondary | ICD-10-CM

## 2023-06-13 DIAGNOSIS — L905 Scar conditions and fibrosis of skin: Secondary | ICD-10-CM

## 2023-06-13 DIAGNOSIS — L570 Actinic keratosis: Secondary | ICD-10-CM

## 2023-06-13 DIAGNOSIS — L720 Epidermal cyst: Secondary | ICD-10-CM

## 2023-06-13 DIAGNOSIS — Z86018 Personal history of other benign neoplasm: Secondary | ICD-10-CM

## 2023-06-13 DIAGNOSIS — L814 Other melanin hyperpigmentation: Secondary | ICD-10-CM

## 2023-06-13 DIAGNOSIS — L821 Other seborrheic keratosis: Secondary | ICD-10-CM

## 2023-06-13 DIAGNOSIS — L578 Other skin changes due to chronic exposure to nonionizing radiation: Secondary | ICD-10-CM | POA: Diagnosis not present

## 2023-06-13 DIAGNOSIS — L853 Xerosis cutis: Secondary | ICD-10-CM

## 2023-06-13 NOTE — Patient Instructions (Addendum)
 Recommend AmLactin Rapid Relief or Eucerin Roughness Relief with Urea for dryness at feet.  Cryotherapy Aftercare  Wash gently with soap and water everyday.   Apply Vaseline and Band-Aid daily until healed.    Melanoma ABCDEs  Melanoma is the most dangerous type of skin cancer, and is the leading cause of death from skin disease.  You are more likely to develop melanoma if you: Have light-colored skin, light-colored eyes, or red or blond hair Spend a lot of time in the sun Tan regularly, either outdoors or in a tanning bed Have had blistering sunburns, especially during childhood Have a close family member who has had a melanoma Have atypical moles or large birthmarks  Early detection of melanoma is key since treatment is typically straightforward and cure rates are extremely high if we catch it early.   The first sign of melanoma is often a change in a mole or a new dark spot.  The ABCDE system is a way of remembering the signs of melanoma.  A for asymmetry:  The two halves do not match. B for border:  The edges of the growth are irregular. C for color:  A mixture of colors are present instead of an even brown color. D for diameter:  Melanomas are usually (but not always) greater than 6mm - the size of a pencil eraser. E for evolution:  The spot keeps changing in size, shape, and color.  Please check your skin once per month between visits. You can use a small mirror in front and a large mirror behind you to keep an eye on the back side or your body.   If you see any new or changing lesions before your next follow-up, please call to schedule a visit.  Please continue daily skin protection including broad spectrum sunscreen SPF 30+ to sun-exposed areas, reapplying every 2 hours as needed when you're outdoors.    Due to recent changes in healthcare laws, you may see results of your pathology and/or laboratory studies on MyChart before the doctors have had a chance to review them. We  understand that in some cases there may be results that are confusing or concerning to you. Please understand that not all results are received at the same time and often the doctors may need to interpret multiple results in order to provide you with the best plan of care or course of treatment. Therefore, we ask that you please give [place] 2 business days to thoroughly review all your results before contacting the office for clarification. Should we see a critical lab result, you will be contacted sooner.   If You Need Anything After Your Visit  If you have any questions or concerns for your doctor, please call our main line at [phone] and press option 4 to reach your doctor's medical assistant. If no one answers, please leave a voicemail as directed and we will return your call as soon as possible. Messages left after 4 pm will be answered the following business day.   You may also send [place] a message via MyChart. We typically respond to MyChart messages within 1-2 business days.  For prescription refills, please ask your pharmacy to contact our office. Our fax number is [phone].  If you have an urgent issue when the clinic is closed that cannot wait until the next business day, you can page your doctor at the number below.    Please note that while we do our best to be available for urgent issues outside  of office hours, we are not available 24/7.   If you have an urgent issue and are unable to reach [place], you may choose to seek medical care at your doctor's office, retail clinic, urgent care center, or emergency room.  If you have a medical emergency, please immediately call 911 or go to the emergency department.  Pager Numbers  - Dr. Gwen Pounds: [phone]  - Dr. Roseanne Reno: [phone]  - Dr. Katrinka Blazing: [phone]   In the event of inclement weather, please call our main line at [phone] for an update on the status of any delays or closures.  Dermatology Medication Tips: Please  keep the boxes that topical medications come in in order to help keep track of the instructions about where and how to use these. Pharmacies typically print the medication instructions only on the boxes and not directly on the medication tubes.   If your medication is too expensive, please contact our office at [phone] option 4 or send [place] a message through MyChart.   We are unable to tell what your co-pay for medications will be in advance as this is different depending on your insurance coverage. However, we may be able to find a substitute medication at lower cost or fill out paperwork to get insurance to cover a needed medication.   If a prior authorization is required to get your medication covered by your insurance company, please allow [place] 1-2 business days to complete this process.  Drug prices often vary depending on where the prescription is filled and some pharmacies may offer cheaper prices.  The website www.goodrx.com contains coupons for medications through different pharmacies. The prices here do not account for what the cost may be with help from insurance (it may be cheaper with your insurance), but the website can give you the price if you did not use any insurance.  - You can print the associated coupon and take it with your prescription to the pharmacy.  - You may also stop by our office during regular business hours and pick up a GoodRx coupon card.  - If you need your prescription sent electronically to a different pharmacy, notify our office through [facility] or by phone at [phone] option 4.     Si Usted Necesita Algo Despus de Su Visita  Tambin puede enviarnos un mensaje a travs de Clinical cytogeneticist. Por lo general respondemos a los mensajes de MyChart en el transcurso de 1 a 2 das hbiles.  Para renovar recetas, por favor pida a su farmacia que se ponga en contacto con nuestra oficina. [name] de fax es [name] [phone].  Si tiene un asunto urgente  cuando la clnica est cerrada y que no puede esperar hasta el siguiente da hbil, puede llamar/localizar a su doctor(a) al nmero que aparece a continuacin.   Por favor, tenga en cuenta que aunque hacemos todo lo posible para estar disponibles para asuntos urgentes fuera del horario de [place], no estamos disponibles las 24 horas del da, los 7 [address]  [address] de la [place].   Si tiene un problema urgente y no puede comunicarse con nosotros, puede optar por buscar atencin mdica  en el consultorio de su doctor(a), en una clnica privada, en un centro de atencin urgente o en una sala de emergencias.  Si tiene Engineer, drilling, por favor llame inmediatamente al 911 o vaya a la sala de emergencias.  Nmeros de bper  - Dr. Gwen Pounds: [phone]  - Dra. Roseanne Reno: [phone]  - Dr. Katrinka Blazing: [phone]   En  caso de inclemencias del [place], por favor llame a nuestra lnea principal al [phone] para una actualizacin sobre el estado de cualquier retraso o cierre.  Consejos para la medicacin en dermatologa: Por favor, guarde las cajas en las que vienen los medicamentos de uso tpico para ayudarle a seguir las instrucciones sobre dnde y cmo usarlos. Las farmacias generalmente imprimen las instrucciones del medicamento slo en las cajas y no directamente en los tubos del [place].   Si su medicamento es muy caro, por favor, pngase en contacto con Rolm [place] llamando al [phone] y presione la opcin 4 o envenos un mensaje a travs de Clinical cytogeneticist.   No podemos decirle cul ser su copago por los medicamentos por adelantado ya que esto es diferente dependiendo de la cobertura de su seguro. Sin embargo, es posible que podamos encontrar un medicamento sustituto a Audiological scientist un formulario para que el seguro cubra el medicamento que se considera necesario.   Si se requiere una autorizacin previa para que su compaa de seguros [place] su medicamento, por favor permtanos de 1 a 2  das hbiles para completar [address]  Los precios de los medicamentos varan con frecuencia dependiendo del Environmental consultant de dnde se surte la receta y alguna farmacias pueden ofrecer precios ms baratos.  El sitio web www.goodrx.com tiene cupones para medicamentos de Health and safety inspector. Los precios aqu no tienen en cuenta lo que podra costar con la ayuda del seguro (puede ser ms barato con su seguro), pero el sitio web puede darle el precio si no utiliz Tourist information centre manager.  - Puede imprimir el cupn correspondiente y llevarlo con su receta a la farmacia.  - Tambin puede pasar por nuestra oficina durante el horario de atencin regular y Education officer, museum una tarjeta de cupones de GoodRx.  - Si necesita que su receta se enve electrnicamente a una farmacia diferente, informe a nuestra oficina a travs de MyChart de [place] o por telfono llamando al [phone] y presione la opcin 4.
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
## 2023-07-08 ENCOUNTER — Ambulatory Visit
Admission: RE | Admit: 2023-07-08 | Discharge: 2023-07-08 | Disposition: A | Discharge status: Home / Self Care | Source: Ambulatory Visit | Attending: Thoracic Surgery (Cardiothoracic Vascular Surgery) | Admitting: Thoracic Surgery (Cardiothoracic Vascular Surgery)

## 2023-07-08 DIAGNOSIS — I712 Thoracic aortic aneurysm, without rupture, unspecified: Secondary | ICD-10-CM

## 2023-07-08 MED ORDER — IOPAMIDOL (ISOVUE-370) INJECTION 76%
60.0000 mL | Freq: Once | INTRAVENOUS | Status: AC | PRN
  Administered 2023-07-08: 60 mL via INTRAVENOUS

## 2023-07-16 ENCOUNTER — Encounter: Payer: Self-pay | Admitting: Thoracic Surgery (Cardiothoracic Vascular Surgery)

## 2023-07-16 ENCOUNTER — Ambulatory Visit (INDEPENDENT_AMBULATORY_CARE_PROVIDER_SITE_OTHER): Admitting: Thoracic Surgery (Cardiothoracic Vascular Surgery)

## 2023-07-16 VITALS — BP 117/67 | HR 83 | Resp 20 | Ht 71.0 in | Wt 215.4 lb

## 2023-07-16 DIAGNOSIS — I7121 Aneurysm of the ascending aorta, without rupture: Secondary | ICD-10-CM | POA: Diagnosis not present

## 2023-07-16 DIAGNOSIS — I712 Thoracic aortic aneurysm, without rupture, unspecified: Secondary | ICD-10-CM | POA: Insufficient documentation

## 2023-07-16 NOTE — Progress Notes (Signed)
 [address]       [place] [address]             [phone]    HPI: Mr. Calvin Roth returns for scheduled follow-up regarding an ascending aneurysm.  Calvin Roth is a 78 year old man with a history of hypertension, hyperlipidemia, obesity, type II diabetes, neurogenic bladder, granulomatous lung nodules, ascending aneurysm, stage III chronic kidney disease, BPH, hiatal hernia, and arthritis.  Found to have a 4.5 cm ascending aneurysm several years ago on CT for coronary calcification.  At one point had a groundglass opacity in the right lower lobe that completely resolved.  He was last seen in the office in October 2024.  His aneurysm was stable.  In the interim since that visit he has continued to do well.  No chest pain, pressure, tightness, or shortness of breath.  No swelling in his legs.  He is lost about 40 pounds overall and is no longer on insulin.  He remains on Mounjaro.  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 Moderate to marked atypia, edge involved. Excised 09/12/2006, margins free.   Dysplastic nevus 11/19/2007   Left costal margin/infrapectoral. Slight to moderate atypia, margin focally involved.   Dysplastic nevus 11/19/2007   Right lateral flank near waistline. Moderate atypia, extends to one edge.    Dysplastic nevus 05/25/2019   Left lateral tricep. Moderate atypia, deep margin involved.    GERD (gastroesophageal reflux disease)    Hepatic steatosis    Hyperlipidemia     Hypertension    Intermittent alternating exotropia    Neurogenic bladder    a.) self catheriterizes FOUR times daily   Osteoarthritis    Serrated adenoma of colon 07/17/2016   Sliding hiatal hernia    T2DM (type 2 diabetes mellitus) (HCC)    Current Outpatient Medications  Medication Sig Dispense Refill   dorzolamide-timolol (COSOPT) 22.3-6.8 MG/ML ophthalmic solution Place 1 drop into the right eye 2 (two) times daily.     gabapentin (NEURONTIN) 400 MG capsule Take 1,200 mg by mouth at bedtime.     losartan-hydrochlorothiazide (HYZAAR) 100-12.5 MG tablet Take 1 tablet by mouth daily.     metFORMIN (GLUCOPHAGE-XR) 500 MG 24 hr tablet Take 1,000 mg by mouth 2 (two) times daily.      Multiple Vitamin (MULTIVITAMIN WITH MINERALS) TABS tablet Take 1 tablet by mouth in the morning. Multivitamin for Seniors     omeprazole (PRILOSEC) 20 MG capsule Take 20 mg by mouth in the morning.     oxymetazoline (AFRIN) 0.05 % nasal spray Place 1 spray into both nostrils in the morning.     OZEMPIC, 2 MG/DOSE, 8 MG/3ML SOPN Inject into the skin.     tamsulosin (FLOMAX) 0.4 MG CAPS capsule Take 1 capsule (0.4 mg total) by mouth daily. (Patient taking differently: Take 0.4 mg by mouth every evening.) 30 capsule 1   atorvastatin (LIPITOR) 10 MG tablet Take 10 mg by mouth  in the morning.     No current facility-administered medications for this visit.    Physical Exam BP 117/67 (BP Location: Left Arm, Patient Position: Sitting, Cuff Size: Normal)   Pulse 83   Resp 20   Ht 5\' 11"  (1.803 m)   Wt 215 lb 6.4 oz (97.7 kg)   SpO2 96% Comment: RA  BMI 30.5 kg/m  78 year old man in no acute distress Alert and oriented x 3 with no focal deficits Lungs clear with equal breath sounds bilaterally Cardiac regular rate and rhythm with normal S1-S2 and no murmur No peripheral edema No carotid bruits  Diagnostic Tests: CT ANGIOGRAPHY CHEST WITH CONTRAST   TECHNIQUE: Multidetector CT imaging of the chest was  performed using the standard protocol during bolus administration of intravenous contrast. Multiplanar CT image reconstructions and MIPs were obtained to evaluate the vascular anatomy.   RADIATION DOSE REDUCTION: This exam was performed according to the departmental dose-optimization program which includes automated exposure control, adjustment of the mA and/or kV according to patient size and/or use of iterative reconstruction technique.   CONTRAST:  60mL ISOVUE-370 IOPAMIDOL (ISOVUE-370) INJECTION 76%   COMPARISON:  January 15, 2023, June 26, 2022, November 23, 2021, May 23, 2021, May 17, 2020, March 12, 2019   FINDINGS: Cardiovascular: No cardiomegaly or pericardial effusion. Normal variant 2 vessel aortic arch anatomy. The ascending aorta remains dilated. Similar dilation of the aortic root, measuring 4.7 cm. at the level of the pulmonary artery, the ascending aorta measures 4.8 cm (previously 4.6 cm, by my measurement). The aortic arch and ascending aorta are not dilated. Dense multi-vessel coronary atherosclerosis. No aortic dissection. Minimal aortic atherosclerosis.   Mediastinum/Nodes: No mediastinal mass. No mediastinal, hilar, or axillary lymphadenopathy. Calcified right hilar and mediastinal lymph nodes, likely due to chronic granulomatous infection.   Lungs/Pleura: The midline trachea and bronchi are patent. No focal airspace consolidation, pleural effusion, or pneumothorax. Scattered calcified granulomas in the right lower lobe. In the superior segment of the right lower lobe, there is a 4 mm lung nodule, which is new.   Musculoskeletal: No acute fracture or destructive bone lesion. Multilevel degenerative disc disease of the spine. Small volume symmetric bilateral gynecomastia.   Upper Abdomen: No acute abnormality in the partially visualized upper abdomen. Cholecystectomy.   Review of the MIP images confirms the above findings.   IMPRESSION: 1.  Similar appearance of the ascending aortic aneurysm. For example, the aortic root measures 4.7 cm, unchanged. The ascending aorta, at the level of the main pulmonary artery, measures 4.8 cm (previously, 4.6 cm, by my measurement). No aortic dissection or findings to suggest complication. Ascending thoracic aortic aneurysm. Recommend semi-annual imaging followup by CTA or MRA and referral to cardiothoracic surgery if not already obtained. This recommendation follows 2010 ACCF/AHA/AATS/ACR/ASA/SCA/SCAI/SIR/STS/SVM Guidelines for the Diagnosis and Management of Patients With Thoracic Aortic Disease. Circulation. 2010; 121: Z610-R604. Aortic aneurysm NOS (ICD10-I71.9) 2. New 4 mm lung nodule in the superior segment of the right lower lobe (axial 59). Follow-up should be considered, as documented below. 3. Small volume symmetric bilateral gynecomastia.   If patient is low risk for malignancy, no routine follow-up imaging is recommended. If patient is high risk for malignancy, a non-contrast chest CT at 12 months is optional.This recommendation follows the consensus statement: Guidelines for Management of Incidental Pulmonary Nodules Detected on CT Images: From the Fleischner Society 2017; Radiology 2017; [phone].   Aortic Atherosclerosis (ICD10-I70.0).     Electronically Signed   By: Ainsley Spinner.D.  On: 07/08/2023 12:23 I personally reviewed the CT images.  Stable to slightly increased 4.6 cm ascending aneurysm.  Thoracic aortic atherosclerosis.  Tiny nodule right lower lobe.  Impression: Calvin Roth is a 78 year old man with a history of hypertension, hyperlipidemia, obesity, type II diabetes, neurogenic bladder, granulomatous lung nodules, ascending aneurysm, stage III chronic kidney disease, BPH, hiatal hernia, and arthritis.  Ascending aneurysm.  Measured at 4.8 cm by radiology.  To my measurement on coronal and sagittal's there appears to be about 4.6 cm.   Regardless there is no indication for surgery at the present time and he needs continued semiannual follow-up.  Hypertension-blood pressure well-controlled.  Hyperlipidemia-on Lipitor.  Right lower lobe lung nodule-has had a history of lung nodules in the past.  4 mm nodule.  He is a lifelong non-smoker.  Would not require follow-up on its own, but can be followed along with his aneurysm.  Plan: Return in 6 months with CT angio of chest  Zelphia Higashi, MD [facility] [phone]

## 2023-10-24 ENCOUNTER — Other Ambulatory Visit: Payer: Self-pay | Admitting: Internal Medicine

## 2023-10-24 DIAGNOSIS — R748 Abnormal levels of other serum enzymes: Secondary | ICD-10-CM

## 2023-10-24 DIAGNOSIS — N289 Disorder of kidney and ureter, unspecified: Secondary | ICD-10-CM

## 2023-11-04 ENCOUNTER — Ambulatory Visit
Admission: RE | Admit: 2023-11-04 | Discharge: 2023-11-04 | Disposition: A | Discharge status: Home / Self Care | Source: Ambulatory Visit | Attending: Internal Medicine | Admitting: Internal Medicine

## 2023-11-04 DIAGNOSIS — R748 Abnormal levels of other serum enzymes: Secondary | ICD-10-CM | POA: Diagnosis present

## 2023-11-04 DIAGNOSIS — N289 Disorder of kidney and ureter, unspecified: Secondary | ICD-10-CM | POA: Diagnosis present

## 2023-11-08 ENCOUNTER — Emergency Department

## 2023-11-08 ENCOUNTER — Emergency Department
Admission: EM | Admit: 2023-11-08 | Discharge: 2023-11-09 | Disposition: A | Discharge status: Home / Self Care | Attending: Emergency Medicine | Admitting: Emergency Medicine

## 2023-11-08 DIAGNOSIS — R0789 Other chest pain: Secondary | ICD-10-CM | POA: Insufficient documentation

## 2023-11-08 LAB — TROPONIN I (HIGH SENSITIVITY)
Troponin I (High Sensitivity): 5 ng/L (ref <18)
Troponin I (High Sensitivity): 4 ng/L (ref <18)

## 2023-11-08 LAB — CBC
HCT: 38 % — ABNORMAL LOW (ref 39.0–52.0)
Hemoglobin: 12.6 g/dL — ABNORMAL LOW (ref 13.0–17.0)
MCH: 31.6 pg (ref 26.0–34.0)
MCHC: 33.2 g/dL (ref 30.0–36.0)
MCV: 95.2 fL (ref 80.0–100.0)
Platelets: 189 K/uL (ref 150–400)
RBC: 3.99 MIL/uL — ABNORMAL LOW (ref 4.22–5.81)
RDW: 12.6 % (ref 11.5–15.5)
WBC: 8.8 K/uL (ref 4.0–10.5)
nRBC: 0.2 % (ref 0.0–0.2)

## 2023-11-08 LAB — BASIC METABOLIC PANEL WITH GFR
Anion gap: 9 (ref 5–15)
BUN: 52 mg/dL — ABNORMAL HIGH (ref 8–23)
CO2: 24 mmol/L (ref 22–32)
Calcium: 9.3 mg/dL (ref 8.9–10.3)
Chloride: 100 mmol/L (ref 98–111)
Creatinine, Ser: 2.66 mg/dL — ABNORMAL HIGH (ref 0.61–1.24)
GFR, Estimated: 24 mL/min — ABNORMAL LOW (ref >60)
Glucose, Bld: 193 mg/dL — ABNORMAL HIGH (ref 70–99)
Potassium: 4.6 mmol/L (ref 3.5–5.1)
Sodium: 133 mmol/L — ABNORMAL LOW (ref 135–145)

## 2023-11-08 MED ORDER — SODIUM CHLORIDE 0.9 % IV SOLN: Freq: Once | INTRAVENOUS | Status: AC

## 2023-11-08 MED ORDER — MORPHINE SULFATE (PF) 4 MG/ML IV SOLN
4.0000 mg | Freq: Once | INTRAVENOUS | Status: AC
  Administered 2023-11-08: 4 mg via INTRAVENOUS
  Filled 2023-11-08: qty 1

## 2023-11-08 MED ORDER — ONDANSETRON HCL 4 MG/2ML IJ SOLN
4.0000 mg | Freq: Once | INTRAMUSCULAR | Status: AC
  Administered 2023-11-08: 4 mg via INTRAVENOUS
  Filled 2023-11-08: qty 2

## 2023-11-08 MED ORDER — IOHEXOL 350 MG/ML SOLN
80.0000 mL | Freq: Once | INTRAVENOUS | Status: AC | PRN
  Administered 2023-11-08: 80 mL via INTRAVENOUS

## 2023-11-08 NOTE — ED Provider Notes (Signed)
 [facility] Provider Note    Event Date/Time   First MD Initiated Contact with Patient 11/08/23 1804     (approximate)   History   Chest Pain   HPI  Calvin Roth is a 78 y.o. male with known history of a ascending aortic aneurysm who presents with complaints of chest pressure.  He reports this developed this morning and he reports the pain seems to be in his throat as well.  Reviewed medical records, last CT angiography was on April 26, stable exam.  Patient reports kidney function has worsened over the last 6 months, unclear reason, he does see nephrology.     Physical Exam   Triage Vital Signs: ED Triage Vitals  Encounter Vitals Group     BP 11/08/23 1432 132/72     Girls Systolic BP Percentile --      Girls Diastolic BP Percentile --      Boys Systolic BP Percentile --      Boys Diastolic BP Percentile --      Pulse Rate 11/08/23 1432 86     Resp 11/08/23 1432 18     Temp 11/08/23 1432 98.5 F (36.9 C)     Temp Source 11/08/23 1432 Oral     SpO2 11/08/23 1432 100 %     Weight --      Height --      Head Circumference --      Peak Flow --      Pain Score 11/08/23 1430 6     Pain Loc --      Pain Education --      Exclude from Growth Chart --     Most recent vital signs: Vitals:   11/08/23 1705 11/08/23 1823  BP: 121/72   Pulse: 89   Resp: 18   Temp: 99.2 F (37.3 C)   SpO2: 100% 100%     General: Awake, no distress.  CV:  Good peripheral perfusion.  Resp:  Normal effort.  Abd:  No distention.  Other:     ED Results / Procedures / Treatments   Labs (all labs ordered are listed, but only abnormal results are displayed) Labs Reviewed  BASIC METABOLIC PANEL WITH GFR - Abnormal; Notable for the following components:      Result Value   Sodium 133 (*)    Glucose, Bld 193 (*)    BUN 52 (*)    Creatinine, Ser 2.66 (*)    GFR, Estimated 24 (*)    All other components within normal limits  CBC - Abnormal; Notable for the  following components:   RBC 3.99 (*)    Hemoglobin 12.6 (*)    HCT 38.0 (*)    All other components within normal limits  TROPONIN I (HIGH SENSITIVITY)  TROPONIN I (HIGH SENSITIVITY)     EKG  ED ECG REPORT I, Lamar Price, the attending physician, personally viewed and interpreted this ECG.  Date: 11/08/2023  Rhythm: normal sinus rhythm QRS Axis: normal Intervals: normal ST/T Wave abnormalities: normal Narrative Interpretation: no evidence of acute ischemia    RADIOLOGY Chest x-ray viewed interpreted by me, no acute abnormality    PROCEDURES:  Critical Care performed:   Procedures   MEDICATIONS ORDERED IN ED: Medications  0.9 %  sodium chloride  infusion (0 mLs Intravenous Stopped 11/08/23 2046)  morphine  (PF) 4 MG/ML injection 4 mg (4 mg Intravenous Given 11/08/23 1943)  ondansetron  (ZOFRAN ) injection 4 mg (4 mg Intravenous Given 11/08/23 1943)  iohexol  (OMNIPAQUE ) 350 MG/ML injection 80 mL (80 mLs Intravenous Contrast Given 11/08/23 1953)     IMPRESSION / MDM / ASSESSMENT AND PLAN / ED COURSE  I reviewed the triage vital signs and the nursing notes. Patient's presentation is most consistent with acute presentation with potential threat to life or bodily function.  Patient with known aortic aneurysm presents with chest pressure and discomfort in his neck.  Differential includes ACS, AAS, esophagitis  EKG, high sensitive troponin are reassuring, he does have CKD, discussed with him risks and benefits of proceeding with CT angio and he has opted to do so.  CT angio is negative for dissection  Patient is feeling much improved, offered admission however he declined, we will discharge the patient with close cardiology follow-up, strict return precautions discussed        FINAL CLINICAL IMPRESSION(S) / ED DIAGNOSES   Final diagnoses:  Atypical chest pain     Rx / DC Orders   ED Discharge Orders          Ordered    Ambulatory referral to Cardiology        Comments: If you have not heard from the Cardiology office within the next 72 hours please call [phone].   11/08/23 2058             Note:  This document was prepared using Dragon voice recognition software and may include unintentional dictation errors.   Arlander Charleston, MD 11/08/23 2125

## 2023-11-08 NOTE — ED Triage Notes (Signed)
 Pt to ED from home with centralized non radiating CP since this morning. Pt denies dizziness/ SOB. A&O x4

## 2023-11-08 NOTE — ED Notes (Signed)
 Pt reporting to ED d/t CP that began this morning. Pt denies having any SOB or dizziness. Pain does not radiate per pt. Pain rated 6-7/10 at this time. Pt ABCs intact. RR even and unlabored. Pt in NAD. Bed in lowest locked position. Call bell in reach. Denies needs at this time.   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 Moderate to marked atypia, edge involved. Excised 09/12/2006, margins free.   Dysplastic nevus 11/19/2007   Left costal margin/infrapectoral. Slight to moderate atypia, margin focally involved.   Dysplastic nevus 11/19/2007   Right lateral flank near waistline. Moderate atypia, extends to one edge.    Dysplastic nevus 05/25/2019   Left lateral tricep. Moderate atypia, deep margin involved.    GERD (gastroesophageal reflux disease)    Hepatic steatosis    Hyperlipidemia    Hypertension    Intermittent alternating exotropia    Neurogenic bladder    a.) self catheriterizes FOUR times daily   Osteoarthritis    Serrated adenoma of colon 07/17/2016   Sliding hiatal hernia    T2DM (type 2 diabetes mellitus) (HCC)

## 2023-11-09 NOTE — ED Notes (Signed)
 Pt ABCs intact. RR even and unlabored. Pt in NAD. Bed in lowest locked position. Call bell in reach. Denies needs at this time.

## 2023-12-16 ENCOUNTER — Ambulatory Visit: Admitting: Urology

## 2023-12-18 ENCOUNTER — Other Ambulatory Visit: Payer: Self-pay | Admitting: Thoracic Surgery (Cardiothoracic Vascular Surgery)

## 2023-12-18 DIAGNOSIS — I7121 Aneurysm of the ascending aorta, without rupture: Secondary | ICD-10-CM

## 2023-12-23 NOTE — Progress Notes (Deleted)
 Cardiology Office Note  Date:  12/23/2023   ID:  Douglass, [place] [birth year], MRN [id number]  PCP:  Lenon Layman ORN, MD   No chief complaint on file.   HPI:  Calvin Roth a 78 y.o. 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 Aortic atherosclerosis Coronary calcification Hypertension Hyperlipidemia Stage III chronic kidney disease Diabetes type 2 Who presents by referral from [facility] for atypical chest pain    Followed by CT surgery CT scan November 08, 2023 unchanged 4.6 cm ascending aorta aneurysm Aortic atherosclerosis Coronary calcification  PMH:   has a past medical history of Actinic keratosis, Aortic atherosclerosis (HCC), Aortic root dilatation (HCC) (03/12/2019), Ascending aortic aneurysm (HCC) (10/31/2017), BPH (benign prostatic hyperplasia), CKD (chronic kidney disease), stage III (HCC), Coronary artery disease, DDD (degenerative disc disease), lumbar, 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  PSH:    Past Surgical History:  Procedure Laterality Date   CATARACT EXTRACTION Left 05/15/2018   CATARACT EXTRACTION Right 04/03/2018   CHOLECYSTECTOMY     COLONOSCOPY WITH PROPOFOL  N/A 07/17/2016   Procedure: COLONOSCOPY WITH PROPOFOL ;  Surgeon: Gladis RAYMOND Mariner, MD;  Location: [facility] ENDOSCOPY;  Service: Endoscopy;  Laterality: N/A;   COLONOSCOPY WITH PROPOFOL  N/A 02/04/2018   Procedure: COLONOSCOPY WITH PROPOFOL ;  Surgeon: Mariner Gladis RAYMOND, MD;  Location: [facility] ENDOSCOPY;  Service: Endoscopy;  Laterality: N/A;   EYE SURGERY Left 2000    Strabismus correction   FOREIGN BODY REMOVAL Left 10/23/2019   Procedure: REMOVAL FOREIGN BODY LEFT HEEL;  Surgeon: Ashley Soulier, DPM;  Location: [facility] ORS;  Service: Podiatry;  Laterality: Left;   GASTRIC RESTRICTION SURGERY N/A 1982   stomach stapled for weight loss   LUMBAR FUSION N/A 02/10/2020   Procedure: PLIF,IP,PSI L2- L3;EXPL FUSION; Location: [facility]; Surgeon: Reyes Budge, MD   POSTERIOR LUMBAR FUSION N/A 05/04/2015   Procedure: LUMBAR THREE-FOUR, LUMBAR FOUR-FIVE POSTERIOR LUMBAR FUSION; Location: [facility]; Surgeon: Reyes Budge, MD   TONSILLECTOMY Bilateral 1954   TRANSANAL EXCISION OF RECTAL MASS N/A 09/18/2016   Procedure: TRANSANAL EXCISION OF RECTAL TUMOR;  Surgeon: Dellie Louanne MATSU, MD;  Location: [facility] ORS;  Service: General;  Laterality: N/A;   ULNAR COLLATERAL LIGAMENT REPAIR Left 12/28/2020   Procedure: LEFT THUMB ULNAR COLLATERAL LIGAMENT REPAIR/RECONSTRUCTION;  Surgeon: Edie Norleen PARAS, MD;  Location: [facility] ORS;  Service: Orthopedics;  Laterality: Left;    Current Outpatient Medications  Medication Sig Dispense Refill   atorvastatin  (LIPITOR) 10 MG tablet Take 10 mg by mouth in the morning.     dorzolamide -timolol  (COSOPT ) 22.3-6.8 MG/ML ophthalmic solution Place 1 drop into the right eye 2 (two) times daily.     gabapentin  (NEURONTIN ) 400 MG capsule Take 1,200 mg by mouth at bedtime.     losartan -hydrochlorothiazide  (HYZAAR) 100-12.5 MG tablet Take 1 tablet by mouth daily.     metFORMIN  (GLUCOPHAGE -XR) 500 MG 24 hr tablet Take 1,000 mg by mouth 2 (two) times daily.      Multiple Vitamin (MULTIVITAMIN WITH MINERALS) TABS tablet Take 1 tablet by mouth in the morning. Multivitamin for Seniors     omeprazole (PRILOSEC) 20 MG capsule Take 20 mg by mouth in the morning.     oxymetazoline (AFRIN) 0.05 % nasal spray Place 1  spray into both nostrils in the morning.     OZEMPIC, 2 MG/DOSE, 8 MG/3ML SOPN Inject into the skin.     tamsulosin   (FLOMAX ) 0.4 MG CAPS capsule Take 1 capsule (0.4 mg total) by mouth daily. (Patient taking differently: Take 0.4 mg by mouth every evening.) 30 capsule 1   No current facility-administered medications for this visit.     Allergies:   Patient has no known allergies.   Social History:  The patient  reports that he has never smoked. He has never used smokeless tobacco. He reports current alcohol use. He reports that he does not use drugs.   Family History:   family history is not on file.    Review of Systems: ROS   PHYSICAL EXAM: VS:  There were no vitals taken for this visit. , BMI There is no height or weight on file to calculate BMI. GEN: Well nourished, well developed, in no acute distress HEENT: normal Neck: no JVD, carotid bruits, or masses Cardiac: RRR; no murmurs, rubs, or gallops,no edema  Respiratory:  clear to auscultation bilaterally, normal work of breathing GI: soft, nontender, nondistended, + BS MS: no deformity or atrophy Skin: warm and dry, no rash Neuro:  Strength and sensation are intact Psych: euthymic mood, full affect    Recent Labs: 11/08/2023: BUN 52; Creatinine, Ser 2.66; Hemoglobin 12.6; Platelets 189; Potassium 4.6; Sodium 133    Lipid Panel No results found for: CHOL, HDL, LDLCALC, TRIG    Wt Readings from Last 3 Encounters:  07/16/23 215 lb 6.4 oz (97.7 kg)  01/15/23 212 lb (96.2 kg)  07/03/22 216 lb (98 kg)       ASSESSMENT AND PLAN:  Problem List Items Addressed This Visit   None    Disposition:   F/U  12 months   Total encounter time more than 30 minutes  Greater than 50% was spent in counseling and coordination of care with the patient    Signed, Velinda Lunger, M.D., Ph.D. [facility] [place], [place] [phone]

## 2023-12-24 ENCOUNTER — Ambulatory Visit: Admitting: Cardiovascular Disease

## 2023-12-25 ENCOUNTER — Ambulatory Visit (HOSPITAL_COMMUNITY)
Admission: RE | Admit: 2023-12-25 | Discharge: 2023-12-25 | Disposition: A | Discharge status: Home / Self Care | Source: Ambulatory Visit | Attending: Thoracic Surgery (Cardiothoracic Vascular Surgery) | Admitting: Thoracic Surgery (Cardiothoracic Vascular Surgery)

## 2023-12-25 DIAGNOSIS — I7121 Aneurysm of the ascending aorta, without rupture: Secondary | ICD-10-CM | POA: Diagnosis present

## 2023-12-25 LAB — POCT I-STAT CREATININE: Creatinine, Ser: 2 mg/dL — ABNORMAL HIGH (ref 0.61–1.24)

## 2023-12-25 MED ORDER — IOHEXOL 350 MG/ML SOLN
75.0000 mL | Freq: Once | INTRAVENOUS | Status: AC | PRN
  Administered 2023-12-25: 75 mL via INTRAVENOUS

## 2024-01-20 NOTE — Progress Notes (Unsigned)
 [address] [place] [address]             [phone]            Calvin Roth [id number] 08-21-1945   History of Present Illness:  Calvin Roth is a 78 year old man with medical history of hypertension, type 2 diabetes, degenerative disc disease, BPH and spinal stenosis who presents for continued follow-up of ascending thoracic aortic aneurysm.  Aneurysm has remained stable in size measuring on recent CT of chest 4.6 cm.  Aortic root measured slightly larger at 4.9 cm.   He presents the clinic today and reports no concerns.  His blood pressure is well-controlled with current medication therapy.  He exercises daily on the elliptical.  He recently has lost 60 pounds with the use of Mounjaro for his diabetes.  He denies chest pain, shortness of breath and lower leg swelling.   Current Outpatient Medications on File Prior to Visit  Medication Sig Dispense Refill   atorvastatin  (LIPITOR) 10 MG tablet Take 10 mg by mouth in the morning.     dorzolamide -timolol  (COSOPT ) 22.3-6.8 MG/ML ophthalmic solution Place 1 drop into the right eye 2 (two) times daily.     gabapentin  (NEURONTIN ) 400 MG capsule Take 1,200 mg by mouth at bedtime.     losartan -hydrochlorothiazide  (HYZAAR) 100-12.5 MG tablet Take 1 tablet by mouth daily. (Patient not taking: Reported on 01/21/2024)     metFORMIN  (GLUCOPHAGE -XR) 500 MG 24 hr tablet Take 1,000 mg by mouth 2 (two) times daily.      Multiple Vitamin (MULTIVITAMIN WITH MINERALS) TABS tablet Take 1 tablet by mouth in the morning. Multivitamin for Seniors     omeprazole (PRILOSEC) 20 MG capsule Take 20 mg by mouth in the morning.     oxymetazoline (AFRIN) 0.05 % nasal spray Place 1 spray into both nostrils in the morning.     OZEMPIC, 2 MG/DOSE, 8 MG/3ML SOPN Inject into the skin. (Patient not taking: Reported on 01/21/2024)     tamsulosin  (FLOMAX ) 0.4 MG CAPS capsule Take 1 capsule (0.4 mg total) by mouth daily. (Patient  taking differently: Take 0.4 mg by mouth every evening.) 30 capsule 1   No current facility-administered medications on file prior to visit.     ROS: Review of Systems  Constitutional: Negative.  Negative for malaise/fatigue.  Respiratory: Negative.  Negative for cough and shortness of breath.   Cardiovascular: Negative.  Negative for chest pain and leg swelling.     BP 133/73   Pulse 87   Resp 20   Ht 5' 11 (1.803 m)   Wt 197 lb (89.4 kg)   SpO2 98% Comment: RA  BMI 27.48 kg/m   Physical Exam Constitutional:      Appearance: Normal appearance.  HENT:     Head: Normocephalic and atraumatic.  Cardiovascular:     Rate and Rhythm: Normal rate and regular rhythm.     Heart sounds: Normal heart sounds, S1 normal and S2 normal.  Pulmonary:     Effort: Pulmonary effort is normal.     Breath sounds: Normal breath sounds.  Skin:    General: Skin is warm and dry.  Neurological:     General: No focal deficit present.     Mental Status: He is alert and oriented to person, place, and time.      Imaging: CLINICAL DATA:  Evaluate thoracic aortic aneurysm.   EXAM: CT ANGIOGRAPHY CHEST  WITH CONTRAST   TECHNIQUE: Multidetector CT imaging of the chest was performed using the standard protocol during bolus administration of intravenous contrast. Multiplanar CT image reconstructions and MIPs were obtained to evaluate the vascular anatomy.   RADIATION DOSE REDUCTION: This exam was performed according to the departmental dose-optimization program which includes automated exposure control, adjustment of the mA and/or kV according to patient size and/or use of iterative reconstruction technique.   CONTRAST:  75mL OMNIPAQUE  IOHEXOL  350 MG/ML SOLN   COMPARISON:  11/08/2023, 01/15/2023 .   FINDINGS: Cardiovascular: Heart is normal size. Calcified plaque over the 3 vessel coronary arteries.   Continued evidence of ascending thoracic aortic aneurysm measuring 4.6 cm as this is  unchanged. Aortic root measures 4.9 cm unchanged. Remainder of the thoracic aorta is normal in caliber. Bovine arch morphology. Minimal calcified plaque over the descending thoracic aorta.   Remaining vascular structures are unremarkable.   Mediastinum/Nodes: No evidence of mediastinal or hilar adenopathy. There is calcified right hilar and mediastinal lymph nodes present.   Lungs/Pleura: Lungs are adequately inflated. Several calcified granulomas are present. Several tiny subcentimeter nodular densities within the right lung unchanged from October 2024 as the largest nodule measures 4 mm over the right lower lobe. No acute airspace process or effusion. Airways are unremarkable.   Upper Abdomen: Upper abdominal images are unchanged. No acute findings.   Musculoskeletal: No focal abnormality.   Review of the MIP images confirms the above findings.   IMPRESSION: 1. Stable ascending thoracic aortic aneurysm measuring 4.6 cm. Aortic root measures 4.9 cm unchanged. Recommend semi-annual imaging followup by CTA or MRA and referral to cardiothoracic surgery if not already obtained. 2010; 121: [id number]. 2. Evidence of prior granulomatous disease. 3. Several tiny subcentimeter nodular densities within the right lung unchanged from October 2024 as the largest nodule measures 4 mm over the right lower lobe. These are benign as no further follow-up imaging recommended. 4. Aortic atherosclerosis. Atherosclerotic coronary artery disease.   Aortic Atherosclerosis (ICD10-I70.0).     Electronically Signed   By: Toribio Agreste M.D.   On: 12/25/2023 12:21     A/P:  Aneurysm of ascending aorta without rupture -4.6 cm ascending thoracic aortic aneurysm and 4.9 cm aortic root on CTA of chest. We discussed the natural history and and risk factors for growth of ascending aortic aneurysms. Discussed recommendations to minimize the risk of further expansion or dissection including careful blood  pressure control, avoidance of contact sports and heavy lifting, attention to lipid management.  We covered the importance of staying never user of tobacco.  The patient does not yet meet surgical criteria of >5.5cm. The patient is aware of signs and symptoms of aortic dissection and when to present to the emergency department   -Follow up in 6 months with CTA of chest   Risk Modification:  Statin:  atorvastatin   Smoking cessation instruction/counseling given:  never user  Patient was counseled on importance of Blood Pressure Control  They are instructed to contact their Primary Care Physician if they start to have blood pressure readings over 130s/90s. Do not ever stop blood pressure medications on your own, unless instructed by healthcare professional.  Please avoid use of Fluoroquinolones as this can potentially increase your risk of Aortic Rupture and/or Dissection  Patient educated on signs and symptoms of Aortic Dissection, handout also provided in AVS  Manuelita CHRISTELLA Rough, PA-C 01/21/24

## 2024-01-21 ENCOUNTER — Ambulatory Visit

## 2024-01-21 VITALS — BP 133/73 | HR 87 | Resp 20 | Ht 71.0 in | Wt 197.0 lb

## 2024-01-21 DIAGNOSIS — I7121 Aneurysm of the ascending aorta, without rupture: Secondary | ICD-10-CM | POA: Diagnosis present

## 2024-01-21 NOTE — Patient Instructions (Signed)

## 2024-01-22 ENCOUNTER — Encounter: Payer: Self-pay | Admitting: Urology

## 2024-01-22 ENCOUNTER — Ambulatory Visit (INDEPENDENT_AMBULATORY_CARE_PROVIDER_SITE_OTHER): Admitting: Urology

## 2024-01-22 VITALS — BP 122/77 | HR 97 | Ht 71.0 in | Wt 190.0 lb

## 2024-01-22 DIAGNOSIS — N312 Flaccid neuropathic bladder, not elsewhere classified: Secondary | ICD-10-CM

## 2024-01-22 DIAGNOSIS — R339 Retention of urine, unspecified: Secondary | ICD-10-CM | POA: Diagnosis not present

## 2024-01-22 LAB — URINALYSIS, COMPLETE
Bilirubin, UA: NEGATIVE
Glucose, UA: NEGATIVE
Ketones, UA: NEGATIVE
Nitrite, UA: NEGATIVE
Protein,UA: NEGATIVE
Specific Gravity, UA: 1.01 (ref 1.005–1.030)
Urobilinogen, Ur: 0.2 mg/dL (ref 0.2–1.0)
pH, UA: 6 (ref 5.0–7.5)

## 2024-01-22 LAB — MICROSCOPIC EXAMINATION: WBC, UA: >30 /HPF — AB (ref 0–5)

## 2024-01-22 NOTE — Progress Notes (Signed)
 01/22/2024 12:50 PM   Calvin Roth 1945-10-13 [id number]  Referring provider: Dennise Capri, MD [address] [facility] [address] [place],  [place] [address]  Chief Complaint  Patient presents with   pyuria    HPI: Calvin Roth is a 78 y.o. male referred for evaluation of pyuria.  Status post L2-L3 spinal surgery 02/10/2020.  Prior to his surgery he states he was voiding without problems however when catheter was placed preoperatively there was return of 2.5 L of urine.  Postoperatively he was unable to void Was seen by [facility] and a urodynamic study on 03/31/2020 showed a residual of 460 mL.  His cystometric capacity was 1250 mL.  He was unable to generate a detrusor contraction all attempts to void and no flow was obtained.  He was started on intermittent catheterization Has continued on intermittent catheterization since that time at 3-5 times per day Although he was referred for abnormal urinalysis he states the main reason of his visit was to establish local urologic care No complaints today He has been on tamsulosin  since 2021   PMH: Past Medical History:  Diagnosis Date   Actinic keratosis    Aortic atherosclerosis    Aortic root dilatation 03/12/2019   a.) CTA 03/22/2019 --> 4.5 cm   Ascending aortic aneurysm 10/31/2017   a.) fusiform; measured 4.4 cm   BPH (benign prostatic hyperplasia)    CKD (chronic kidney disease), stage III (HCC)    Coronary artery disease    DDD (degenerative disc disease), lumbar    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 Moderate to marked atypia, edge involved. Excised 09/12/2006, margins free.   Dysplastic nevus 11/19/2007   Left costal margin/infrapectoral. Slight to moderate atypia, margin focally involved.   Dysplastic nevus 11/19/2007   Right lateral flank near waistline. Moderate atypia, extends to one edge.    Dysplastic nevus 05/25/2019   Left lateral tricep.  Moderate atypia, deep margin involved.    GERD (gastroesophageal reflux disease)    Hepatic steatosis    Hyperlipidemia    Hypertension    Intermittent alternating exotropia    Neurogenic bladder    a.) self catheriterizes FOUR times daily   Osteoarthritis    Serrated adenoma of colon 07/17/2016   Sliding hiatal hernia    T2DM (type 2 diabetes mellitus) [facility])     Surgical History: Past Surgical History:  Procedure Laterality Date   CATARACT EXTRACTION Left 05/15/2018   CATARACT EXTRACTION Right 04/03/2018   CHOLECYSTECTOMY     COLONOSCOPY WITH PROPOFOL  N/A 07/17/2016   Procedure: COLONOSCOPY WITH PROPOFOL ;  Surgeon: Gladis RAYMOND Mariner, MD;  Location: [facility] ENDOSCOPY;  Service: Endoscopy;  Laterality: N/A;   COLONOSCOPY WITH PROPOFOL  N/A 02/04/2018   Procedure: COLONOSCOPY WITH PROPOFOL ;  Surgeon: Mariner Gladis RAYMOND, MD;  Location: [facility] ENDOSCOPY;  Service: Endoscopy;  Laterality: N/A;   EYE SURGERY Left 2000   Strabismus correction   FOREIGN BODY REMOVAL Left 10/23/2019   Procedure: REMOVAL FOREIGN BODY LEFT HEEL;  Surgeon: Ashley Soulier, DPM;  Location: [facility] ORS;  Service: Podiatry;  Laterality: Left;   GASTRIC RESTRICTION SURGERY N/A 1982   stomach stapled for weight loss   LUMBAR FUSION N/A 02/10/2020   Procedure: PLIF,IP,PSI L2- L3;EXPL FUSION; Location: [facility]; Surgeon: Reyes Budge, MD   POSTERIOR LUMBAR FUSION N/A 05/04/2015   Procedure: LUMBAR THREE-FOUR, LUMBAR FOUR-FIVE POSTERIOR LUMBAR FUSION; Location: [facility]; Surgeon: Reyes Budge, MD  TONSILLECTOMY Bilateral 1954   TRANSANAL EXCISION OF RECTAL MASS N/A 09/18/2016   Procedure: TRANSANAL EXCISION OF RECTAL TUMOR;  Surgeon: Dellie Louanne MATSU, MD;  Location: [facility] ORS;  Service: General;  Laterality: N/A;   ULNAR COLLATERAL LIGAMENT REPAIR Left 12/28/2020   Procedure: LEFT THUMB ULNAR COLLATERAL LIGAMENT REPAIR/RECONSTRUCTION;  Surgeon: Edie Norleen PARAS, MD;  Location: [facility] ORS;   Service: Orthopedics;  Laterality: Left;    Home Medications:  Allergies as of 01/22/2024       Reactions   Quinolones Other (See Comments)   Ascending thoracic aortic aneurysm         Medication List        Accurate as of January 22, 2024 12:50 PM. If you have any questions, ask your nurse or doctor.          atorvastatin  10 MG tablet Commonly known as: LIPITOR Take 10 mg by mouth in the morning.   dorzolamide -timolol  2-0.5 % ophthalmic solution Commonly known as: COSOPT  Place 1 drop into the right eye 2 (two) times daily.   gabapentin  400 MG capsule Commonly known as: NEURONTIN  Take 1,200 mg by mouth at bedtime.   losartan  100 MG tablet Commonly known as: COZAAR  Take 100 mg by mouth daily.   losartan -hydrochlorothiazide  100-12.5 MG tablet Commonly known as: HYZAAR Take 1 tablet by mouth daily.   metFORMIN  500 MG 24 hr tablet Commonly known as: GLUCOPHAGE -XR Take 1,000 mg by mouth 2 (two) times daily.   Mounjaro 15 MG/0.5ML Pen Generic drug: tirzepatide Inject 15 mg into the skin once a week.   multivitamin with minerals Tabs tablet Take 1 tablet by mouth in the morning. Multivitamin for Seniors   omeprazole 20 MG capsule Commonly known as: PRILOSEC Take 20 mg by mouth in the morning.   oxymetazoline 0.05 % nasal spray Commonly known as: AFRIN Place 1 spray into both nostrils in the morning.   Ozempic (2 MG/DOSE) 8 MG/3ML Sopn Generic drug: Semaglutide (2 MG/DOSE) Inject into the skin.   tamsulosin  0.4 MG Caps capsule Commonly known as: FLOMAX  Take 1 capsule (0.4 mg total) by mouth daily. What changed: when to take this        Allergies:  Allergies  Allergen Reactions   Quinolones Other (See Comments)    Ascending thoracic aortic aneurysm     Family History: Family History  Problem Relation Age of Onset   Colon cancer Neg Hx    Breast cancer Neg Hx     Social History:  reports that he has never smoked. He has never used  smokeless tobacco. He reports current alcohol use. He reports that he does not use drugs.   Physical Exam: BP 122/77   Pulse 97   Ht 5' 11 (1.803 m)   Wt 190 lb (86.2 kg)   BMI 26.50 kg/m   Constitutional:  Alert, No acute distress. HEENT: [place] AT Respiratory: Normal respiratory effort, no increased work of breathing. Psychiatric: Normal mood and affect.   Pertinent Imaging: Ultrasound images were personally reviewed and interpreted  US  RENAL  Narrative CLINICAL DATA:  worsening kidney funtion.  EXAM: RENAL / URINARY TRACT ULTRASOUND COMPLETE  COMPARISON:  None Available.  FINDINGS: Right Kidney:  Renal measurements: 5.7 x 5.9 x 10.6 cm = volume: 185 mL. Echogenicity within normal limits. No mass or hydronephrosis visualized.  Left Kidney:  Renal measurements: 5.2 x 6.0 x 12.5 cm = volume: 204 mL. Echogenicity within normal limits. No mass or hydronephrosis visualized. A simple cyst noted measuring 1.6  x 1.9 cm in the upper pole.  Bladder:  Appears normal for degree of bladder distention.  Other:  None.  IMPRESSION: Essentially unremarkable renal ultrasound examination.   Electronically Signed By: Ree Molt M.D. On: 11/08/2023 15:05   Assessment & Plan:    1.  Hypotonic bladder/urinary retention Continue CIC Pyuria is common in patient is on CIC and no further evaluation recommended Treatment of asymptomatic bacteriuria is not recommended Follow-up annually and instructed call earlier for symptomatic UTI   Glendia JAYSON Barba, MD  [facility] [address], [address] [place], [place] [address] [phone]

## 2024-06-18 ENCOUNTER — Ambulatory Visit: Admitting: Dermatology

## 2025-01-22 ENCOUNTER — Ambulatory Visit: Admitting: Urology
